# Patient Record
Sex: Female | Born: 1940
Health system: Southern US, Community
[De-identification: ages and names within clinical notes are randomized; demographics above are authoritative.]

## PROBLEM LIST (undated history)

## (undated) DIAGNOSIS — K449 Diaphragmatic hernia without obstruction or gangrene: Secondary | ICD-10-CM

## (undated) DIAGNOSIS — G35 Multiple sclerosis: Secondary | ICD-10-CM

## (undated) HISTORY — DX: Diaphragmatic hernia without obstruction or gangrene: K44.9

## (undated) HISTORY — PX: TONSILLECTOMY: SUR1361

## (undated) HISTORY — PX: TOTAL VAGINAL HYSTERECTOMY: SHX2548

## (undated) HISTORY — PX: WISDOM TOOTH EXTRACTION: SHX21

---

## 1973-01-17 HISTORY — PX: BREAST ENHANCEMENT SURGERY: SHX7

## 1986-01-17 DIAGNOSIS — G35 Multiple sclerosis: Secondary | ICD-10-CM

## 1986-01-17 HISTORY — DX: Multiple sclerosis: G35

## 1999-11-25 ENCOUNTER — Other Ambulatory Visit: Admission: RE | Admit: 1999-11-25 | Discharge: 1999-11-25 | Payer: Self-pay | Admitting: Obstetrics and Gynecology

## 2001-04-19 ENCOUNTER — Other Ambulatory Visit: Admission: RE | Admit: 2001-04-19 | Discharge: 2001-04-19 | Payer: Self-pay | Admitting: Obstetrics and Gynecology

## 2001-05-18 ENCOUNTER — Encounter: Payer: Self-pay | Admitting: Obstetrics and Gynecology

## 2001-05-18 ENCOUNTER — Ambulatory Visit (HOSPITAL_COMMUNITY): Admission: RE | Admit: 2001-05-18 | Discharge: 2001-05-18 | Payer: Self-pay | Admitting: Obstetrics and Gynecology

## 2001-06-22 ENCOUNTER — Encounter (INDEPENDENT_AMBULATORY_CARE_PROVIDER_SITE_OTHER): Payer: Self-pay | Admitting: Specialist

## 2001-06-22 ENCOUNTER — Ambulatory Visit (HOSPITAL_COMMUNITY): Admission: RE | Admit: 2001-06-22 | Discharge: 2001-06-22 | Payer: Self-pay | Admitting: Obstetrics and Gynecology

## 2002-06-04 ENCOUNTER — Other Ambulatory Visit: Admission: RE | Admit: 2002-06-04 | Discharge: 2002-06-04 | Payer: Self-pay | Admitting: Obstetrics and Gynecology

## 2003-09-15 ENCOUNTER — Other Ambulatory Visit: Admission: RE | Admit: 2003-09-15 | Discharge: 2003-09-15 | Payer: Self-pay | Admitting: Obstetrics and Gynecology

## 2004-10-05 ENCOUNTER — Other Ambulatory Visit: Admission: RE | Admit: 2004-10-05 | Discharge: 2004-10-05 | Payer: Self-pay | Admitting: Obstetrics and Gynecology

## 2007-11-29 ENCOUNTER — Encounter: Admission: RE | Admit: 2007-11-29 | Discharge: 2008-01-21 | Payer: Self-pay | Admitting: Internal Medicine

## 2008-01-21 ENCOUNTER — Encounter: Admission: RE | Admit: 2008-01-21 | Discharge: 2008-02-21 | Payer: Self-pay | Admitting: Internal Medicine

## 2010-06-01 ENCOUNTER — Other Ambulatory Visit (HOSPITAL_COMMUNITY): Payer: Self-pay | Admitting: Psychiatry

## 2010-06-01 DIAGNOSIS — G35 Multiple sclerosis: Secondary | ICD-10-CM

## 2010-06-04 NOTE — H&P (Signed)
[facility]  Patient:    Suzanne Kim, Suzanne Kim Visit Number: [id number] MRN:           Service Type: Attending:  Trevor Iha, M.D. Dictated by:   Trevor Iha, M.D. Adm. Date:  06/22/01                           History and Physical  DATE OF BIRTH:  1940/10/22.  HISTORY OF PRESENT ILLNESS:  The patient is a 70 year old ______ gravida white female who has a history of hysterectomy in 1991 for bleeding who had a routine annual examination that had pelvic fullness.  Underwent a ultrasound and CAT scan which showed bilateral ovarian cyst with cystic and solid components.  She had a normal CA-125.  The left adnexa shows a cyst that is 4.3 x 2.5 x 4.3 cm in size with a 2 x 1.3 x 1.4 cm cyst with cystic and solid components.  The right ovary measures 5 cm in size with a 2.8 had a 1.5 cm cyst.  Both have solid components.  No adnexal fluid is noted. the uterus is surgically missing.  The MRI is consistent with the ultrasound.  No pelvic nodes or other masses are noted.   Plan to proceed with laparoscopy for removal of the tubes and ovaries.  The patient has mostly had pressure symptoms from this.  PAST MEDICAL HISTORY: 1. Significant for lupus being treated by a doctor in [place].  She is on    Plaquenil, Buspar, Clonopin, detrol LA. 2. She also has a history of osteopenia currently on Evista.  PAST SURGICAL HISTORY: 1. Hysterectomy in 1991. 2. Breast implants in 1998.  PHYSICAL EXAMINATION:  VITAL SIGNS:  Blood pressure 112/70, weight 137.  HEART:  Regular rate and rhythm.  LUNGS:  Clear to auscultation bilaterally.  ABDOMEN:  Nondistended, nontender.  PELVIC:  Mild bilateral pelvic fullness.  No obvious masses are palpable.  IMPRESSION AND PLAN:  Bilateral ovarian cyst with cystic and solid components.  PLAN:  Laparoscopic bilateral salpingo-oophorectomy with pelvic washings.  We will plan to begin laparoscopically.  If it  does appear to be cancerous we will close and perform a laparotomy for staging with a GYN oncologist.  This has been discussed with Dr. Rande Brunt. Clarke-Pearson, [facility] oncologist, and feels that we can safely proceed laparoscopically at this time due to a normal CA-125 and the MRI and ultrasound.  The risks and benefits were discussED at length with the patient which included but are not limited to risk of infection, bleeding, damage to bowel, bladder, ureters, and also need for further surgery.  Risks associated with blood transfusion and also with anesthesia.  She does give her informed consent. Dictated by:   Trevor Iha, M.D. Attending:  Trevor Iha, M.D. DD:  06/21/01 TD:  06/21/01 Job: [id number]

## 2010-06-04 NOTE — Op Note (Signed)
[facility]  Patient:    Suzanne Kim, Suzanne Kim Visit Number: [id number] MRN: [id number]          Service Type: DSU Location: [facility] Attending Physician:  Trevor Iha Dictated by:   Trevor Iha, M.D. Proc. Date: 06/22/01 Admit Date:  06/22/2001 Discharge Date: 06/22/2001                             Operative Report  PREOPERATIVE DIAGNOSES:       Bilateral ovarian masses.  POSTOPERATIVE DIAGNOSES:      Bilateral ovarian papillary serous cyst adenofibromas.  PROCEDURE:                    Laparoscopic bilateral salpingo-oophorectomy.  SURGEON:                      Trevor Iha, M.D.  ANESTHESIA:                   General endotracheal.  INDICATIONS:                  Suzanne Kim is a 70 year old nulligravida white female history of hysterectomy in 1991 for bleeding who at a routine annual examination had pelvic fullness.  Underwent ultrasound and a CAT scan which showed bilateral ovarian cysts with cystic and solid components.  She had a normal CA-125.  She presents today for bilateral salpingo-oophorectomy.  Risks and benefits were discussed at length.  Informed consent was obtained.  The findings were bilateral ovarian cysts with papillary excrescences.  Frozen pathology shows bilateral papillary serous cyst adenofibromas.  Otherwise, had a normal appearing cul-de-sac, normal appearing appendix and liver.  DESCRIPTION OF PROCEDURE:     After adequate analgesia, the patient placed in the dorsal lithotomy position.  She is sterilely prepped and draped.  The bladder was sterilely drained.  A sponge stitch was placed in the vagina.  A 1 cm infraumbilical skin incision was made.  A Veress needle was inserted.  The abdomen was insufflated to dullness in percussion.  An 11 mm trocar was inserted.  A 5 mm trocar was inserted to the left of midline two fingerbreadths above the pubic symphysis and the above findings were noted. The Gyrus  bipolar scissors were used to endoscopically cauterize and dissect the right and left ovary and tube off the infundibulopelvic ligament, down to the pelvic side wall, and off the vaginal cuff with care taken to avoid the underlying ureter and bladder and achieve good hemostasis along the way.  After the ovaries were removed and the remaining pedicles were hemostatic, each ovary was individually placed in an endoscopic bag.  The 5 mm port was then enlarged to approximately 1.5 cm and the ovaries were morcellated while in the endoscopic bag and delivered through the abdominal incision.  They were sent for frozen pathology.  At this time after reexamination revealed good hemostasis, the trocars were then removed.  The fascia on the extended 5 mm ports were grasped with Allis clamps and closed with 0 Vicryl in the fascia, 3-0 Vicryl Rapide subcuticular stitch.  The infraumbilical skin incision was closed with a 0 Vicryl fascia and 3-0 Vicryl Rapide subcuticular stitch.  The incisions were injected with 0.25% Marcaine. Sponge stick was removed from the vagina.  The patient was stable on transfer to the recovery room.  Sponge and instrument counts were normal x3.  Frozen pathology,  again, was benign.  Pelvic washings had been obtained.  DISPOSITION:                  Patient will be discharged home with routine follow-up in the office in two to three weeks.  She is given a prescription for Motrin 800 mg and Vicodin #30.  CONDITION ON DISCHARGE:       Good. Dictated by:   Trevor Iha, M.D. Attending Physician:  Trevor Iha DD:  06/22/01 TD:  06/25/01 Job: [phone]

## 2010-06-21 ENCOUNTER — Ambulatory Visit (HOSPITAL_COMMUNITY)
Admission: RE | Admit: 2010-06-21 | Discharge: 2010-06-21 | Disposition: A | Discharge status: Home / Self Care | Payer: Medicare Other | Source: Ambulatory Visit | Attending: Psychiatry | Admitting: Psychiatry

## 2010-06-21 DIAGNOSIS — G35 Multiple sclerosis: Secondary | ICD-10-CM | POA: Insufficient documentation

## 2010-06-21 DIAGNOSIS — M519 Unspecified thoracic, thoracolumbar and lumbosacral intervertebral disc disorder: Secondary | ICD-10-CM | POA: Insufficient documentation

## 2010-06-21 DIAGNOSIS — M439 Deforming dorsopathy, unspecified: Secondary | ICD-10-CM | POA: Insufficient documentation

## 2010-06-21 DIAGNOSIS — M545 Low back pain, unspecified: Secondary | ICD-10-CM | POA: Insufficient documentation

## 2010-06-21 DIAGNOSIS — M5126 Other intervertebral disc displacement, lumbar region: Secondary | ICD-10-CM | POA: Insufficient documentation

## 2010-06-21 MED ORDER — GADOBENATE DIMEGLUMINE 529 MG/ML IV SOLN
10.0000 mL | Freq: Once | INTRAVENOUS | Status: AC
  Administered 2010-06-21: 10 mL via INTRAVENOUS

## 2010-06-22 ENCOUNTER — Other Ambulatory Visit (HOSPITAL_COMMUNITY): Payer: Self-pay

## 2011-01-24 DIAGNOSIS — H1044 Vernal conjunctivitis: Secondary | ICD-10-CM | POA: Diagnosis not present

## 2011-01-25 DIAGNOSIS — N3941 Urge incontinence: Secondary | ICD-10-CM | POA: Diagnosis not present

## 2011-01-25 DIAGNOSIS — R350 Frequency of micturition: Secondary | ICD-10-CM | POA: Diagnosis not present

## 2011-02-15 DIAGNOSIS — N3941 Urge incontinence: Secondary | ICD-10-CM | POA: Diagnosis not present

## 2011-02-15 DIAGNOSIS — R350 Frequency of micturition: Secondary | ICD-10-CM | POA: Diagnosis not present

## 2011-03-08 DIAGNOSIS — R350 Frequency of micturition: Secondary | ICD-10-CM | POA: Diagnosis not present

## 2011-03-08 DIAGNOSIS — N3941 Urge incontinence: Secondary | ICD-10-CM | POA: Diagnosis not present

## 2011-03-21 DIAGNOSIS — N8189 Other female genital prolapse: Secondary | ICD-10-CM | POA: Diagnosis not present

## 2011-03-24 DIAGNOSIS — H02829 Cysts of unspecified eye, unspecified eyelid: Secondary | ICD-10-CM | POA: Diagnosis not present

## 2011-03-28 DIAGNOSIS — N3941 Urge incontinence: Secondary | ICD-10-CM | POA: Diagnosis not present

## 2011-03-28 DIAGNOSIS — R350 Frequency of micturition: Secondary | ICD-10-CM | POA: Diagnosis not present

## 2011-03-29 DIAGNOSIS — M329 Systemic lupus erythematosus, unspecified: Secondary | ICD-10-CM | POA: Diagnosis not present

## 2011-03-29 DIAGNOSIS — M545 Low back pain: Secondary | ICD-10-CM | POA: Diagnosis not present

## 2011-03-29 DIAGNOSIS — J309 Allergic rhinitis, unspecified: Secondary | ICD-10-CM | POA: Diagnosis not present

## 2011-03-29 DIAGNOSIS — M1990 Unspecified osteoarthritis, unspecified site: Secondary | ICD-10-CM | POA: Diagnosis not present

## 2011-04-04 DIAGNOSIS — IMO0002 Reserved for concepts with insufficient information to code with codable children: Secondary | ICD-10-CM | POA: Diagnosis not present

## 2011-04-04 DIAGNOSIS — R262 Difficulty in walking, not elsewhere classified: Secondary | ICD-10-CM | POA: Diagnosis not present

## 2011-04-04 DIAGNOSIS — G35 Multiple sclerosis: Secondary | ICD-10-CM | POA: Diagnosis not present

## 2011-04-04 DIAGNOSIS — M545 Low back pain: Secondary | ICD-10-CM | POA: Diagnosis not present

## 2011-04-04 DIAGNOSIS — M439 Deforming dorsopathy, unspecified: Secondary | ICD-10-CM | POA: Diagnosis not present

## 2011-04-05 DIAGNOSIS — M439 Deforming dorsopathy, unspecified: Secondary | ICD-10-CM | POA: Diagnosis not present

## 2011-04-05 DIAGNOSIS — M545 Low back pain: Secondary | ICD-10-CM | POA: Diagnosis not present

## 2011-04-05 DIAGNOSIS — R262 Difficulty in walking, not elsewhere classified: Secondary | ICD-10-CM | POA: Diagnosis not present

## 2011-04-05 DIAGNOSIS — G35 Multiple sclerosis: Secondary | ICD-10-CM | POA: Diagnosis not present

## 2011-04-05 DIAGNOSIS — IMO0002 Reserved for concepts with insufficient information to code with codable children: Secondary | ICD-10-CM | POA: Diagnosis not present

## 2011-04-06 DIAGNOSIS — M439 Deforming dorsopathy, unspecified: Secondary | ICD-10-CM | POA: Diagnosis not present

## 2011-04-06 DIAGNOSIS — M545 Low back pain: Secondary | ICD-10-CM | POA: Diagnosis not present

## 2011-04-06 DIAGNOSIS — IMO0002 Reserved for concepts with insufficient information to code with codable children: Secondary | ICD-10-CM | POA: Diagnosis not present

## 2011-04-06 DIAGNOSIS — G35 Multiple sclerosis: Secondary | ICD-10-CM | POA: Diagnosis not present

## 2011-04-06 DIAGNOSIS — R262 Difficulty in walking, not elsewhere classified: Secondary | ICD-10-CM | POA: Diagnosis not present

## 2011-04-07 DIAGNOSIS — M545 Low back pain: Secondary | ICD-10-CM | POA: Diagnosis not present

## 2011-04-07 DIAGNOSIS — IMO0002 Reserved for concepts with insufficient information to code with codable children: Secondary | ICD-10-CM | POA: Diagnosis not present

## 2011-04-07 DIAGNOSIS — M439 Deforming dorsopathy, unspecified: Secondary | ICD-10-CM | POA: Diagnosis not present

## 2011-04-07 DIAGNOSIS — G35 Multiple sclerosis: Secondary | ICD-10-CM | POA: Diagnosis not present

## 2011-04-07 DIAGNOSIS — R262 Difficulty in walking, not elsewhere classified: Secondary | ICD-10-CM | POA: Diagnosis not present

## 2011-04-08 DIAGNOSIS — M439 Deforming dorsopathy, unspecified: Secondary | ICD-10-CM | POA: Diagnosis not present

## 2011-04-08 DIAGNOSIS — IMO0002 Reserved for concepts with insufficient information to code with codable children: Secondary | ICD-10-CM | POA: Diagnosis not present

## 2011-04-08 DIAGNOSIS — M545 Low back pain: Secondary | ICD-10-CM | POA: Diagnosis not present

## 2011-04-08 DIAGNOSIS — R262 Difficulty in walking, not elsewhere classified: Secondary | ICD-10-CM | POA: Diagnosis not present

## 2011-04-08 DIAGNOSIS — G35 Multiple sclerosis: Secondary | ICD-10-CM | POA: Diagnosis not present

## 2011-04-11 DIAGNOSIS — R262 Difficulty in walking, not elsewhere classified: Secondary | ICD-10-CM | POA: Diagnosis not present

## 2011-04-11 DIAGNOSIS — M545 Low back pain: Secondary | ICD-10-CM | POA: Diagnosis not present

## 2011-04-11 DIAGNOSIS — IMO0002 Reserved for concepts with insufficient information to code with codable children: Secondary | ICD-10-CM | POA: Diagnosis not present

## 2011-04-11 DIAGNOSIS — M439 Deforming dorsopathy, unspecified: Secondary | ICD-10-CM | POA: Diagnosis not present

## 2011-04-11 DIAGNOSIS — G35 Multiple sclerosis: Secondary | ICD-10-CM | POA: Diagnosis not present

## 2011-04-12 DIAGNOSIS — G35 Multiple sclerosis: Secondary | ICD-10-CM | POA: Diagnosis not present

## 2011-04-12 DIAGNOSIS — M545 Low back pain: Secondary | ICD-10-CM | POA: Diagnosis not present

## 2011-04-12 DIAGNOSIS — M439 Deforming dorsopathy, unspecified: Secondary | ICD-10-CM | POA: Diagnosis not present

## 2011-04-12 DIAGNOSIS — IMO0002 Reserved for concepts with insufficient information to code with codable children: Secondary | ICD-10-CM | POA: Diagnosis not present

## 2011-04-12 DIAGNOSIS — R262 Difficulty in walking, not elsewhere classified: Secondary | ICD-10-CM | POA: Diagnosis not present

## 2011-04-13 DIAGNOSIS — R262 Difficulty in walking, not elsewhere classified: Secondary | ICD-10-CM | POA: Diagnosis not present

## 2011-04-13 DIAGNOSIS — M545 Low back pain: Secondary | ICD-10-CM | POA: Diagnosis not present

## 2011-04-13 DIAGNOSIS — G35 Multiple sclerosis: Secondary | ICD-10-CM | POA: Diagnosis not present

## 2011-04-13 DIAGNOSIS — IMO0002 Reserved for concepts with insufficient information to code with codable children: Secondary | ICD-10-CM | POA: Diagnosis not present

## 2011-04-13 DIAGNOSIS — M439 Deforming dorsopathy, unspecified: Secondary | ICD-10-CM | POA: Diagnosis not present

## 2011-04-14 DIAGNOSIS — G35 Multiple sclerosis: Secondary | ICD-10-CM | POA: Diagnosis not present

## 2011-04-14 DIAGNOSIS — M545 Low back pain: Secondary | ICD-10-CM | POA: Diagnosis not present

## 2011-04-14 DIAGNOSIS — M439 Deforming dorsopathy, unspecified: Secondary | ICD-10-CM | POA: Diagnosis not present

## 2011-04-14 DIAGNOSIS — R262 Difficulty in walking, not elsewhere classified: Secondary | ICD-10-CM | POA: Diagnosis not present

## 2011-04-14 DIAGNOSIS — IMO0002 Reserved for concepts with insufficient information to code with codable children: Secondary | ICD-10-CM | POA: Diagnosis not present

## 2011-04-18 DIAGNOSIS — R262 Difficulty in walking, not elsewhere classified: Secondary | ICD-10-CM | POA: Diagnosis not present

## 2011-04-18 DIAGNOSIS — M439 Deforming dorsopathy, unspecified: Secondary | ICD-10-CM | POA: Diagnosis not present

## 2011-04-18 DIAGNOSIS — M545 Low back pain: Secondary | ICD-10-CM | POA: Diagnosis not present

## 2011-04-18 DIAGNOSIS — IMO0002 Reserved for concepts with insufficient information to code with codable children: Secondary | ICD-10-CM | POA: Diagnosis not present

## 2011-04-18 DIAGNOSIS — G35 Multiple sclerosis: Secondary | ICD-10-CM | POA: Diagnosis not present

## 2011-04-19 DIAGNOSIS — R350 Frequency of micturition: Secondary | ICD-10-CM | POA: Diagnosis not present

## 2011-04-19 DIAGNOSIS — N3941 Urge incontinence: Secondary | ICD-10-CM | POA: Diagnosis not present

## 2011-04-20 DIAGNOSIS — G35 Multiple sclerosis: Secondary | ICD-10-CM | POA: Diagnosis not present

## 2011-04-21 DIAGNOSIS — G35 Multiple sclerosis: Secondary | ICD-10-CM | POA: Diagnosis not present

## 2011-04-21 DIAGNOSIS — M545 Low back pain: Secondary | ICD-10-CM | POA: Diagnosis not present

## 2011-04-21 DIAGNOSIS — IMO0002 Reserved for concepts with insufficient information to code with codable children: Secondary | ICD-10-CM | POA: Diagnosis not present

## 2011-04-21 DIAGNOSIS — M439 Deforming dorsopathy, unspecified: Secondary | ICD-10-CM | POA: Diagnosis not present

## 2011-04-21 DIAGNOSIS — R262 Difficulty in walking, not elsewhere classified: Secondary | ICD-10-CM | POA: Diagnosis not present

## 2011-04-22 DIAGNOSIS — IMO0002 Reserved for concepts with insufficient information to code with codable children: Secondary | ICD-10-CM | POA: Diagnosis not present

## 2011-04-22 DIAGNOSIS — G35 Multiple sclerosis: Secondary | ICD-10-CM | POA: Diagnosis not present

## 2011-04-22 DIAGNOSIS — M545 Low back pain: Secondary | ICD-10-CM | POA: Diagnosis not present

## 2011-04-22 DIAGNOSIS — M439 Deforming dorsopathy, unspecified: Secondary | ICD-10-CM | POA: Diagnosis not present

## 2011-04-22 DIAGNOSIS — R262 Difficulty in walking, not elsewhere classified: Secondary | ICD-10-CM | POA: Diagnosis not present

## 2011-04-25 DIAGNOSIS — M545 Low back pain: Secondary | ICD-10-CM | POA: Diagnosis not present

## 2011-04-25 DIAGNOSIS — M439 Deforming dorsopathy, unspecified: Secondary | ICD-10-CM | POA: Diagnosis not present

## 2011-04-25 DIAGNOSIS — G35 Multiple sclerosis: Secondary | ICD-10-CM | POA: Diagnosis not present

## 2011-04-25 DIAGNOSIS — IMO0002 Reserved for concepts with insufficient information to code with codable children: Secondary | ICD-10-CM | POA: Diagnosis not present

## 2011-04-25 DIAGNOSIS — R262 Difficulty in walking, not elsewhere classified: Secondary | ICD-10-CM | POA: Diagnosis not present

## 2011-04-27 DIAGNOSIS — R262 Difficulty in walking, not elsewhere classified: Secondary | ICD-10-CM | POA: Diagnosis not present

## 2011-04-27 DIAGNOSIS — G35 Multiple sclerosis: Secondary | ICD-10-CM | POA: Diagnosis not present

## 2011-04-27 DIAGNOSIS — IMO0002 Reserved for concepts with insufficient information to code with codable children: Secondary | ICD-10-CM | POA: Diagnosis not present

## 2011-04-27 DIAGNOSIS — M439 Deforming dorsopathy, unspecified: Secondary | ICD-10-CM | POA: Diagnosis not present

## 2011-04-27 DIAGNOSIS — M545 Low back pain: Secondary | ICD-10-CM | POA: Diagnosis not present

## 2011-04-28 DIAGNOSIS — IMO0002 Reserved for concepts with insufficient information to code with codable children: Secondary | ICD-10-CM | POA: Diagnosis not present

## 2011-04-28 DIAGNOSIS — M439 Deforming dorsopathy, unspecified: Secondary | ICD-10-CM | POA: Diagnosis not present

## 2011-04-28 DIAGNOSIS — M545 Low back pain: Secondary | ICD-10-CM | POA: Diagnosis not present

## 2011-04-28 DIAGNOSIS — G35 Multiple sclerosis: Secondary | ICD-10-CM | POA: Diagnosis not present

## 2011-04-28 DIAGNOSIS — R262 Difficulty in walking, not elsewhere classified: Secondary | ICD-10-CM | POA: Diagnosis not present

## 2011-05-02 DIAGNOSIS — M439 Deforming dorsopathy, unspecified: Secondary | ICD-10-CM | POA: Diagnosis not present

## 2011-05-02 DIAGNOSIS — IMO0002 Reserved for concepts with insufficient information to code with codable children: Secondary | ICD-10-CM | POA: Diagnosis not present

## 2011-05-02 DIAGNOSIS — G35 Multiple sclerosis: Secondary | ICD-10-CM | POA: Diagnosis not present

## 2011-05-02 DIAGNOSIS — R262 Difficulty in walking, not elsewhere classified: Secondary | ICD-10-CM | POA: Diagnosis not present

## 2011-05-02 DIAGNOSIS — M545 Low back pain: Secondary | ICD-10-CM | POA: Diagnosis not present

## 2011-05-04 DIAGNOSIS — M810 Age-related osteoporosis without current pathological fracture: Secondary | ICD-10-CM | POA: Diagnosis not present

## 2011-05-04 DIAGNOSIS — Z79899 Other long term (current) drug therapy: Secondary | ICD-10-CM | POA: Diagnosis not present

## 2011-05-04 DIAGNOSIS — M1990 Unspecified osteoarthritis, unspecified site: Secondary | ICD-10-CM | POA: Diagnosis not present

## 2011-05-04 DIAGNOSIS — N318 Other neuromuscular dysfunction of bladder: Secondary | ICD-10-CM | POA: Diagnosis not present

## 2011-05-05 DIAGNOSIS — M545 Low back pain: Secondary | ICD-10-CM | POA: Diagnosis not present

## 2011-05-05 DIAGNOSIS — G35 Multiple sclerosis: Secondary | ICD-10-CM | POA: Diagnosis not present

## 2011-05-05 DIAGNOSIS — IMO0002 Reserved for concepts with insufficient information to code with codable children: Secondary | ICD-10-CM | POA: Diagnosis not present

## 2011-05-05 DIAGNOSIS — M439 Deforming dorsopathy, unspecified: Secondary | ICD-10-CM | POA: Diagnosis not present

## 2011-05-05 DIAGNOSIS — R262 Difficulty in walking, not elsewhere classified: Secondary | ICD-10-CM | POA: Diagnosis not present

## 2011-05-10 DIAGNOSIS — N3941 Urge incontinence: Secondary | ICD-10-CM | POA: Diagnosis not present

## 2011-05-11 DIAGNOSIS — M439 Deforming dorsopathy, unspecified: Secondary | ICD-10-CM | POA: Diagnosis not present

## 2011-05-11 DIAGNOSIS — IMO0002 Reserved for concepts with insufficient information to code with codable children: Secondary | ICD-10-CM | POA: Diagnosis not present

## 2011-05-11 DIAGNOSIS — R262 Difficulty in walking, not elsewhere classified: Secondary | ICD-10-CM | POA: Diagnosis not present

## 2011-05-11 DIAGNOSIS — M545 Low back pain: Secondary | ICD-10-CM | POA: Diagnosis not present

## 2011-05-11 DIAGNOSIS — G35 Multiple sclerosis: Secondary | ICD-10-CM | POA: Diagnosis not present

## 2011-05-12 DIAGNOSIS — IMO0002 Reserved for concepts with insufficient information to code with codable children: Secondary | ICD-10-CM | POA: Diagnosis not present

## 2011-05-12 DIAGNOSIS — M439 Deforming dorsopathy, unspecified: Secondary | ICD-10-CM | POA: Diagnosis not present

## 2011-05-12 DIAGNOSIS — R262 Difficulty in walking, not elsewhere classified: Secondary | ICD-10-CM | POA: Diagnosis not present

## 2011-05-12 DIAGNOSIS — G35 Multiple sclerosis: Secondary | ICD-10-CM | POA: Diagnosis not present

## 2011-05-12 DIAGNOSIS — M545 Low back pain: Secondary | ICD-10-CM | POA: Diagnosis not present

## 2011-05-31 DIAGNOSIS — R350 Frequency of micturition: Secondary | ICD-10-CM | POA: Diagnosis not present

## 2011-05-31 DIAGNOSIS — N3941 Urge incontinence: Secondary | ICD-10-CM | POA: Diagnosis not present

## 2011-06-14 DIAGNOSIS — M810 Age-related osteoporosis without current pathological fracture: Secondary | ICD-10-CM | POA: Diagnosis not present

## 2011-06-14 DIAGNOSIS — Z01419 Encounter for gynecological examination (general) (routine) without abnormal findings: Secondary | ICD-10-CM | POA: Diagnosis not present

## 2011-06-14 DIAGNOSIS — N8189 Other female genital prolapse: Secondary | ICD-10-CM | POA: Diagnosis not present

## 2011-06-21 DIAGNOSIS — N3941 Urge incontinence: Secondary | ICD-10-CM | POA: Diagnosis not present

## 2011-06-21 DIAGNOSIS — R350 Frequency of micturition: Secondary | ICD-10-CM | POA: Diagnosis not present

## 2011-08-02 DIAGNOSIS — R350 Frequency of micturition: Secondary | ICD-10-CM | POA: Diagnosis not present

## 2011-08-02 DIAGNOSIS — N3941 Urge incontinence: Secondary | ICD-10-CM | POA: Diagnosis not present

## 2011-08-16 DIAGNOSIS — N8111 Cystocele, midline: Secondary | ICD-10-CM | POA: Diagnosis not present

## 2011-08-23 DIAGNOSIS — N3941 Urge incontinence: Secondary | ICD-10-CM | POA: Diagnosis not present

## 2011-09-13 DIAGNOSIS — R350 Frequency of micturition: Secondary | ICD-10-CM | POA: Diagnosis not present

## 2011-09-13 DIAGNOSIS — N3941 Urge incontinence: Secondary | ICD-10-CM | POA: Diagnosis not present

## 2011-10-06 DIAGNOSIS — N3941 Urge incontinence: Secondary | ICD-10-CM | POA: Diagnosis not present

## 2011-10-06 DIAGNOSIS — R350 Frequency of micturition: Secondary | ICD-10-CM | POA: Diagnosis not present

## 2011-10-26 DIAGNOSIS — R350 Frequency of micturition: Secondary | ICD-10-CM | POA: Diagnosis not present

## 2011-10-26 DIAGNOSIS — N3941 Urge incontinence: Secondary | ICD-10-CM | POA: Diagnosis not present

## 2011-11-01 DIAGNOSIS — N8189 Other female genital prolapse: Secondary | ICD-10-CM | POA: Diagnosis not present

## 2011-11-08 DIAGNOSIS — G35 Multiple sclerosis: Secondary | ICD-10-CM | POA: Diagnosis not present

## 2011-11-09 DIAGNOSIS — Z23 Encounter for immunization: Secondary | ICD-10-CM | POA: Diagnosis not present

## 2011-11-09 DIAGNOSIS — G35 Multiple sclerosis: Secondary | ICD-10-CM | POA: Diagnosis not present

## 2011-11-09 DIAGNOSIS — M329 Systemic lupus erythematosus, unspecified: Secondary | ICD-10-CM | POA: Diagnosis not present

## 2011-11-09 DIAGNOSIS — Z79899 Other long term (current) drug therapy: Secondary | ICD-10-CM | POA: Diagnosis not present

## 2011-11-09 DIAGNOSIS — M545 Low back pain: Secondary | ICD-10-CM | POA: Diagnosis not present

## 2011-11-09 DIAGNOSIS — M810 Age-related osteoporosis without current pathological fracture: Secondary | ICD-10-CM | POA: Diagnosis not present

## 2011-11-09 DIAGNOSIS — J309 Allergic rhinitis, unspecified: Secondary | ICD-10-CM | POA: Diagnosis not present

## 2011-11-09 DIAGNOSIS — M1990 Unspecified osteoarthritis, unspecified site: Secondary | ICD-10-CM | POA: Diagnosis not present

## 2011-11-09 DIAGNOSIS — Z1331 Encounter for screening for depression: Secondary | ICD-10-CM | POA: Diagnosis not present

## 2011-11-16 DIAGNOSIS — N3941 Urge incontinence: Secondary | ICD-10-CM | POA: Diagnosis not present

## 2011-11-16 DIAGNOSIS — R350 Frequency of micturition: Secondary | ICD-10-CM | POA: Diagnosis not present

## 2011-12-07 DIAGNOSIS — N3941 Urge incontinence: Secondary | ICD-10-CM | POA: Diagnosis not present

## 2011-12-07 DIAGNOSIS — R350 Frequency of micturition: Secondary | ICD-10-CM | POA: Diagnosis not present

## 2011-12-28 DIAGNOSIS — N3941 Urge incontinence: Secondary | ICD-10-CM | POA: Diagnosis not present

## 2012-01-31 DIAGNOSIS — R350 Frequency of micturition: Secondary | ICD-10-CM | POA: Diagnosis not present

## 2012-01-31 DIAGNOSIS — N3941 Urge incontinence: Secondary | ICD-10-CM | POA: Diagnosis not present

## 2012-02-01 DIAGNOSIS — N8182 Incompetence or weakening of pubocervical tissue: Secondary | ICD-10-CM | POA: Diagnosis not present

## 2012-02-21 DIAGNOSIS — N3941 Urge incontinence: Secondary | ICD-10-CM | POA: Diagnosis not present

## 2012-03-13 DIAGNOSIS — N3941 Urge incontinence: Secondary | ICD-10-CM | POA: Diagnosis not present

## 2012-04-17 DIAGNOSIS — N3941 Urge incontinence: Secondary | ICD-10-CM | POA: Diagnosis not present

## 2012-04-17 DIAGNOSIS — R350 Frequency of micturition: Secondary | ICD-10-CM | POA: Diagnosis not present

## 2012-04-30 DIAGNOSIS — N8182 Incompetence or weakening of pubocervical tissue: Secondary | ICD-10-CM | POA: Diagnosis not present

## 2012-04-30 DIAGNOSIS — N952 Postmenopausal atrophic vaginitis: Secondary | ICD-10-CM | POA: Diagnosis not present

## 2012-05-09 DIAGNOSIS — IMO0002 Reserved for concepts with insufficient information to code with codable children: Secondary | ICD-10-CM | POA: Diagnosis not present

## 2012-05-09 DIAGNOSIS — M329 Systemic lupus erythematosus, unspecified: Secondary | ICD-10-CM | POA: Diagnosis not present

## 2012-05-09 DIAGNOSIS — G35 Multiple sclerosis: Secondary | ICD-10-CM | POA: Diagnosis not present

## 2012-05-09 DIAGNOSIS — Z23 Encounter for immunization: Secondary | ICD-10-CM | POA: Diagnosis not present

## 2012-05-09 DIAGNOSIS — J309 Allergic rhinitis, unspecified: Secondary | ICD-10-CM | POA: Diagnosis not present

## 2012-05-09 DIAGNOSIS — Z79899 Other long term (current) drug therapy: Secondary | ICD-10-CM | POA: Diagnosis not present

## 2012-05-09 DIAGNOSIS — N318 Other neuromuscular dysfunction of bladder: Secondary | ICD-10-CM | POA: Diagnosis not present

## 2012-05-09 DIAGNOSIS — M1990 Unspecified osteoarthritis, unspecified site: Secondary | ICD-10-CM | POA: Diagnosis not present

## 2012-05-09 DIAGNOSIS — M545 Low back pain: Secondary | ICD-10-CM | POA: Diagnosis not present

## 2012-05-15 DIAGNOSIS — R350 Frequency of micturition: Secondary | ICD-10-CM | POA: Diagnosis not present

## 2012-05-15 DIAGNOSIS — N3941 Urge incontinence: Secondary | ICD-10-CM | POA: Diagnosis not present

## 2012-05-29 DIAGNOSIS — H0019 Chalazion unspecified eye, unspecified eyelid: Secondary | ICD-10-CM | POA: Diagnosis not present

## 2012-06-05 DIAGNOSIS — R350 Frequency of micturition: Secondary | ICD-10-CM | POA: Diagnosis not present

## 2012-06-05 DIAGNOSIS — N3941 Urge incontinence: Secondary | ICD-10-CM | POA: Diagnosis not present

## 2012-06-18 DIAGNOSIS — Z01419 Encounter for gynecological examination (general) (routine) without abnormal findings: Secondary | ICD-10-CM | POA: Diagnosis not present

## 2012-06-27 DIAGNOSIS — N3941 Urge incontinence: Secondary | ICD-10-CM | POA: Diagnosis not present

## 2012-06-27 DIAGNOSIS — R350 Frequency of micturition: Secondary | ICD-10-CM | POA: Diagnosis not present

## 2012-07-10 DIAGNOSIS — M25529 Pain in unspecified elbow: Secondary | ICD-10-CM | POA: Diagnosis not present

## 2012-07-10 DIAGNOSIS — M1990 Unspecified osteoarthritis, unspecified site: Secondary | ICD-10-CM | POA: Diagnosis not present

## 2012-07-10 DIAGNOSIS — IMO0002 Reserved for concepts with insufficient information to code with codable children: Secondary | ICD-10-CM | POA: Diagnosis not present

## 2012-07-10 DIAGNOSIS — G35 Multiple sclerosis: Secondary | ICD-10-CM | POA: Diagnosis not present

## 2012-09-11 DIAGNOSIS — R350 Frequency of micturition: Secondary | ICD-10-CM | POA: Diagnosis not present

## 2012-09-11 DIAGNOSIS — N3941 Urge incontinence: Secondary | ICD-10-CM | POA: Diagnosis not present

## 2012-10-01 DIAGNOSIS — N8182 Incompetence or weakening of pubocervical tissue: Secondary | ICD-10-CM | POA: Diagnosis not present

## 2012-10-02 DIAGNOSIS — R350 Frequency of micturition: Secondary | ICD-10-CM | POA: Diagnosis not present

## 2012-10-02 DIAGNOSIS — N3941 Urge incontinence: Secondary | ICD-10-CM | POA: Diagnosis not present

## 2012-10-23 DIAGNOSIS — N3941 Urge incontinence: Secondary | ICD-10-CM | POA: Diagnosis not present

## 2012-11-07 ENCOUNTER — Encounter (HOSPITAL_COMMUNITY): Payer: Self-pay | Admitting: Emergency Medicine

## 2012-11-07 ENCOUNTER — Emergency Department (INDEPENDENT_AMBULATORY_CARE_PROVIDER_SITE_OTHER)
Admission: EM | Admit: 2012-11-07 | Discharge: 2012-11-07 | Disposition: A | Discharge status: Home / Self Care | Payer: Medicare Other | Source: Home / Self Care | Attending: Family Medicine | Admitting: Family Medicine

## 2012-11-07 DIAGNOSIS — J302 Other seasonal allergic rhinitis: Secondary | ICD-10-CM

## 2012-11-07 DIAGNOSIS — J309 Allergic rhinitis, unspecified: Secondary | ICD-10-CM

## 2012-11-07 HISTORY — DX: Multiple sclerosis: G35

## 2012-11-07 MED ORDER — CETIRIZINE HCL 10 MG PO TABS: 10.0000 mg | ORAL_TABLET | Freq: Every day | ORAL | Status: DC

## 2012-11-07 MED ORDER — FLUTICASONE PROPIONATE 50 MCG/ACT NA SUSP: 2.0000 | Freq: Two times a day (BID) | NASAL | Status: DC

## 2012-11-07 NOTE — ED Provider Notes (Signed)
CSN: [id number]     Arrival date & time 11/07/12  1608 History   First MD Initiated Contact with Patient 11/07/12 1704     Chief Complaint  Patient presents with  . Dizziness   (Consider location/radiation/quality/duration/timing/severity/associated sxs/prior Treatment) Patient is a 72 y.o. female presenting with URI. The history is provided by the patient.  URI Presenting symptoms: congestion, cough and rhinorrhea   Presenting symptoms: no fever and no sore throat   Severity:  Mild Onset quality:  Gradual Duration:  1 week Progression:  Unchanged Chronicity:  New Associated symptoms: sneezing   Associated symptoms: no wheezing     Past Medical History  Diagnosis Date  . Multiple sclerosis    History reviewed. No pertinent past surgical history. History reviewed. No pertinent family history. History  Substance Use Topics  . Smoking status: Never Smoker   . Smokeless tobacco: Not on file  . Alcohol Use: No   OB History   Grav Para Term Preterm Abortions TAB SAB Ect Mult Living                 Review of Systems  Constitutional: Negative.  Negative for fever.  HENT: Positive for congestion, postnasal drip, rhinorrhea and sneezing. Negative for sore throat.   Respiratory: Positive for cough. Negative for wheezing.     Allergies  Review of patient's allergies indicates no known allergies.  Home Medications   Current Outpatient Rx  Name  Route  Sig  Dispense  Refill  . cetirizine (ZYRTEC) 10 MG tablet   Oral   Take 1 tablet (10 mg total) by mouth daily. One tab daily for allergies   30 tablet   1   . fluticasone (FLONASE) 50 MCG/ACT nasal spray   Nasal   Place 2 sprays into the nose 2 (two) times daily.   1 g   2    BP 168/74  Pulse 66  Temp(Src) 98.8 F (37.1 C) (Oral)  Resp 16  SpO2 100% Physical Exam  Nursing note and vitals reviewed. Constitutional: She is oriented to person, place, and time. She appears well-developed and well-nourished.  HENT:   Head: Normocephalic.  Right Ear: External ear normal.  Left Ear: External ear normal.  Nose: Mucosal edema and rhinorrhea present. No epistaxis.  Mouth/Throat: Oropharynx is clear and moist.  Neck: Normal range of motion. Neck supple.  Cardiovascular: Normal rate, regular rhythm, normal heart sounds and intact distal pulses.   Pulmonary/Chest: Effort normal and breath sounds normal.  Lymphadenopathy:    She has no cervical adenopathy.  Neurological: She is alert and oriented to person, place, and time.  Skin: Skin is warm and dry.    ED Course  Procedures (including critical care time) Labs Review Labs Reviewed - No data to display Imaging Review No results found.    MDM      Linna Hoff, MD 11/08/12 [phone]

## 2012-11-07 NOTE — ED Notes (Signed)
History of MS, has a Rx from her MD in [place], who had her take steroids every time her gait was off. States she has had c/o from her family that on the phone, she sounds as if she has a cold or she had been crying; concerned she may have a sinus problem

## 2012-11-15 DIAGNOSIS — Z79899 Other long term (current) drug therapy: Secondary | ICD-10-CM | POA: Diagnosis not present

## 2012-11-15 DIAGNOSIS — M810 Age-related osteoporosis without current pathological fracture: Secondary | ICD-10-CM | POA: Diagnosis not present

## 2012-11-15 DIAGNOSIS — Z23 Encounter for immunization: Secondary | ICD-10-CM | POA: Diagnosis not present

## 2012-11-15 DIAGNOSIS — J309 Allergic rhinitis, unspecified: Secondary | ICD-10-CM | POA: Diagnosis not present

## 2012-11-15 DIAGNOSIS — M1990 Unspecified osteoarthritis, unspecified site: Secondary | ICD-10-CM | POA: Diagnosis not present

## 2012-11-15 DIAGNOSIS — G35 Multiple sclerosis: Secondary | ICD-10-CM | POA: Diagnosis not present

## 2012-11-15 DIAGNOSIS — M545 Low back pain: Secondary | ICD-10-CM | POA: Diagnosis not present

## 2012-11-15 DIAGNOSIS — M329 Systemic lupus erythematosus, unspecified: Secondary | ICD-10-CM | POA: Diagnosis not present

## 2012-11-15 DIAGNOSIS — N318 Other neuromuscular dysfunction of bladder: Secondary | ICD-10-CM | POA: Diagnosis not present

## 2012-12-04 DIAGNOSIS — N3941 Urge incontinence: Secondary | ICD-10-CM | POA: Diagnosis not present

## 2012-12-04 DIAGNOSIS — R350 Frequency of micturition: Secondary | ICD-10-CM | POA: Diagnosis not present

## 2012-12-19 ENCOUNTER — Encounter: Payer: Self-pay | Admitting: Neurology

## 2012-12-19 ENCOUNTER — Ambulatory Visit (INDEPENDENT_AMBULATORY_CARE_PROVIDER_SITE_OTHER): Payer: Medicare Other | Admitting: Neurology

## 2012-12-19 VITALS — BP 140/80 | HR 70 | Temp 98.1°F | Wt 116.0 lb

## 2012-12-19 DIAGNOSIS — G35 Multiple sclerosis: Secondary | ICD-10-CM | POA: Diagnosis not present

## 2012-12-19 NOTE — Progress Notes (Signed)
NEUROLOGY CONSULTATION NOTE  Suzanne Kim MRN: [id number] DOB: [birth year]  Referring provider: Dr. Wylene Simmer Primary care provider: Dr. Wylene Simmer  Reason for consult:  Multiple sclerosis.  HISTORY OF PRESENT ILLNESS: Suzanne Kim is a 72 year old right-handed woman with history of osteoporosis and multiple sclerosis, who presents to establish care for multiple sclerosis.  Records and images were personally reviewed where available.    Onset occurred at age 45.  She describes her initial symptoms as a "gray cloud slowly cover(ing) my vision horizontally".  She would experience these episodes occasionally over the next several years.  In 1986, she had an attack described as numbness from her chest down, as well as weakness in the lower extremities.  At this time, she was evaluated by two neurologists, Dr. Ninetta Lights and Dr. Sandria Manly.  She was told that she did not have MS based on findings on MRI.  The following year, she began stubbing her right toe often.  This progressed to increased difficulty walking, mostly in her right leg, as well as right foot drop.  In 1988, she then saw another neurologist, Dr. Dorina Hoyer, who told her he thought she did have MS, but did not recommend invasive testing, such as an LP.  She was never started on an immunomodulatory agent.  She was prescribed Symatril for fatigue and followed up with Dr. Freida Busman until 1991, when her care was transferred to Dr. Anne Hahn.  Various blood work was ordered, which revealed an elevated ANA of 1280.  [facility]  She underwent multiple tests, including MRI, CT and blood work, and was formally diagnosed with lupus.  She was treated for lupus with Plaquenil from 1991 to 1999.  As her walking continued to progressively worsen, she was Dr. Elwyn Lade at the [facility].  He suspected primary progressive MS. She did finally undergo an LP, as well as MRI.  Results were "inconclusive" but  still diagnosed her with primary progressive MS.  He offered to try Copaxone, but since it is not FDA-approved for PPMS, she would have to pay out of pocket, so she declined.  Regarding the diagnosis of lupus, she was evaluated by two other rheumatologists in [place], who doubted the diagnosis and workup reportedly did not suggest a connective tissue disease.  She eventually had been under the care of Dr. Leotis Shames, an MS specialist at [facility], who has since moved his practice.  Unfortunately, I do not have the results of these tests, such as the CSF or MRI.  Since that time, she has had baseline weakness involving the lower extremities.  Her most recent baseline from one year ago (11/08/11) notes she is ambulatory with a walker, however records note she had been wheelchair-bound prior.  She has assisted devices at home, including a stair lift.  She wears a brace on the right ankle due to a foot drop.  Her graded strength of her iliopsoas muscles at that time were 2/5.  She is not currently on immunomodulatory therapy.  She does take Baclofen 20mg  qhs for leg cramps.  She was taking calcium and vitamin D supplements, but not compliant.  She has history of back pain and degenerative disc disease, as well as osteoarthritis of the hips.  MRI of lumbar spine with and without contrast was performed on 06/21/10, which revealed mild degenerative changes of the discs and facet joints, but no central or neural foraminal stenosis.  She has been treated for  pain in the past with lumbar epidural steroid injections, which were ineffective.  Celebrex and hydrocodone have been ineffective.   She has done quite well with PT in the past.  However, due to her back pain, she has not been performing her home leg exercises.  PAST MEDICAL HISTORY: Past Medical History  Diagnosis Date  . Multiple sclerosis 1988    PAST SURGICAL HISTORY: Past Surgical History  Procedure Laterality Date  . Total vaginal hysterectomy    .  Breast enhancement surgery  1975  . Tonsillectomy      MEDICATIONS: Current Outpatient Prescriptions on File Prior to Visit  Medication Sig Dispense Refill  . cetirizine (ZYRTEC) 10 MG tablet Take 1 tablet (10 mg total) by mouth daily. One tab daily for allergies  30 tablet  1  . fluticasone (FLONASE) 50 MCG/ACT nasal spray Place 2 sprays into the nose 2 (two) times daily.  1 g  2   No current facility-administered medications on file prior to visit.    ALLERGIES: No Known Allergies  FAMILY HISTORY: No family history on file.  SOCIAL HISTORY: History   Social History  . Marital Status: Married    Spouse Name: N/A    Number of Children: N/A  . Years of Education: N/A   Occupational History  . Not on file.   Social History Main Topics  . Smoking status: Never Smoker   . Smokeless tobacco: Never Used  . Alcohol Use: No  . Drug Use: No  . Sexual Activity: Not on file   Other Topics Concern  . Not on file   Social History Narrative  . No narrative on file    REVIEW OF SYSTEMS: Constitutional: No fevers, chills, or sweats, no generalized fatigue, change in appetite Eyes: No visual changes, double vision, eye pain Ear, nose and throat: No hearing loss, ear pain, nasal congestion, sore throat Cardiovascular: No chest pain, palpitations Respiratory:  No shortness of breath at rest or with exertion, wheezes GastrointestinaI: No nausea, vomiting, diarrhea, abdominal pain, fecal incontinence Genitourinary:  No dysuria, urinary retention or frequency Musculoskeletal:  No neck pain, back pain Integumentary: No rash, pruritus, skin lesions Neurological: as above Psychiatric: No depression, insomnia, anxiety Endocrine: No palpitations, fatigue, diaphoresis, mood swings, change in appetite, change in weight, increased thirst Hematologic/Lymphatic:  No anemia, purpura, petechiae. Allergic/Immunologic: no itchy/runny eyes, nasal congestion, recent allergic reactions,  rashes  PHYSICAL EXAM: Filed Vitals:   12/19/12 1438  BP: 140/80  Pulse: 70  Temp: 98.1 F (36.7 C)   General: No acute distress Head:  Normocephalic/atraumatic Neck: supple, no paraspinal tenderness, full range of motion Back: No paraspinal tenderness Heart: regular rate and rhythm Lungs: Clear to auscultation bilaterally. Vascular: No carotid bruits. Neurological Exam: Mental status: alert and oriented to person, place, and time, speech fluent and not dysarthric, language intact. Cranial nerves: CN I: not tested CN II: pupils equal, round and reactive to light, visual fields intact, fundi unremarkable. CN III, IV, VI:  full range of motion, no nystagmus, no ptosis CN V: facial sensation intact CN VII: upper and lower face symmetric CN VIII: hearing intact CN IX, X: gag intact, uvula midline CN XI: sternocleidomastoid and trapezius muscles intact CN XII: tongue midline Bulk & Tone: normal, no fasciculations. Motor: 2/5 right hip flexion, 3/5 right knee extension and flexion, right foot drop, 3/5 left hip flexion, 5/5 left knee flexion/extension and ankle dorsiflexion.  5/5 in upper extremities. Sensation: pinprick intact.  Reduced vibration in fingers. Deep Tendon  Reflexes: 2+ throughout except 3+ left patellar, toes down Finger to nose testing: no dysmetria Gait: In wheelchair and cannot ambulate without walker which is not available.  IMPRESSION: Based on history, probably secondary progressive multiple sclerosis.  PLAN: 1.  Advised to restart home exercises of her legs. 2.  Will try and have prior MRI images sent over for review. 3.  Check vitamin D level 4.  Follow up in 3 months.  45 minutes spent with patient, over 50% spent counseling and coordinating care.  Thank you for allowing me to take part in the care of this patient.  Shon Millet, DO  CC:   Guerry Bruin, MD

## 2012-12-19 NOTE — Patient Instructions (Addendum)
1.  Start doing the leg exercises again. 2.  Bring in the films from home 3.  We will try and get the latest MRIs performed. 4.  We will check vitamin D level. 5.  Follow up in 3 months.

## 2012-12-20 ENCOUNTER — Telehealth: Payer: Self-pay | Admitting: Neurology

## 2012-12-20 NOTE — Telephone Encounter (Signed)
Spoke with the patient. Information given as per Dr. Everlena Cooper below. Will start the OTC Vit D3 supplements as recommended. No additional questions at this time.

## 2012-12-20 NOTE — Telephone Encounter (Signed)
Message copied by Benay Spice on Thu Dec 20, 2012  1:08 PM ------      Message from: JAFFE, ADAM R      Created: Thu Dec 20, 2012  5:56 AM       Ms. Skidgel Vit D level is low.  I recommend starting D3 4000 IU daily.      ----- Message -----         From: [facility] Interface         Sent: 12/20/2012   1:56 AM           To: Cira Servant, DO                   ------

## 2012-12-20 NOTE — Telephone Encounter (Signed)
Left the patient a vm message to return my call.

## 2013-01-01 DIAGNOSIS — N8110 Cystocele, unspecified: Secondary | ICD-10-CM | POA: Diagnosis not present

## 2013-03-19 ENCOUNTER — Ambulatory Visit: Payer: PRIVATE HEALTH INSURANCE | Admitting: Neurology

## 2013-04-01 DIAGNOSIS — N8110 Cystocele, unspecified: Secondary | ICD-10-CM | POA: Diagnosis not present

## 2013-04-09 ENCOUNTER — Ambulatory Visit (INDEPENDENT_AMBULATORY_CARE_PROVIDER_SITE_OTHER): Payer: Medicare Other | Admitting: Neurology

## 2013-04-09 ENCOUNTER — Encounter: Payer: Self-pay | Admitting: Neurology

## 2013-04-09 VITALS — BP 104/66 | HR 68 | Resp 18 | Ht 63.0 in | Wt 113.5 lb

## 2013-04-09 DIAGNOSIS — G35 Multiple sclerosis: Secondary | ICD-10-CM | POA: Diagnosis not present

## 2013-04-09 MED ORDER — CYCLOBENZAPRINE HCL 5 MG PO TABS: 5.0000 mg | ORAL_TABLET | Freq: Every evening | ORAL | Status: DC | PRN

## 2013-04-09 NOTE — Patient Instructions (Signed)
1.  For muscle spasms, try cyclobenzaprine 5mg , 1 tablet at bedtime as needed.  Stop Baclofen. 2.  Follow up in 3 months.

## 2013-04-09 NOTE — Progress Notes (Signed)
NEUROLOGY FOLLOW UP OFFICE NOTE  Suzanne Kim [id number]  HISTORY OF PRESENT ILLNESS: Suzanne LandauDianna Kim is a 73 year old right-handed woman with history of osteoporosis and multiple sclerosis, who follows up for secondary progressive multiple sclerosis.  Records and images were personally reviewed where available.    UPDATE: Advised to restart home exercises of her legs.  Vit D, 25-hydroxy level from 12/19/12 was 21.  She started D3 4000 IU daily.  Since last visit, I reviewed the MRI scans of the brain and cervical spine from 08/31/92.  They appear unremarkable.  There are no areas of signal abnormality to suggest demyelinating disease.  She had a fall in October 2014.  She says she has felt a little weaker overall since then.  No other falls.  It is very difficult for her to sleep at night due to back and hip pain.  She has significant scoliosis.  She takes baclofen for muscle spasms, which only mildly helps. She uses a walker in the house, wheelchair outside.  She also reports reduced appetite.  She says she lost 13 lbs since September.  HISTORY: Onset occurred at age 73.  She describes her initial symptoms as a "gray cloud slowly cover(ing) my vision horizontally".  She would experience these episodes occasionally over the next several years.  In 1986, she had an attack described as numbness from her chest down, as well as weakness in the lower extremities.  At this time, she was evaluated by two neurologists, Dr. [name] and Dr. [name].  She was told that she did not have MS based on findings on MRI.  The following year, she began stubbing her right toe often.  This progressed to increased difficulty walking, mostly in her right leg, as well as right foot drop.  In 1988, she then saw another neurologist, Dr. [name], who told her he thought she did have MS, but did not recommend invasive testing, such as an LP. She was never started on an immunomodulatory agent.  She was prescribed Symatril  for fatigue and followed up with Dr. Freida BusmanStiffel until 1991, when her care was transferred to Dr. [name].  Various blood work was ordered, which revealed an elevated ANA of 1280.  She [name]  She underwent multiple tests, including MRI, CT and blood work, and was formally diagnosed with lupus.  She was treated for lupus with Plaquenil from 1991 to 1999.  As her walking continued to progressively worsen, she was [facility] Elwyn [facility].  He suspected primary progressive MS. She did finally undergo an LP, as well as MRI.  Results were "inconclusive" but still diagnosed her with primary progressive MS.  He offered to try Copaxone, but since it is not FDA-approved for PPMS, she would have to pay out of pocket, so she declined.  Regarding the diagnosis of lupus, she was evaluated by two other rheumatologists in [place], who doubted the diagnosis and workup reportedly did not suggest a connective tissue disease.  She eventually had been under the care of Dr. [name], an MS specialist at [facility], who has since moved his practice.  Unfortunately, I do not have the results of these tests, such as the CSF.  She reportedly had VEPs which were positive for optic neuropathy.  Since that time, she has had baseline weakness involving the lower extremities.  Her most recent baseline from one year ago (11/08/11) notes she is ambulatory with a walker, however  records note she had been wheelchair-bound prior.  She has assisted devices at home, including a stair lift.  She wears a brace on the right ankle due to a foot drop.  Her graded strength of her iliopsoas muscles at that time were 2/5.  She is not currently on immunomodulatory therapy.  She does take Baclofen 20mg  qhs for leg cramps.  She was taking calcium and vitamin D supplements, but not compliant.  She has history of back pain and degenerative disc disease, as well as osteoarthritis of the hips.  MRI of  lumbar spine with and without contrast was performed on 06/21/10, which revealed mild degenerative changes of the discs and facet joints, but no central or neural foraminal stenosis.  She has been treated for pain in the past with lumbar epidural steroid injections, which were ineffective.  Celebrex and hydrocodone have been ineffective.   She has done quite well with PT in the past.  However, due to her back pain, she has not been performing her home leg exercises.  PAST MEDICAL HISTORY: Past Medical History  Diagnosis Date  . Multiple sclerosis 1988    MEDICATIONS: Current Outpatient Prescriptions on File Prior to Visit  Medication Sig Dispense Refill  . raloxifene (EVISTA) 60 MG tablet Take 60 mg by mouth daily.      . cetirizine (ZYRTEC) 10 MG tablet Take 1 tablet (10 mg total) by mouth daily. One tab daily for allergies  30 tablet  1  . fluticasone (FLONASE) 50 MCG/ACT nasal spray Place 2 sprays into the nose 2 (two) times daily.  1 g  2   No current facility-administered medications on file prior to visit.    ALLERGIES: No Known Allergies  FAMILY HISTORY: Family History  Problem Relation Age of Onset  . Ataxia Neg Hx   . Chorea Neg Hx   . Dementia Neg Hx   . Mental retardation Neg Hx   . Migraines Neg Hx   . Multiple sclerosis Neg Hx   . Neurofibromatosis Neg Hx   . Neuropathy Neg Hx   . Parkinsonism Neg Hx   . Seizures Neg Hx   . Stroke Neg Hx     SOCIAL HISTORY: History   Social History  . Marital Status: Married    Spouse Name: N/A    Number of Children: N/A  . Years of Education: N/A   Occupational History  . Not on file.   Social History Main Topics  . Smoking status: Never Smoker   . Smokeless tobacco: Never Used  . Alcohol Use: No  . Drug Use: No  . Sexual Activity: Not on file   Other Topics Concern  . Not on file   Social History Narrative  . No narrative on file    REVIEW OF SYSTEMS: Constitutional: No fevers, chills, or sweats, no  generalized fatigue, change in appetite Eyes: No visual changes, double vision, eye pain Ear, nose and throat: No hearing loss, ear pain, nasal congestion, sore throat Cardiovascular: No chest pain, palpitations Respiratory:  No shortness of breath at rest or with exertion, wheezes GastrointestinaI: No nausea, vomiting, diarrhea, abdominal pain, fecal incontinence Genitourinary:  No dysuria, urinary retention or frequency Musculoskeletal:  No neck pain, back pain Integumentary: No rash, pruritus, skin lesions Neurological: as above Psychiatric: No depression, insomnia, anxiety Endocrine: No palpitations, fatigue, diaphoresis, mood swings, change in appetite, change in weight, increased thirst Hematologic/Lymphatic:  No anemia, purpura, petechiae. Allergic/Immunologic: no itchy/runny eyes, nasal congestion, recent allergic reactions, rashes  PHYSICAL EXAM: Filed Vitals:   04/09/13 1515  BP: 104/66  Pulse: 68  Resp: 18   General: No acute distress Head:  Normocephalic/atraumatic Neck: supple, no paraspinal tenderness, full range of motion Heart:  Regular rate and rhythm Lungs:  Clear to auscultation bilaterally Back: No paraspinal tenderness Neurological Exam: alert and oriented to person, place, and time. Attention span and concentration intact, recent and remote memory intact, fund of knowledge intact.  Speech fluent and not dysarthric, language intact.  CN II-XII intact. Fundoscopic exam unremarkable without vessel changes, exudates, hemorrhages or papilledema.  Bulk and tone normal, muscle strength 2/5 right hip flexion, 3/5 right knee extension and flexion, right foot drop, 3/5 left hip flexion, 5-/5 left knee flexion/extension and ankle dorsiflexion. 5/5 in upper extremities.  Sensation to light touch, temperature and vibration intact.  Deep tendon reflexes 2+ throughout, toes downgoing.  Finger to nose and heel to shin testing intact.  Difficult to assess gait, cannot stand for too  long without walker, Romberg positive.  IMPRESSION: Clinically sounds like secondary progressive MS, stable  PLAN: 1.  Stable so would not initiate any immunomodulating agent. 2.  Continue D3 4000 IU daily.  Recheck level next visit. 3.  For muscle spasms, stop baclofen and try cyclobenzaprine 5mg  at bedtime as needed. 4.  Discuss with PCP about decreased appetite.  Possibly Remeron? 5.  Follow up in 3 months.  30 minutes spent with patient, over 50% spent counseling, coordinating care and reviewing old MRI scans.  Shon Millet, DO  CC:  Guerry Bruin, MD

## 2013-06-25 DIAGNOSIS — N8110 Cystocele, unspecified: Secondary | ICD-10-CM | POA: Diagnosis not present

## 2013-07-02 DIAGNOSIS — N318 Other neuromuscular dysfunction of bladder: Secondary | ICD-10-CM | POA: Diagnosis not present

## 2013-07-02 DIAGNOSIS — E559 Vitamin D deficiency, unspecified: Secondary | ICD-10-CM | POA: Diagnosis not present

## 2013-07-02 DIAGNOSIS — M545 Low back pain, unspecified: Secondary | ICD-10-CM | POA: Diagnosis not present

## 2013-07-02 DIAGNOSIS — Z79899 Other long term (current) drug therapy: Secondary | ICD-10-CM | POA: Diagnosis not present

## 2013-07-02 DIAGNOSIS — G35 Multiple sclerosis: Secondary | ICD-10-CM | POA: Diagnosis not present

## 2013-07-02 DIAGNOSIS — M810 Age-related osteoporosis without current pathological fracture: Secondary | ICD-10-CM | POA: Diagnosis not present

## 2013-07-02 DIAGNOSIS — M329 Systemic lupus erythematosus, unspecified: Secondary | ICD-10-CM | POA: Diagnosis not present

## 2013-07-02 DIAGNOSIS — Z23 Encounter for immunization: Secondary | ICD-10-CM | POA: Diagnosis not present

## 2013-07-10 ENCOUNTER — Ambulatory Visit: Payer: Medicare Other | Admitting: Neurology

## 2013-07-15 ENCOUNTER — Ambulatory Visit (INDEPENDENT_AMBULATORY_CARE_PROVIDER_SITE_OTHER): Payer: Medicare Other | Admitting: Neurology

## 2013-07-15 ENCOUNTER — Telehealth: Payer: Self-pay | Admitting: *Deleted

## 2013-07-15 ENCOUNTER — Encounter: Payer: Self-pay | Admitting: Neurology

## 2013-07-15 VITALS — BP 136/80 | HR 74 | Ht 63.0 in

## 2013-07-15 DIAGNOSIS — N319 Neuromuscular dysfunction of bladder, unspecified: Secondary | ICD-10-CM

## 2013-07-15 DIAGNOSIS — G35 Multiple sclerosis: Secondary | ICD-10-CM

## 2013-07-15 DIAGNOSIS — M62838 Other muscle spasm: Secondary | ICD-10-CM | POA: Diagnosis not present

## 2013-07-15 MED ORDER — CYCLOBENZAPRINE HCL 10 MG PO TABS: ORAL_TABLET | ORAL | Status: DC

## 2013-07-15 NOTE — Progress Notes (Signed)
NEUROLOGY FOLLOW UP OFFICE NOTE  Fanny SkatesDianna C Metzer [id number]  HISTORY OF PRESENT ILLNESS: Suzanne LandauDianna Kim is a 73 year old right-handed woman with history of osteoporosis and multiple sclerosis, who follows up for secondary progressive multiple sclerosis.    UPDATE: She reports muscle spasms in the legs, from arch of foot up back of leg to the hip on the right, and from the ankle up the back of leg to the hip on the left.  Cyclobenzaprine 5mg  at bedtime not effective, but possibly better than Baclofen.  She continues to have overactive bladder.    HISTORY: Onset occurred at age 73.  She describes her initial symptoms as a "gray cloud slowly cover(ing) my vision horizontally".  She would experience these episodes occasionally over the next several years.  In 1986, she had an attack described as numbness from her chest down, as well as weakness in the lower extremities.  At this time, she was evaluated by two neurologists, Dr. [name] and Dr. [name].  She was told that she did not have MS based on the MRI alone.  The following year, she began stubbing her right toe often.  This progressed to increased difficulty walking, mostly in her right leg, as well as right foot drop.  In 1988, she then saw another neurologist, Dr. [name], who told her he thought she did have MS, but did not recommend invasive testing, such as an LP. She was never started on an immunomodulatory agent.  She was prescribed Symatril for fatigue and followed up with Dr. Freida BusmanStiffel until 1991, when her care was transferred to Dr. [name].  Various blood work was ordered, which revealed an elevated ANA of 1280.  She was [name] [facility]  She underwent multiple tests, including MRI, CT and blood work, and was formally diagnosed with lupus.    She was treated for lupus with Plaquenil from 1991 to 1999.  As her walking continued to progressively get worse, she was [facility].   He suspected primary progressive MS. She did finally undergo an LP, as well as MRI.  MRI of the brain and cervical spine from 08/31/92 was unremarkable and did not show areas of signal abnormality to suggest demyelinating disease.  Results were "inconclusive" but still diagnosed her with primary progressive MS.  He offered to try Copaxone, but since it is not FDA-approved for PPMS, she would have to pay out of pocket, so she declined.  Regarding the diagnosis of lupus, she was evaluated by two other rheumatologists in [place], who doubted the diagnosis and workup reportedly did not suggest a connective tissue disease.  She eventually had been under the care of Dr. [name], an MS specialist at [facility], who has since moved his practice.  She reportedly had VEPs which were positive for optic neuropathy.  Since that time, she has had baseline weakness involving the lower extremities.  Her most recent baseline from one year ago (11/08/11) notes she is ambulatory with a walker, however records note she had been wheelchair-bound prior.  She has assisted devices at home, including a stair lift.  She wears a brace on the right ankle due to a foot drop.   For muscle spasms in the legs, she was previously on Baclofen 20mg  at bedtime, which was ineffective.  She is currently taking cyclobenzaprine 5mg  at bedtime.  She has an overactive bladder.  She used to take [name], which seemed to work but she  stopped a while ago.  She is fearful about taking a shower, particularly due to the right leg weakness.  She has been washing herself with a washcloth at the sink.  She ambulates with a walker in the house.  She denies fatigue and actually has trouble falling asleep at night, which contributes to tiredness during the day.  12/19/12 Vit D 25-hydroxy 21.  She is not currently on immunomodulatory therapy.  She is taking D3 4000 IU daily.  For muscle spasms, she takes cyclobenzaprine 5mg  at bedtime.   She has history of back  pain and degenerative disc disease, as well as osteoarthritis of the hips.  MRI of lumbar spine with and without contrast was performed on 06/21/10, which revealed mild degenerative changes of the discs and facet joints, but no central or neural foraminal stenosis.  She has been treated for pain in the past with lumbar epidural steroid injections, which were ineffective.  Celebrex and hydrocodone have been ineffective.   She has done quite well with PT in the past.  However, due to her back pain, she has not been performing her home leg exercises.  PAST MEDICAL HISTORY: Past Medical History  Diagnosis Date  . Multiple sclerosis 1988    MEDICATIONS: Current Outpatient Prescriptions on File Prior to Visit  Medication Sig Dispense Refill  . cetirizine (ZYRTEC) 10 MG tablet Take 1 tablet (10 mg total) by mouth daily. One tab daily for allergies  30 tablet  1  . fluticasone (FLONASE) 50 MCG/ACT nasal spray Place 2 sprays into the nose 2 (two) times daily.  1 g  2  . raloxifene (EVISTA) 60 MG tablet Take 60 mg by mouth daily.       No current facility-administered medications on file prior to visit.    ALLERGIES: No Known Allergies  FAMILY HISTORY: Family History  Problem Relation Age of Onset  . Ataxia Neg Hx   . Chorea Neg Hx   . Dementia Neg Hx   . Mental retardation Neg Hx   . Migraines Neg Hx   . Multiple sclerosis Neg Hx   . Neurofibromatosis Neg Hx   . Neuropathy Neg Hx   . Parkinsonism Neg Hx   . Seizures Neg Hx   . Stroke Neg Hx     SOCIAL HISTORY: History   Social History  . Marital Status: Married    Spouse Name: N/A    Number of Children: N/A  . Years of Education: N/A   Occupational History  . Not on file.   Social History Main Topics  . Smoking status: Never Smoker   . Smokeless tobacco: Never Used  . Alcohol Use: No  . Drug Use: No  . Sexual Activity: Not on file   Other Topics Concern  . Not on file   Social History Narrative  . No narrative on  file    REVIEW OF SYSTEMS: Constitutional: No fevers, chills, or sweats, no generalized fatigue, change in appetite Eyes: No visual changes, double vision, eye pain Ear, nose and throat: No hearing loss, ear pain, nasal congestion, sore throat Cardiovascular: No chest pain, palpitations Respiratory:  No shortness of breath at rest or with exertion, wheezes GastrointestinaI: No nausea, vomiting, diarrhea, abdominal pain, fecal incontinence Genitourinary:  No dysuria, urinary retention or frequency Musculoskeletal:  No neck pain, back pain Integumentary: No rash, pruritus, skin lesions Neurological: as above Psychiatric: No depression, insomnia, anxiety Endocrine: No palpitations, fatigue, diaphoresis, mood swings, change in appetite, change in weight, increased thirst  Hematologic/Lymphatic:  No anemia, purpura, petechiae. Allergic/Immunologic: no itchy/runny eyes, nasal congestion, recent allergic reactions, rashes  PHYSICAL EXAM: Filed Vitals:   07/15/13 1412  BP: 136/80  Pulse: 74   General: No acute distress Head:  Normocephalic/atraumatic Neck: supple, no paraspinal tenderness, full range of motion Heart:  Regular rate and rhythm Lungs:  Clear to auscultation bilaterally Back: No paraspinal tenderness Neurological Exam: alert and oriented to person, place, and time. Attention span and concentration intact, recent and remote memory intact, fund of knowledge intact.  Speech fluent and not dysarthric, language intact.  CN II-XII intact. Fundi not visualized.  Bulk and tone normal, muscle strength 2/5 right hip flexion, 3/5 right knee extension and flexion, right foot drop, 3/5 left hip flexion, 5-/5 left knee flexion/extension and ankle dorsiflexion. 5/5 in upper extremities.  Sensation to light touch, temperature and vibration intact.  Deep tendon reflexes 2+ throughout, bilateral Babinski.  Finger to nose intact.  In wheelchair.  Cannot stand without walker.  IMPRESSION: Secondary  progressive multiple sclerosis Overactive bladder Muscle spasms  PLAN: 1.  Increase the cyclobenzaprine to 5-10mg  up to 3 times daily. 2.  For overactive bladder, check the bottles of Toviaz.  She is to call us to let us know the dose and whether it is expired.  If it is expired, we can refill it. 3.  Does not want to pursue home health to help with bathing at this time. 4.  Continue D3 4000 IU daily.  Check to see if Vitamin D level was checked at PCP's office. 5.  Follow up in 3 months.  Shon Millet, DO  CC:  Guerry Bruin, MD

## 2013-07-15 NOTE — Telephone Encounter (Signed)
Patient called stating she took Toviaz 4 mg  The exp date is 05/02/12 if you are wanting her to continue this she would like it sent to her mail order pharmacy [facility] added to her chart

## 2013-07-15 NOTE — Patient Instructions (Addendum)
1.  Increase the cyclobenzaprine to 10mg  tablets.  Take 1/2 to 1 full tablet up to 3 times daily as needed for muscle spasms. 2.  Check the bottles of Toviaz.  Call us to let us know the dose and whether it is expired.  If it is expired, we can refill it for you. 3.  If you feel you need assistance with bathing, let us know. 4.  Follow up in 3 months.

## 2013-07-16 ENCOUNTER — Telehealth: Payer: Self-pay | Admitting: *Deleted

## 2013-07-16 ENCOUNTER — Other Ambulatory Visit: Payer: Self-pay | Admitting: *Deleted

## 2013-07-16 MED ORDER — FESOTERODINE FUMARATE ER 4 MG PO TB24: 4.0000 mg | ORAL_TABLET | Freq: Every day | ORAL | Status: DC

## 2013-07-16 NOTE — Telephone Encounter (Signed)
Patient is aware of medication [name] called to mail order pharmacy

## 2013-07-16 NOTE — Telephone Encounter (Signed)
Let's put in a prescription for Toviaz 4mg  daily, #30, R5

## 2013-07-17 ENCOUNTER — Other Ambulatory Visit: Payer: Self-pay | Admitting: *Deleted

## 2013-07-17 ENCOUNTER — Telehealth: Payer: Self-pay | Admitting: Neurology

## 2013-07-17 DIAGNOSIS — M62838 Other muscle spasm: Secondary | ICD-10-CM

## 2013-07-17 MED ORDER — TIZANIDINE HCL 2 MG PO CAPS: 2.0000 mg | ORAL_CAPSULE | Freq: Three times a day (TID) | ORAL | Status: DC

## 2013-07-17 NOTE — Telephone Encounter (Signed)
Patient called stating cyclobenzaprine to 5-10mg  up to 3 times daily.  was not working for her. She ask that I call the Toviaz 4 mg into her local pharmacy as well it is cheaper than the mail order . Please advise on the cyclobenzaprine

## 2013-07-17 NOTE — Telephone Encounter (Signed)
She can try tizanidine 2mg  three times daily as needed

## 2013-07-17 NOTE — Telephone Encounter (Signed)
Pt called requesting to speak to a nurse regarding her meds. Please call pt [phone]

## 2013-09-12 ENCOUNTER — Telehealth: Payer: Self-pay | Admitting: Neurology

## 2013-09-12 NOTE — Telephone Encounter (Signed)
Pt called requesting to speak to a nurse regarding her meds.  Please call pt c/b [phone]

## 2013-09-25 DIAGNOSIS — N8110 Cystocele, unspecified: Secondary | ICD-10-CM | POA: Diagnosis not present

## 2013-10-02 DIAGNOSIS — L600 Ingrowing nail: Secondary | ICD-10-CM | POA: Diagnosis not present

## 2013-10-14 ENCOUNTER — Ambulatory Visit (INDEPENDENT_AMBULATORY_CARE_PROVIDER_SITE_OTHER): Payer: Medicare Other | Admitting: Neurology

## 2013-10-14 ENCOUNTER — Encounter: Payer: Self-pay | Admitting: Neurology

## 2013-10-14 VITALS — BP 108/58 | HR 70 | Resp 16 | Wt 107.2 lb

## 2013-10-14 DIAGNOSIS — M62838 Other muscle spasm: Secondary | ICD-10-CM | POA: Insufficient documentation

## 2013-10-14 DIAGNOSIS — M25559 Pain in unspecified hip: Secondary | ICD-10-CM

## 2013-10-14 DIAGNOSIS — N3281 Overactive bladder: Secondary | ICD-10-CM

## 2013-10-14 DIAGNOSIS — M25552 Pain in left hip: Secondary | ICD-10-CM

## 2013-10-14 DIAGNOSIS — M545 Low back pain, unspecified: Secondary | ICD-10-CM | POA: Diagnosis not present

## 2013-10-14 DIAGNOSIS — M25551 Pain in right hip: Secondary | ICD-10-CM

## 2013-10-14 DIAGNOSIS — G35 Multiple sclerosis: Secondary | ICD-10-CM | POA: Diagnosis not present

## 2013-10-14 DIAGNOSIS — N318 Other neuromuscular dysfunction of bladder: Secondary | ICD-10-CM

## 2013-10-14 DIAGNOSIS — M1610 Unilateral primary osteoarthritis, unspecified hip: Secondary | ICD-10-CM

## 2013-10-14 DIAGNOSIS — M1611 Unilateral primary osteoarthritis, right hip: Secondary | ICD-10-CM

## 2013-10-14 DIAGNOSIS — M1612 Unilateral primary osteoarthritis, left hip: Secondary | ICD-10-CM

## 2013-10-14 MED ORDER — GABAPENTIN 300 MG PO CAPS: 300.0000 mg | ORAL_CAPSULE | Freq: Every day | ORAL | Status: DC

## 2013-10-14 MED ORDER — OMEPRAZOLE 20 MG PO CPDR: 20.0000 mg | DELAYED_RELEASE_CAPSULE | Freq: Every day | ORAL | Status: DC

## 2013-10-14 NOTE — Progress Notes (Signed)
NEUROLOGY FOLLOW UP OFFICE NOTE  Suzanne Kim [id number]  HISTORY OF PRESENT ILLNESS: Suzanne Kim is a 73 year old right-handed woman with history of osteoporosis and multiple sclerosis, who follows up for secondary progressive multiple sclerosis.    UPDATE: She has been dealing with significant pain.  First, she has cramping in the right foot that extends up the leg.  She also has intense low back pain, which is worse after period of inactivity, such as when she awakens from sleep, and improves when she has been moving around.  She takes tizanidine at bedtime which does not help.  She can't take it during the day because it makes her too groggy, which can affect her balance and make her more prone to falls.  She no longer   HISTORY: Onset occurred at age 49.  She describes her initial symptoms as a "gray cloud slowly cover(ing) my vision horizontally".  She would experience these episodes occasionally over the next several years.  In 1986, she had an attack described as numbness from her chest down, as well as weakness in the lower extremities.  At this time, she was evaluated by two neurologists, Dr. Ninetta Lights and Dr. Sandria Manly.  She was told that she did not have MS based on the MRI alone.  The following year, she began stubbing her right toe often.  This progressed to increased difficulty walking, mostly in her right leg, as well as right foot drop.  In 1988, she then saw another neurologist, Dr. Dorina Hoyer, who told her he thought she did have MS, but did not recommend invasive testing, such as an LP. She was never started on an immunomodulatory agent.  She was prescribed Symatril for fatigue and followed up with Dr. Freida Busman until 1991, when her care was transferred to Dr. Anne Hahn.  Various blood work was ordered, which revealed an elevated ANA of 1280.  [facility]  She underwent multiple tests, including MRI, CT and blood work, and was  formally diagnosed with lupus.    She was treated for lupus with Plaquenil from 1991 to 1999.  As her walking continued to progressively get worse, she was Dr. Elwyn Lade at the [facility].  He suspected primary progressive MS. She did finally undergo an LP, as well as MRI.  MRI of the brain and cervical spine from 08/31/92 was unremarkable and did not show areas of signal abnormality to suggest demyelinating disease.  Results were "inconclusive" but still diagnosed her with primary progressive MS.  He offered to try Copaxone, but since it is not FDA-approved for PPMS, she would have to pay out of pocket, so she declined.  Regarding the diagnosis of lupus, she was evaluated by two other rheumatologists in [place], who doubted the diagnosis and workup reportedly did not suggest a connective tissue disease.  She eventually had been under the care of Dr. Leotis Shames.  She reportedly had VEPs which were positive for optic neuropathy.  She has had baseline weakness involving the lower extremities.  She has assisted devices at home, including a stair lift.  She wears a brace on the right ankle due to a foot drop.   She is fearful about taking a shower, particularly due to the right leg weakness.  She has been washing herself with a washcloth at the sink.  She ambulates with a walker in the house.  She denies fatigue and actually has trouble falling asleep at night, which  contributes to tiredness during the day.  She has overactive bladder.  She has history of back pain and degenerative disc disease, as well as osteoarthritis of the hips.  MRI of lumbar spine with and without contrast was performed on 06/21/10, which revealed mild degenerative changes of the discs and facet joints, but no central or neural foraminal stenosis.  She has been treated for pain in the past with lumbar epidural steroid injections, which were ineffective.  Celebrex and hydrocodone have been ineffective.  12/19/12 Vit D 25-hydroxy 21.  She  is not currently on immunomodulatory therapy.  She is taking D3 4000 IU daily.  For muscle spasms, she takes tizanidine , and Toviaz for overactive bladder.  Prior medications include:  Baclofen (for muscle spasms, ineffective), cyclobenzaprine , Symatril (for fatigue),   PAST MEDICAL HISTORY: Past Medical History  Diagnosis Date  . Multiple sclerosis 1988    MEDICATIONS: Current Outpatient Prescriptions on File Prior to Visit  Medication Sig Dispense Refill  . cetirizine (ZYRTEC) 10 MG tablet Take 1 tablet (10 mg total) by mouth daily. One tab daily for allergies  30 tablet  1  . raloxifene (EVISTA) 60 MG tablet Take 60 mg by mouth daily.      . tizanidine (ZANAFLEX) 2 MG capsule Take 1 capsule (2 mg total) by mouth 3 (three) times daily.  90 capsule  1  . cyclobenzaprine (FLEXERIL) 10 MG tablet Take 0.5-1tab TID prn for muscle spasms.  90 tablet  0  . fesoterodine (TOVIAZ) 4 MG TB24 tablet Take 1 tablet (4 mg total) by mouth daily.  90 tablet  3  . fluticasone (FLONASE) 50 MCG/ACT nasal spray Place 2 sprays into the nose 2 (two) times daily.  1 g  2   No current facility-administered medications on file prior to visit.    ALLERGIES: No Known Allergies  FAMILY HISTORY: Family History  Problem Relation Age of Onset  . Ataxia Neg Hx   . Chorea Neg Hx   . Dementia Neg Hx   . Mental retardation Neg Hx   . Migraines Neg Hx   . Multiple sclerosis Neg Hx   . Neurofibromatosis Neg Hx   . Neuropathy Neg Hx   . Parkinsonism Neg Hx   . Seizures Neg Hx   . Stroke Neg Hx     SOCIAL HISTORY: History   Social History  . Marital Status: Married    Spouse Name: N/A    Number of Children: N/A  . Years of Education: N/A   Occupational History  . Not on file.   Social History Main Topics  . Smoking status: Never Smoker   . Smokeless tobacco: Never Used  . Alcohol Use: No  . Drug Use: No  . Sexual Activity: Not on file   Other Topics Concern  . Not on file   Social  History Narrative  . No narrative on file    REVIEW OF SYSTEMS: Constitutional: No fevers, chills, or sweats, no generalized fatigue, change in appetite Eyes: No visual changes, double vision, eye pain Ear, nose and throat: No hearing loss, ear pain, nasal congestion, sore throat Cardiovascular: No chest pain, palpitations Respiratory:  No shortness of breath at rest or with exertion, wheezes GastrointestinaI: No nausea, vomiting, diarrhea, abdominal pain, fecal incontinence Genitourinary:  No dysuria, urinary retention or frequency Musculoskeletal:  Back pain Integumentary: No rash, pruritus, skin lesions Neurological: as above Psychiatric: Insomnia Endocrine: No palpitations, fatigue, diaphoresis, mood swings, change in appetite, change in weight, increased  thirst Hematologic/Lymphatic:  No anemia, purpura, petechiae. Allergic/Immunologic: no itchy/runny eyes, nasal congestion, recent allergic reactions, rashes  PHYSICAL EXAM: Filed Vitals:   10/14/13 1416  BP: 108/58  Pulse: 70  Resp: 16   General: No acute distress Head:  Normocephalic/atraumatic Neck: supple, no paraspinal tenderness, full range of motion Heart:  Regular rate and rhythm Lungs:  Clear to auscultation bilaterally Back: No paraspinal tenderness Neurological Exam: alert and oriented to person, place, and time. Attention span and concentration intact, recent and remote memory intact, fund of knowledge intact.  Speech fluent and not dysarthric, language intact.  CN II-XII intact. Fundi not visualized.  Bulk and tone normal, muscle strength 2/5 right hip flexion, 3/5 right knee extension and flexion, right foot drop, 3/5 left hip flexion, 5-/5 left knee flexion/extension and ankle dorsiflexion. 5/5 in upper extremities.  Sensation to light touch, temperature and vibration intact.  Deep tendon reflexes 2+ throughout, bilateral Babinski.  Finger to nose intact.  In wheelchair.    IMPRESSION: Secondary progressive  multiple sclerosis Overactive bladder Muscle spasms Osteoarthritis Low back bain due to OA Bilateral hip pain due to OA  PLAN: 1.  Discontinue tizanidine.  Will start gabapentin  at bedtime to help with leg cramps and sleep. 2.  Would likely use a NSAID for back pain, since it sounds arthritic.  Will prescribe omeprazole for stomach protection.  Advised to ask PCP about appropriate NSAID to use.  She has used Celebrex in the past. 3.  Toviaz for overactive bladder 4.  D3 4000 IU daily 5.  Follow up in 3 months.  Shon Millet, DO  CC: Guerry Bruin, MD

## 2013-10-14 NOTE — Patient Instructions (Signed)
1.  Will start gabapentin  at bedtime to address the leg cramp. It may help with sleep too. 2.  Start omeprazole  daily (to protect the stomach since you take aspirin for the back and hip pain 3.  Discuss with Dr. Wylene Simmer regarding an appropriate nonsteroidal antiinflammatory medication for arthritis. 4.  Follow up in 3 months.

## 2013-10-30 ENCOUNTER — Telehealth: Payer: Self-pay | Admitting: Neurology

## 2013-10-30 NOTE — Telephone Encounter (Signed)
Pt wants to talk to someone about medication please call [phone]

## 2013-10-30 NOTE — Telephone Encounter (Signed)
Patient is asking if she can take Gabapentin 150mg  or 100mg  ? She feels the 300mg  is to strong and makes her off balance . Please advise

## 2013-10-31 NOTE — Telephone Encounter (Signed)
Left message for patient to return call to office. x2

## 2013-10-31 NOTE — Telephone Encounter (Signed)
She can go down to 100mg .  We can slowly go up on the dose from there, if needed.

## 2013-11-01 ENCOUNTER — Other Ambulatory Visit: Payer: Self-pay | Admitting: *Deleted

## 2013-11-01 ENCOUNTER — Telehealth: Payer: Self-pay | Admitting: *Deleted

## 2013-11-01 NOTE — Telephone Encounter (Signed)
Pt returned you call please call her back at [phone]

## 2013-11-01 NOTE — Telephone Encounter (Signed)
Neurontin 100mg  [facility]  #90 with 3 refills  called to [facility]  Patient is aware

## 2013-11-08 ENCOUNTER — Telehealth: Payer: Self-pay | Admitting: Neurology

## 2013-11-08 NOTE — Telephone Encounter (Signed)
Pt called requesting to speak to a nurse regarding her Gabapentin. Pt's muscle spasms are getting worse.  Please call pt # [phone]

## 2013-11-08 NOTE — Telephone Encounter (Signed)
Patient called stating that the 100 gabapentin in not really working  She is taking 100mg   And has been for only 48 hours she ask if she could go up and was advise to increase to 200 mg starting on Sunday and to give it a wwek if not working advised to call office

## 2013-11-13 NOTE — Telephone Encounter (Signed)
Pt called again today, she says she has taken the 200mg  dose x 4 days (see previous message). She reports still having muscle spasms. Please call back at confirmed number [phone] / Sherri S.

## 2013-11-15 ENCOUNTER — Telehealth: Payer: Self-pay | Admitting: Neurology

## 2013-11-15 NOTE — Telephone Encounter (Signed)
Pt called/ returning your call regarding her Gabapentin. Please return her call #  [phone]

## 2013-11-18 ENCOUNTER — Telehealth: Payer: Self-pay | Admitting: Neurology

## 2013-11-18 NOTE — Telephone Encounter (Signed)
Spoke with patient she will increase the Neurontin to 300 mg and call in 7 days

## 2013-11-18 NOTE — Telephone Encounter (Signed)
Pt needs to talk to someone about medication [phone]

## 2013-11-18 NOTE — Telephone Encounter (Signed)
Yes, I would go up to 300mg  and see how she does over the next week.

## 2013-11-18 NOTE — Telephone Encounter (Signed)
Patient has tried 100 mg of neurontin for 2 weeks it did not work  She wanted to try the 200 mg  1 x a day it was working until Friday and she has been having pain since  Late Friday evening she is wondering if she should go up to the 300mg  ? This is what you prescribed for her to begin with. Please  Advise

## 2013-11-25 ENCOUNTER — Telehealth: Payer: Self-pay | Admitting: *Deleted

## 2013-11-25 MED ORDER — CLONAZEPAM 0.5 MG PO TABS
0.5000 mg | ORAL_TABLET | Freq: Two times a day (BID) | ORAL | Status: DC | PRN
Start: 2013-11-25 — End: 2015-04-13

## 2013-11-25 NOTE — Telephone Encounter (Signed)
Clonazepam 0.5mg  at bedtime as needed would be okay (30 tablets)

## 2013-11-25 NOTE — Telephone Encounter (Signed)
Patient called stating her neurontin 300 mg is just to strong she has cut it back to 200 mg  and is asking is there anything eles she can take . She ask about adding klonopin if this might help ?

## 2013-11-26 NOTE — Telephone Encounter (Signed)
Error

## 2013-12-03 ENCOUNTER — Other Ambulatory Visit: Payer: Self-pay | Admitting: *Deleted

## 2013-12-03 ENCOUNTER — Telehealth: Payer: Self-pay | Admitting: Neurology

## 2013-12-03 DIAGNOSIS — G35 Multiple sclerosis: Secondary | ICD-10-CM

## 2013-12-03 NOTE — Telephone Encounter (Signed)
Patient states she is still having cramps in feet and  the Klonopin is not helping please advise

## 2013-12-03 NOTE — Telephone Encounter (Signed)
She has had side effects to multiple medications for muscle cramps.  At this point, I recommend PT.  Increasing movement in her legs would help relieve the cramping.

## 2013-12-03 NOTE — Telephone Encounter (Signed)
Pt states that she is still having cramps in her feet and she has taken the klonopin and it has not help

## 2013-12-07 DIAGNOSIS — Z7982 Long term (current) use of aspirin: Secondary | ICD-10-CM | POA: Diagnosis not present

## 2013-12-07 DIAGNOSIS — M1611 Unilateral primary osteoarthritis, right hip: Secondary | ICD-10-CM | POA: Diagnosis not present

## 2013-12-07 DIAGNOSIS — M25552 Pain in left hip: Secondary | ICD-10-CM | POA: Diagnosis not present

## 2013-12-07 DIAGNOSIS — M62838 Other muscle spasm: Secondary | ICD-10-CM | POA: Diagnosis not present

## 2013-12-07 DIAGNOSIS — M1612 Unilateral primary osteoarthritis, left hip: Secondary | ICD-10-CM | POA: Diagnosis not present

## 2013-12-07 DIAGNOSIS — G35 Multiple sclerosis: Secondary | ICD-10-CM | POA: Diagnosis not present

## 2013-12-07 DIAGNOSIS — M25551 Pain in right hip: Secondary | ICD-10-CM | POA: Diagnosis not present

## 2013-12-07 DIAGNOSIS — Z9181 History of falling: Secondary | ICD-10-CM | POA: Diagnosis not present

## 2013-12-07 DIAGNOSIS — M545 Low back pain: Secondary | ICD-10-CM | POA: Diagnosis not present

## 2013-12-09 DIAGNOSIS — M25551 Pain in right hip: Secondary | ICD-10-CM | POA: Diagnosis not present

## 2013-12-09 DIAGNOSIS — G35 Multiple sclerosis: Secondary | ICD-10-CM | POA: Diagnosis not present

## 2013-12-09 DIAGNOSIS — M62838 Other muscle spasm: Secondary | ICD-10-CM | POA: Diagnosis not present

## 2013-12-09 DIAGNOSIS — M25552 Pain in left hip: Secondary | ICD-10-CM | POA: Diagnosis not present

## 2013-12-09 DIAGNOSIS — M1612 Unilateral primary osteoarthritis, left hip: Secondary | ICD-10-CM | POA: Diagnosis not present

## 2013-12-09 DIAGNOSIS — M545 Low back pain: Secondary | ICD-10-CM | POA: Diagnosis not present

## 2013-12-11 DIAGNOSIS — M25551 Pain in right hip: Secondary | ICD-10-CM | POA: Diagnosis not present

## 2013-12-11 DIAGNOSIS — M1612 Unilateral primary osteoarthritis, left hip: Secondary | ICD-10-CM | POA: Diagnosis not present

## 2013-12-11 DIAGNOSIS — M25552 Pain in left hip: Secondary | ICD-10-CM | POA: Diagnosis not present

## 2013-12-11 DIAGNOSIS — M545 Low back pain: Secondary | ICD-10-CM | POA: Diagnosis not present

## 2013-12-11 DIAGNOSIS — M62838 Other muscle spasm: Secondary | ICD-10-CM | POA: Diagnosis not present

## 2013-12-11 DIAGNOSIS — G35 Multiple sclerosis: Secondary | ICD-10-CM | POA: Diagnosis not present

## 2013-12-16 DIAGNOSIS — M545 Low back pain: Secondary | ICD-10-CM | POA: Diagnosis not present

## 2013-12-16 DIAGNOSIS — M1612 Unilateral primary osteoarthritis, left hip: Secondary | ICD-10-CM | POA: Diagnosis not present

## 2013-12-16 DIAGNOSIS — G35 Multiple sclerosis: Secondary | ICD-10-CM | POA: Diagnosis not present

## 2013-12-16 DIAGNOSIS — M25551 Pain in right hip: Secondary | ICD-10-CM | POA: Diagnosis not present

## 2013-12-16 DIAGNOSIS — M62838 Other muscle spasm: Secondary | ICD-10-CM | POA: Diagnosis not present

## 2013-12-16 DIAGNOSIS — M25552 Pain in left hip: Secondary | ICD-10-CM | POA: Diagnosis not present

## 2013-12-17 ENCOUNTER — Telehealth: Payer: Self-pay | Admitting: Neurology

## 2013-12-17 NOTE — Telephone Encounter (Signed)
Pt called requesting a refill for Gabapentin 200mg   Ph[facility]: [facility] [phone]

## 2013-12-18 ENCOUNTER — Other Ambulatory Visit: Payer: Self-pay | Admitting: *Deleted

## 2013-12-18 DIAGNOSIS — G35 Multiple sclerosis: Secondary | ICD-10-CM | POA: Diagnosis not present

## 2013-12-18 DIAGNOSIS — M1612 Unilateral primary osteoarthritis, left hip: Secondary | ICD-10-CM | POA: Diagnosis not present

## 2013-12-18 DIAGNOSIS — M62838 Other muscle spasm: Secondary | ICD-10-CM | POA: Diagnosis not present

## 2013-12-18 DIAGNOSIS — M25551 Pain in right hip: Secondary | ICD-10-CM | POA: Diagnosis not present

## 2013-12-18 DIAGNOSIS — M25552 Pain in left hip: Secondary | ICD-10-CM | POA: Diagnosis not present

## 2013-12-18 DIAGNOSIS — M545 Low back pain: Secondary | ICD-10-CM | POA: Diagnosis not present

## 2013-12-18 MED ORDER — GABAPENTIN 100 MG PO CAPS: 100.0000 mg | ORAL_CAPSULE | Freq: Two times a day (BID) | ORAL | Status: DC

## 2013-12-23 DIAGNOSIS — G35 Multiple sclerosis: Secondary | ICD-10-CM | POA: Diagnosis not present

## 2013-12-23 DIAGNOSIS — M62838 Other muscle spasm: Secondary | ICD-10-CM | POA: Diagnosis not present

## 2013-12-23 DIAGNOSIS — M1612 Unilateral primary osteoarthritis, left hip: Secondary | ICD-10-CM | POA: Diagnosis not present

## 2013-12-23 DIAGNOSIS — M25552 Pain in left hip: Secondary | ICD-10-CM | POA: Diagnosis not present

## 2013-12-23 DIAGNOSIS — M25551 Pain in right hip: Secondary | ICD-10-CM | POA: Diagnosis not present

## 2013-12-23 DIAGNOSIS — M545 Low back pain: Secondary | ICD-10-CM | POA: Diagnosis not present

## 2013-12-25 DIAGNOSIS — N812 Incomplete uterovaginal prolapse: Secondary | ICD-10-CM | POA: Diagnosis not present

## 2013-12-26 DIAGNOSIS — M545 Low back pain: Secondary | ICD-10-CM | POA: Diagnosis not present

## 2013-12-26 DIAGNOSIS — M25551 Pain in right hip: Secondary | ICD-10-CM | POA: Diagnosis not present

## 2013-12-26 DIAGNOSIS — M62838 Other muscle spasm: Secondary | ICD-10-CM | POA: Diagnosis not present

## 2013-12-26 DIAGNOSIS — M1612 Unilateral primary osteoarthritis, left hip: Secondary | ICD-10-CM | POA: Diagnosis not present

## 2013-12-26 DIAGNOSIS — M25552 Pain in left hip: Secondary | ICD-10-CM | POA: Diagnosis not present

## 2013-12-26 DIAGNOSIS — G35 Multiple sclerosis: Secondary | ICD-10-CM | POA: Diagnosis not present

## 2013-12-30 DIAGNOSIS — M25551 Pain in right hip: Secondary | ICD-10-CM | POA: Diagnosis not present

## 2013-12-30 DIAGNOSIS — M62838 Other muscle spasm: Secondary | ICD-10-CM | POA: Diagnosis not present

## 2013-12-30 DIAGNOSIS — M545 Low back pain: Secondary | ICD-10-CM | POA: Diagnosis not present

## 2013-12-30 DIAGNOSIS — G35 Multiple sclerosis: Secondary | ICD-10-CM | POA: Diagnosis not present

## 2013-12-30 DIAGNOSIS — M25552 Pain in left hip: Secondary | ICD-10-CM | POA: Diagnosis not present

## 2013-12-30 DIAGNOSIS — M1612 Unilateral primary osteoarthritis, left hip: Secondary | ICD-10-CM | POA: Diagnosis not present

## 2014-01-02 DIAGNOSIS — M25552 Pain in left hip: Secondary | ICD-10-CM | POA: Diagnosis not present

## 2014-01-02 DIAGNOSIS — G35 Multiple sclerosis: Secondary | ICD-10-CM | POA: Diagnosis not present

## 2014-01-02 DIAGNOSIS — M1612 Unilateral primary osteoarthritis, left hip: Secondary | ICD-10-CM | POA: Diagnosis not present

## 2014-01-02 DIAGNOSIS — M62838 Other muscle spasm: Secondary | ICD-10-CM | POA: Diagnosis not present

## 2014-01-02 DIAGNOSIS — M25551 Pain in right hip: Secondary | ICD-10-CM | POA: Diagnosis not present

## 2014-01-02 DIAGNOSIS — M545 Low back pain: Secondary | ICD-10-CM | POA: Diagnosis not present

## 2014-01-06 DIAGNOSIS — M1612 Unilateral primary osteoarthritis, left hip: Secondary | ICD-10-CM | POA: Diagnosis not present

## 2014-01-06 DIAGNOSIS — M62838 Other muscle spasm: Secondary | ICD-10-CM | POA: Diagnosis not present

## 2014-01-06 DIAGNOSIS — M545 Low back pain: Secondary | ICD-10-CM | POA: Diagnosis not present

## 2014-01-06 DIAGNOSIS — M25552 Pain in left hip: Secondary | ICD-10-CM | POA: Diagnosis not present

## 2014-01-06 DIAGNOSIS — G35 Multiple sclerosis: Secondary | ICD-10-CM | POA: Diagnosis not present

## 2014-01-06 DIAGNOSIS — M25551 Pain in right hip: Secondary | ICD-10-CM | POA: Diagnosis not present

## 2014-01-08 DIAGNOSIS — M1612 Unilateral primary osteoarthritis, left hip: Secondary | ICD-10-CM | POA: Diagnosis not present

## 2014-01-08 DIAGNOSIS — M545 Low back pain: Secondary | ICD-10-CM | POA: Diagnosis not present

## 2014-01-08 DIAGNOSIS — M25551 Pain in right hip: Secondary | ICD-10-CM | POA: Diagnosis not present

## 2014-01-08 DIAGNOSIS — M62838 Other muscle spasm: Secondary | ICD-10-CM | POA: Diagnosis not present

## 2014-01-08 DIAGNOSIS — M25552 Pain in left hip: Secondary | ICD-10-CM | POA: Diagnosis not present

## 2014-01-08 DIAGNOSIS — G35 Multiple sclerosis: Secondary | ICD-10-CM | POA: Diagnosis not present

## 2014-01-13 ENCOUNTER — Other Ambulatory Visit: Payer: Self-pay | Admitting: *Deleted

## 2014-01-13 ENCOUNTER — Telehealth: Payer: Self-pay | Admitting: *Deleted

## 2014-01-13 MED ORDER — GABAPENTIN 100 MG PO CAPS: 100.0000 mg | ORAL_CAPSULE | Freq: Two times a day (BID) | ORAL | Status: DC

## 2014-01-13 NOTE — Telephone Encounter (Signed)
Refill for Gabapentin faxed to pharmacy with 1 refill

## 2014-01-13 NOTE — Telephone Encounter (Signed)
Patient requesting a refill on her gabapentin.

## 2014-01-14 ENCOUNTER — Ambulatory Visit: Payer: Medicare Other | Admitting: Neurology

## 2014-01-15 ENCOUNTER — Ambulatory Visit: Payer: Medicare Other | Admitting: Neurology

## 2014-01-15 DIAGNOSIS — M25551 Pain in right hip: Secondary | ICD-10-CM | POA: Diagnosis not present

## 2014-01-15 DIAGNOSIS — M545 Low back pain: Secondary | ICD-10-CM | POA: Diagnosis not present

## 2014-01-15 DIAGNOSIS — M25552 Pain in left hip: Secondary | ICD-10-CM | POA: Diagnosis not present

## 2014-01-15 DIAGNOSIS — M1612 Unilateral primary osteoarthritis, left hip: Secondary | ICD-10-CM | POA: Diagnosis not present

## 2014-01-15 DIAGNOSIS — G35 Multiple sclerosis: Secondary | ICD-10-CM | POA: Diagnosis not present

## 2014-01-15 DIAGNOSIS — M62838 Other muscle spasm: Secondary | ICD-10-CM | POA: Diagnosis not present

## 2014-01-17 DIAGNOSIS — M25552 Pain in left hip: Secondary | ICD-10-CM | POA: Diagnosis not present

## 2014-01-17 DIAGNOSIS — M1612 Unilateral primary osteoarthritis, left hip: Secondary | ICD-10-CM | POA: Diagnosis not present

## 2014-01-17 DIAGNOSIS — M25551 Pain in right hip: Secondary | ICD-10-CM | POA: Diagnosis not present

## 2014-01-17 DIAGNOSIS — M62838 Other muscle spasm: Secondary | ICD-10-CM | POA: Diagnosis not present

## 2014-01-17 DIAGNOSIS — M545 Low back pain: Secondary | ICD-10-CM | POA: Diagnosis not present

## 2014-01-17 DIAGNOSIS — G35 Multiple sclerosis: Secondary | ICD-10-CM | POA: Diagnosis not present

## 2014-01-20 DIAGNOSIS — M1612 Unilateral primary osteoarthritis, left hip: Secondary | ICD-10-CM | POA: Diagnosis not present

## 2014-01-20 DIAGNOSIS — M25551 Pain in right hip: Secondary | ICD-10-CM | POA: Diagnosis not present

## 2014-01-20 DIAGNOSIS — M62838 Other muscle spasm: Secondary | ICD-10-CM | POA: Diagnosis not present

## 2014-01-20 DIAGNOSIS — G35 Multiple sclerosis: Secondary | ICD-10-CM | POA: Diagnosis not present

## 2014-01-20 DIAGNOSIS — M25552 Pain in left hip: Secondary | ICD-10-CM | POA: Diagnosis not present

## 2014-01-20 DIAGNOSIS — M545 Low back pain: Secondary | ICD-10-CM | POA: Diagnosis not present

## 2014-01-23 DIAGNOSIS — M1612 Unilateral primary osteoarthritis, left hip: Secondary | ICD-10-CM | POA: Diagnosis not present

## 2014-01-23 DIAGNOSIS — G35 Multiple sclerosis: Secondary | ICD-10-CM | POA: Diagnosis not present

## 2014-01-23 DIAGNOSIS — M25551 Pain in right hip: Secondary | ICD-10-CM | POA: Diagnosis not present

## 2014-01-23 DIAGNOSIS — M545 Low back pain: Secondary | ICD-10-CM | POA: Diagnosis not present

## 2014-01-23 DIAGNOSIS — M62838 Other muscle spasm: Secondary | ICD-10-CM | POA: Diagnosis not present

## 2014-01-23 DIAGNOSIS — M25552 Pain in left hip: Secondary | ICD-10-CM | POA: Diagnosis not present

## 2014-01-27 DIAGNOSIS — M1612 Unilateral primary osteoarthritis, left hip: Secondary | ICD-10-CM | POA: Diagnosis not present

## 2014-01-27 DIAGNOSIS — M62838 Other muscle spasm: Secondary | ICD-10-CM | POA: Diagnosis not present

## 2014-01-27 DIAGNOSIS — M25552 Pain in left hip: Secondary | ICD-10-CM | POA: Diagnosis not present

## 2014-01-27 DIAGNOSIS — G35 Multiple sclerosis: Secondary | ICD-10-CM | POA: Diagnosis not present

## 2014-01-27 DIAGNOSIS — M25551 Pain in right hip: Secondary | ICD-10-CM | POA: Diagnosis not present

## 2014-01-27 DIAGNOSIS — M545 Low back pain: Secondary | ICD-10-CM | POA: Diagnosis not present

## 2014-01-30 DIAGNOSIS — M1612 Unilateral primary osteoarthritis, left hip: Secondary | ICD-10-CM | POA: Diagnosis not present

## 2014-01-30 DIAGNOSIS — G35 Multiple sclerosis: Secondary | ICD-10-CM | POA: Diagnosis not present

## 2014-01-30 DIAGNOSIS — M25552 Pain in left hip: Secondary | ICD-10-CM | POA: Diagnosis not present

## 2014-01-30 DIAGNOSIS — M545 Low back pain: Secondary | ICD-10-CM | POA: Diagnosis not present

## 2014-01-30 DIAGNOSIS — M25551 Pain in right hip: Secondary | ICD-10-CM | POA: Diagnosis not present

## 2014-01-30 DIAGNOSIS — M62838 Other muscle spasm: Secondary | ICD-10-CM | POA: Diagnosis not present

## 2014-02-11 ENCOUNTER — Other Ambulatory Visit: Payer: Self-pay | Admitting: *Deleted

## 2014-02-11 ENCOUNTER — Telehealth: Payer: Self-pay | Admitting: *Deleted

## 2014-02-11 ENCOUNTER — Telehealth: Payer: Self-pay | Admitting: Neurology

## 2014-02-11 DIAGNOSIS — G35 Multiple sclerosis: Secondary | ICD-10-CM

## 2014-02-11 NOTE — Telephone Encounter (Signed)
Patient is aware that home health PT has been ordered again from [facility]

## 2014-02-11 NOTE — Telephone Encounter (Signed)
Pt called wanting to speak to a nurse regarding her physical therapy. Please call pt# [phone]

## 2014-02-11 NOTE — Telephone Encounter (Signed)
Ok to order 

## 2014-02-11 NOTE — Telephone Encounter (Signed)
Patient is asking if you can order PT for her for another 4 weeks or so? She says it really helped . It has been 2 weeks since her last treatment and she is already having the leg cramping again . Please advise

## 2014-02-12 ENCOUNTER — Telehealth: Payer: Self-pay | Admitting: *Deleted

## 2014-02-12 NOTE — Telephone Encounter (Signed)
Left message for patient to return call to office. 

## 2014-02-13 ENCOUNTER — Encounter: Payer: Self-pay | Admitting: Neurology

## 2014-02-13 ENCOUNTER — Ambulatory Visit (INDEPENDENT_AMBULATORY_CARE_PROVIDER_SITE_OTHER): Payer: Medicare Other | Admitting: Neurology

## 2014-02-13 VITALS — BP 138/62 | HR 68 | Resp 16 | Ht 63.0 in | Wt 116.1 lb

## 2014-02-13 DIAGNOSIS — N3281 Overactive bladder: Secondary | ICD-10-CM | POA: Diagnosis not present

## 2014-02-13 DIAGNOSIS — M545 Low back pain, unspecified: Secondary | ICD-10-CM

## 2014-02-13 DIAGNOSIS — G35 Multiple sclerosis: Secondary | ICD-10-CM | POA: Diagnosis not present

## 2014-02-13 DIAGNOSIS — M62838 Other muscle spasm: Secondary | ICD-10-CM | POA: Diagnosis not present

## 2014-02-13 MED ORDER — GABAPENTIN 100 MG PO CAPS: ORAL_CAPSULE | ORAL | Status: DC

## 2014-02-13 NOTE — Progress Notes (Signed)
NEUROLOGY FOLLOW UP OFFICE NOTE  Suzanne Kim [id number]  HISTORY OF PRESENT ILLNESS: Suzanne Kim is a 74 year old right-handed woman with history of osteoporosis and multiple sclerosis, who follows up for secondary progressive multiple sclerosis.    UPDATE: She was prescribed physical therapy for the right leg to help with stretching and treat the muscle spasms.  She has found it to be very beneficial.  She is also taking gabapentin  at bedtime, which helps.   was too much for her.  However, she also gets muscle spasms of the right foot during the day, in which she needs to walk in off.   HISTORY: Onset occurred at age 25.  She describes her initial symptoms as a "gray cloud slowly cover(ing) my vision horizontally".  She would experience these episodes occasionally over the next several years.  In 1986, she had an attack described as numbness from her chest down, as well as weakness in the lower extremities.  At this time, she was evaluated by two neurologists, Dr. Ninetta Lights and Dr. Sandria Manly.  She was told that she did not have MS based on the MRI alone.  The following year, she began stubbing her right toe often.  This progressed to increased difficulty walking, mostly in her right leg, as well as right foot drop.  In 1988, she then saw another neurologist, Dr. Dorina Hoyer, who told her he thought she did have MS, but did not recommend invasive testing, such as an LP. She was never started on an immunomodulatory agent.  She was prescribed Symatril for fatigue and followed up with Dr. Freida Busman until 1991, when her care was transferred to Dr. Anne Hahn.  Various blood work was ordered, which revealed an elevated ANA of 1280.  She was [facility]  She underwent multiple tests, including MRI, CT and blood work, and was formally diagnosed with lupus.    She was treated for lupus with Plaquenil from 1991 to 1999.  As her walking continued to  progressively get worse, she was Dr. Elwyn Lade at the [facility].  He suspected primary progressive MS. She did finally undergo an LP, as well as MRI.  MRI of the brain and cervical spine from 08/31/92 was unremarkable and did not show areas of signal abnormality to suggest demyelinating disease.  Results were "inconclusive" but still diagnosed her with primary progressive MS.  He offered to try Copaxone, but since it is not FDA-approved for PPMS, she would have to pay out of pocket, so she declined.  Regarding the diagnosis of lupus, she was evaluated by two other rheumatologists in [place], who doubted the diagnosis and workup reportedly did not suggest a connective tissue disease.  She eventually had been under the care of Dr. Leotis Shames.  She reportedly had VEPs which were positive for optic neuropathy.  She has had baseline weakness involving the lower extremities.  She has assisted devices at home, including a stair lift.  She wears a brace on the right ankle due to a foot drop.   She is fearful about taking a shower, particularly due to the right leg weakness.  She has been washing herself with a washcloth at the sink.  She ambulates with a walker in the house.  She denies fatigue and actually has trouble falling asleep at night, which contributes to tiredness during the day.  She has overactive bladder.  She has history of back pain and degenerative disc disease, as well  as osteoarthritis of the hips.  MRI of lumbar spine with and without contrast was performed on 06/21/10, which revealed mild degenerative changes of the discs and facet joints, but no central or neural foraminal stenosis.  She has been treated for pain in the past with lumbar epidural steroid injections, which were ineffective.  Celebrex and hydrocodone have been ineffective.  She is not currently on immunomodulatory therapy.  She is taking D3 4000 IU daily, but not daily.  For muscle spasms, she takes gabapentin  at bedtime, and  Toviaz for overactive bladder.  Prior medications include:  Baclofen (for muscle spasms, ineffective), cyclobenzaprine , tizanidine, Symatril (for fatigue)  PAST MEDICAL HISTORY: Past Medical History  Diagnosis Date  . Multiple sclerosis 1988    MEDICATIONS: Current Outpatient Prescriptions on File Prior to Visit  Medication Sig Dispense Refill  . fesoterodine (TOVIAZ) 4 MG TB24 tablet Take 1 tablet (4 mg total) by mouth daily. 90 tablet 3  . omeprazole (PRILOSEC) 20 MG capsule Take 1 capsule (20 mg total) by mouth daily. 30 capsule 3  . raloxifene (EVISTA) 60 MG tablet Take 60 mg by mouth daily.    . cetirizine (ZYRTEC) 10 MG tablet Take 1 tablet (10 mg total) by mouth daily. One tab daily for allergies (Patient not taking: Reported on 02/13/2014) 30 tablet 1  . clonazePAM (KLONOPIN) 0.5 MG tablet Take 1 tablet (0.5 mg total) by mouth 2 (two) times daily as needed for anxiety. (Patient not taking: Reported on 02/13/2014) 30 tablet 2  . cyclobenzaprine (FLEXERIL) 10 MG tablet Take 0.5-1tab TID prn for muscle spasms. (Patient not taking: Reported on 02/13/2014) 90 tablet 0  . fluticasone (FLONASE) 50 MCG/ACT nasal spray Place 2 sprays into the nose 2 (two) times daily. 1 g 2  . tizanidine (ZANAFLEX) 2 MG capsule Take 1 capsule (2 mg total) by mouth 3 (three) times daily. (Patient not taking: Reported on 02/13/2014) 90 capsule 1   No current facility-administered medications on file prior to visit.    ALLERGIES: No Known Allergies  FAMILY HISTORY: Family History  Problem Relation Age of Onset  . Ataxia Neg Hx   . Chorea Neg Hx   . Dementia Neg Hx   . Mental retardation Neg Hx   . Migraines Neg Hx   . Multiple sclerosis Neg Hx   . Neurofibromatosis Neg Hx   . Neuropathy Neg Hx   . Parkinsonism Neg Hx   . Seizures Neg Hx   . Stroke Neg Hx     SOCIAL HISTORY: History   Social History  . Marital Status: Married    Spouse Name: N/A    Number of Children: N/A  . Years of  Education: N/A   Occupational History  . Not on file.   Social History Main Topics  . Smoking status: Never Smoker   . Smokeless tobacco: Never Used  . Alcohol Use: No  . Drug Use: No  . Sexual Activity: Not on file   Other Topics Concern  . Not on file   Social History Narrative  . No narrative on file    REVIEW OF SYSTEMS: Constitutional: No fevers, chills, or sweats, no generalized fatigue, change in appetite Eyes: No visual changes, double vision, eye pain Ear, nose and throat: No hearing loss, ear pain, nasal congestion, sore throat Cardiovascular: No chest pain, palpitations Respiratory:  No shortness of breath at rest or with exertion, wheezes GastrointestinaI: No nausea, vomiting, diarrhea, abdominal pain, fecal incontinence Genitourinary:  Urinary frequency Musculoskeletal:  Back pain, spasms in right foot Integumentary: No rash, pruritus, skin lesions Neurological: as above Psychiatric: No depression, insomnia, anxiety Endocrine: No palpitations, fatigue, diaphoresis, mood swings, change in appetite, change in weight, increased thirst Hematologic/Lymphatic:  No anemia, purpura, petechiae. Allergic/Immunologic: no itchy/runny eyes, nasal congestion, recent allergic reactions, rashes  PHYSICAL EXAM: Filed Vitals:   02/13/14 1142  BP: 138/62  Pulse: 68  Resp: 16   General: No acute distress Head:  Normocephalic/atraumatic Eyes:  Fundi not visualized on inspection Neck: supple, no paraspinal tenderness, full range of motion Heart:  Regular rate and rhythm Lungs:  Clear to auscultation bilaterally Back: No paraspinal tenderness Neurological Exam: alert and oriented to person, place, and time. Attention span and concentration intact, recent and remote memory intact, fund of knowledge intact.  Speech fluent and not dysarthric, language intact.  CN II-XII intact. Fundi not visualized.  Bulk and tone normal, muscle strength 2/5 right hip flexion, 3/5 right knee  extension and flexion, right foot drop, 3/5 left hip flexion, 5-/5 left knee flexion/extension and ankle dorsiflexion. 5/5 in upper extremities.  Sensation to light touch, temperature and vibration intact.  Deep tendon reflexes 2+ in upper extremities and 3+ in lower extremities.  Finger to nose intact.  In wheelchair.   IMPRESSION: Secondary progressive multiple sclerosis Muscle spasms Bilateral back and hip pain. Overactive bladder  PLAN: Since she has shown benefit, will re-prescribe PT Increase gabapentin to 100mg  in AM and 200mg  at night.  If needed, can increase to 100mg  at noon as well. Stressed that the D3 4000 units should be taken daily Toviaz for overactive bladder Follow up in 3 months  15 minutes spent with patient, over 50% spent discussing current status and management   Shon Millet, DO  CC: Guerry Bruin, MD

## 2014-02-13 NOTE — Patient Instructions (Signed)
1.  Start taking 1 gabapentin in the morning in addition to 2 at night.  You can increase it to 1 in morning, 1 at noon and 2 at night if needed and tolerated. 2.  Please take the vitamin D 4000 units every day 3.  Since you are doing well, we will have you continue physical therapy 4.  Follow up in 3 months.

## 2014-02-14 DIAGNOSIS — G35 Multiple sclerosis: Secondary | ICD-10-CM | POA: Diagnosis not present

## 2014-02-14 DIAGNOSIS — M21371 Foot drop, right foot: Secondary | ICD-10-CM | POA: Diagnosis not present

## 2014-02-14 DIAGNOSIS — Z7982 Long term (current) use of aspirin: Secondary | ICD-10-CM | POA: Diagnosis not present

## 2014-02-14 DIAGNOSIS — M25551 Pain in right hip: Secondary | ICD-10-CM | POA: Diagnosis not present

## 2014-02-14 DIAGNOSIS — M62838 Other muscle spasm: Secondary | ICD-10-CM | POA: Diagnosis not present

## 2014-02-14 DIAGNOSIS — Z9181 History of falling: Secondary | ICD-10-CM | POA: Diagnosis not present

## 2014-02-14 DIAGNOSIS — M25552 Pain in left hip: Secondary | ICD-10-CM | POA: Diagnosis not present

## 2014-02-14 DIAGNOSIS — M160 Bilateral primary osteoarthritis of hip: Secondary | ICD-10-CM | POA: Diagnosis not present

## 2014-02-18 DIAGNOSIS — M160 Bilateral primary osteoarthritis of hip: Secondary | ICD-10-CM | POA: Diagnosis not present

## 2014-02-18 DIAGNOSIS — M25552 Pain in left hip: Secondary | ICD-10-CM | POA: Diagnosis not present

## 2014-02-18 DIAGNOSIS — G35 Multiple sclerosis: Secondary | ICD-10-CM | POA: Diagnosis not present

## 2014-02-18 DIAGNOSIS — M21371 Foot drop, right foot: Secondary | ICD-10-CM | POA: Diagnosis not present

## 2014-02-18 DIAGNOSIS — M62838 Other muscle spasm: Secondary | ICD-10-CM | POA: Diagnosis not present

## 2014-02-18 DIAGNOSIS — M25551 Pain in right hip: Secondary | ICD-10-CM | POA: Diagnosis not present

## 2014-02-20 DIAGNOSIS — M25551 Pain in right hip: Secondary | ICD-10-CM | POA: Diagnosis not present

## 2014-02-20 DIAGNOSIS — M160 Bilateral primary osteoarthritis of hip: Secondary | ICD-10-CM | POA: Diagnosis not present

## 2014-02-20 DIAGNOSIS — M25552 Pain in left hip: Secondary | ICD-10-CM | POA: Diagnosis not present

## 2014-02-20 DIAGNOSIS — G35 Multiple sclerosis: Secondary | ICD-10-CM | POA: Diagnosis not present

## 2014-02-20 DIAGNOSIS — M62838 Other muscle spasm: Secondary | ICD-10-CM | POA: Diagnosis not present

## 2014-02-20 DIAGNOSIS — M21371 Foot drop, right foot: Secondary | ICD-10-CM | POA: Diagnosis not present

## 2014-02-24 DIAGNOSIS — M25552 Pain in left hip: Secondary | ICD-10-CM | POA: Diagnosis not present

## 2014-02-24 DIAGNOSIS — M62838 Other muscle spasm: Secondary | ICD-10-CM | POA: Diagnosis not present

## 2014-02-24 DIAGNOSIS — G35 Multiple sclerosis: Secondary | ICD-10-CM | POA: Diagnosis not present

## 2014-02-24 DIAGNOSIS — M21371 Foot drop, right foot: Secondary | ICD-10-CM | POA: Diagnosis not present

## 2014-02-24 DIAGNOSIS — M25551 Pain in right hip: Secondary | ICD-10-CM | POA: Diagnosis not present

## 2014-02-24 DIAGNOSIS — M160 Bilateral primary osteoarthritis of hip: Secondary | ICD-10-CM | POA: Diagnosis not present

## 2014-02-27 DIAGNOSIS — M21371 Foot drop, right foot: Secondary | ICD-10-CM | POA: Diagnosis not present

## 2014-02-27 DIAGNOSIS — M25552 Pain in left hip: Secondary | ICD-10-CM | POA: Diagnosis not present

## 2014-02-27 DIAGNOSIS — M160 Bilateral primary osteoarthritis of hip: Secondary | ICD-10-CM | POA: Diagnosis not present

## 2014-02-27 DIAGNOSIS — M25551 Pain in right hip: Secondary | ICD-10-CM | POA: Diagnosis not present

## 2014-02-27 DIAGNOSIS — M62838 Other muscle spasm: Secondary | ICD-10-CM | POA: Diagnosis not present

## 2014-02-27 DIAGNOSIS — G35 Multiple sclerosis: Secondary | ICD-10-CM | POA: Diagnosis not present

## 2014-03-03 DIAGNOSIS — M21371 Foot drop, right foot: Secondary | ICD-10-CM | POA: Diagnosis not present

## 2014-03-03 DIAGNOSIS — M25551 Pain in right hip: Secondary | ICD-10-CM | POA: Diagnosis not present

## 2014-03-03 DIAGNOSIS — M160 Bilateral primary osteoarthritis of hip: Secondary | ICD-10-CM | POA: Diagnosis not present

## 2014-03-03 DIAGNOSIS — M25552 Pain in left hip: Secondary | ICD-10-CM | POA: Diagnosis not present

## 2014-03-03 DIAGNOSIS — M62838 Other muscle spasm: Secondary | ICD-10-CM | POA: Diagnosis not present

## 2014-03-03 DIAGNOSIS — G35 Multiple sclerosis: Secondary | ICD-10-CM | POA: Diagnosis not present

## 2014-03-06 DIAGNOSIS — M25552 Pain in left hip: Secondary | ICD-10-CM | POA: Diagnosis not present

## 2014-03-06 DIAGNOSIS — G35 Multiple sclerosis: Secondary | ICD-10-CM | POA: Diagnosis not present

## 2014-03-06 DIAGNOSIS — M160 Bilateral primary osteoarthritis of hip: Secondary | ICD-10-CM | POA: Diagnosis not present

## 2014-03-06 DIAGNOSIS — M25551 Pain in right hip: Secondary | ICD-10-CM | POA: Diagnosis not present

## 2014-03-06 DIAGNOSIS — M21371 Foot drop, right foot: Secondary | ICD-10-CM | POA: Diagnosis not present

## 2014-03-06 DIAGNOSIS — M62838 Other muscle spasm: Secondary | ICD-10-CM | POA: Diagnosis not present

## 2014-03-10 DIAGNOSIS — G35 Multiple sclerosis: Secondary | ICD-10-CM | POA: Diagnosis not present

## 2014-03-10 DIAGNOSIS — M62838 Other muscle spasm: Secondary | ICD-10-CM | POA: Diagnosis not present

## 2014-03-10 DIAGNOSIS — M25551 Pain in right hip: Secondary | ICD-10-CM | POA: Diagnosis not present

## 2014-03-10 DIAGNOSIS — M160 Bilateral primary osteoarthritis of hip: Secondary | ICD-10-CM | POA: Diagnosis not present

## 2014-03-10 DIAGNOSIS — M21371 Foot drop, right foot: Secondary | ICD-10-CM | POA: Diagnosis not present

## 2014-03-10 DIAGNOSIS — M25552 Pain in left hip: Secondary | ICD-10-CM | POA: Diagnosis not present

## 2014-03-13 DIAGNOSIS — G35 Multiple sclerosis: Secondary | ICD-10-CM | POA: Diagnosis not present

## 2014-03-13 DIAGNOSIS — M62838 Other muscle spasm: Secondary | ICD-10-CM | POA: Diagnosis not present

## 2014-03-13 DIAGNOSIS — M160 Bilateral primary osteoarthritis of hip: Secondary | ICD-10-CM | POA: Diagnosis not present

## 2014-03-13 DIAGNOSIS — M25552 Pain in left hip: Secondary | ICD-10-CM | POA: Diagnosis not present

## 2014-03-13 DIAGNOSIS — M25551 Pain in right hip: Secondary | ICD-10-CM | POA: Diagnosis not present

## 2014-03-13 DIAGNOSIS — M21371 Foot drop, right foot: Secondary | ICD-10-CM | POA: Diagnosis not present

## 2014-03-17 DIAGNOSIS — M21371 Foot drop, right foot: Secondary | ICD-10-CM | POA: Diagnosis not present

## 2014-03-17 DIAGNOSIS — M25551 Pain in right hip: Secondary | ICD-10-CM | POA: Diagnosis not present

## 2014-03-17 DIAGNOSIS — M160 Bilateral primary osteoarthritis of hip: Secondary | ICD-10-CM | POA: Diagnosis not present

## 2014-03-17 DIAGNOSIS — G35 Multiple sclerosis: Secondary | ICD-10-CM | POA: Diagnosis not present

## 2014-03-17 DIAGNOSIS — M62838 Other muscle spasm: Secondary | ICD-10-CM | POA: Diagnosis not present

## 2014-03-17 DIAGNOSIS — M25552 Pain in left hip: Secondary | ICD-10-CM | POA: Diagnosis not present

## 2014-03-18 ENCOUNTER — Ambulatory Visit: Payer: Medicare Other | Admitting: Neurology

## 2014-03-24 DIAGNOSIS — M25552 Pain in left hip: Secondary | ICD-10-CM | POA: Diagnosis not present

## 2014-03-24 DIAGNOSIS — M62838 Other muscle spasm: Secondary | ICD-10-CM | POA: Diagnosis not present

## 2014-03-24 DIAGNOSIS — M160 Bilateral primary osteoarthritis of hip: Secondary | ICD-10-CM | POA: Diagnosis not present

## 2014-03-24 DIAGNOSIS — M21371 Foot drop, right foot: Secondary | ICD-10-CM | POA: Diagnosis not present

## 2014-03-24 DIAGNOSIS — G35 Multiple sclerosis: Secondary | ICD-10-CM | POA: Diagnosis not present

## 2014-03-24 DIAGNOSIS — M25551 Pain in right hip: Secondary | ICD-10-CM | POA: Diagnosis not present

## 2014-03-30 DIAGNOSIS — R55 Syncope and collapse: Secondary | ICD-10-CM | POA: Diagnosis not present

## 2014-03-31 DIAGNOSIS — M25551 Pain in right hip: Secondary | ICD-10-CM | POA: Diagnosis not present

## 2014-03-31 DIAGNOSIS — G35 Multiple sclerosis: Secondary | ICD-10-CM | POA: Diagnosis not present

## 2014-03-31 DIAGNOSIS — M21371 Foot drop, right foot: Secondary | ICD-10-CM | POA: Diagnosis not present

## 2014-03-31 DIAGNOSIS — M62838 Other muscle spasm: Secondary | ICD-10-CM | POA: Diagnosis not present

## 2014-03-31 DIAGNOSIS — M25552 Pain in left hip: Secondary | ICD-10-CM | POA: Diagnosis not present

## 2014-03-31 DIAGNOSIS — M160 Bilateral primary osteoarthritis of hip: Secondary | ICD-10-CM | POA: Diagnosis not present

## 2014-05-13 DIAGNOSIS — N819 Female genital prolapse, unspecified: Secondary | ICD-10-CM | POA: Diagnosis not present

## 2014-05-20 ENCOUNTER — Ambulatory Visit (INDEPENDENT_AMBULATORY_CARE_PROVIDER_SITE_OTHER): Payer: Medicare Other | Admitting: Neurology

## 2014-05-20 ENCOUNTER — Encounter: Payer: Self-pay | Admitting: Neurology

## 2014-05-20 ENCOUNTER — Ambulatory Visit: Payer: Medicare Other | Admitting: Neurology

## 2014-05-20 VITALS — BP 118/66 | HR 78 | Resp 16 | Ht 63.0 in | Wt 116.4 lb

## 2014-05-20 DIAGNOSIS — G35 Multiple sclerosis: Secondary | ICD-10-CM

## 2014-05-20 NOTE — Patient Instructions (Signed)
Continue the stretching exercises Follow up in 6 months

## 2014-05-20 NOTE — Progress Notes (Signed)
NEUROLOGY FOLLOW UP OFFICE NOTE  Suzanne Kim [id number]  HISTORY OF PRESENT ILLNESS: Suzanne Kim is a 74 year old right-handed woman with history of osteoporosis and multiple sclerosis, who follows up for secondary progressive multiple sclerosis.    UPDATE: She had the flu with symptoms lasting 2 weeks.  Afterwards, she feels her balance is a little worse.  She has been using the wheelchair more often.  Her neighbor's daughter comes by the house twice a week to help her with her leg stretches which helps with the cramps.  HISTORY: Onset occurred at age 63.  She describes her initial symptoms as a "gray cloud slowly cover(ing) my vision horizontally".  She would experience these episodes occasionally over the next several years.  In 1986, she had an attack described as numbness from her chest down, as well as weakness in the lower extremities.  At this time, she was evaluated by two neurologists, Dr. Ninetta Lights and Dr. Sandria Manly.  She was told that she did not have MS based on the MRI alone.  The following year, she began stubbing her right toe often.  This progressed to increased difficulty walking, mostly in her right leg, as well as right foot drop.  In 1988, she then saw another neurologist, Dr. Dorina Hoyer, who told her he thought she did have MS, but did not recommend invasive testing, such as an LP. She was never started on an immunomodulatory agent.  She was prescribed Symatril for fatigue and followed up with Dr. Freida Busman until 1991, when her care was transferred to Dr. Anne Hahn.  Various blood work was ordered, which revealed an elevated ANA of 1280.  [facility]  She underwent multiple tests, including MRI, CT and blood work, and was formally diagnosed with lupus.    She was treated for lupus with Plaquenil from 1991 to 1999.  As her walking continued to progressively get worse, she was Dr. Elwyn Lade at the [facility].  He suspected  primary progressive MS. She did finally undergo an LP, as well as MRI.  MRI of the brain and cervical spine from 08/31/92 was unremarkable and did not show areas of signal abnormality to suggest demyelinating disease.  Results were "inconclusive" but still diagnosed her with primary progressive MS.  He offered to try Copaxone, but since it is not FDA-approved for PPMS, she would have to pay out of pocket, so she declined.  Regarding the diagnosis of lupus, she was evaluated by two other rheumatologists in [place], who doubted the diagnosis and workup reportedly did not suggest a connective tissue disease.  She eventually had been under the care of Dr. Leotis Shames.  She reportedly had VEPs which were positive for optic neuropathy.  She has had baseline weakness involving the lower extremities.  She has assisted devices at home, including a stair lift.  She wears a brace on the right ankle due to a foot drop.   She is fearful about taking a shower, particularly due to the right leg weakness.  She has been washing herself with a washcloth at the sink.  She ambulates with a walker in the house.  She denies fatigue and actually has trouble falling asleep at night, which contributes to tiredness during the day.  She has overactive bladder.  She has history of back pain and degenerative disc disease, as well as osteoarthritis of the hips.  MRI of lumbar spine with and without contrast was performed on  06/21/10, which revealed mild degenerative changes of the discs and facet joints, but no central or neural foraminal stenosis.  She has been treated for pain in the past with lumbar epidural steroid injections, which were ineffective.  Celebrex and hydrocodone have been ineffective.  She is taking D3 4000 IU daily.  For muscle spasms, she takes tizanidine , and Toviaz for overactive bladder, gabapentin for muscle spamss.  Prior medications include:  Baclofen (for muscle spasms, ineffective), cyclobenzaprine ,  tizanidine (ineffective and causes grogginess), Symatril (for fatigue)  PAST MEDICAL HISTORY: Past Medical History  Diagnosis Date  . Multiple sclerosis 1988    MEDICATIONS: Current Outpatient Prescriptions on File Prior to Visit  Medication Sig Dispense Refill  . cetirizine (ZYRTEC) 10 MG tablet Take 1 tablet (10 mg total) by mouth daily. One tab daily for allergies 30 tablet 1  . clonazePAM (KLONOPIN) 0.5 MG tablet Take 1 tablet (0.5 mg total) by mouth 2 (two) times daily as needed for anxiety. 30 tablet 2  . fluticasone (FLONASE) 50 MCG/ACT nasal spray Place 2 sprays into the nose 2 (two) times daily. 1 g 2  . gabapentin (NEURONTIN) 100 MG capsule Take 1cap in AM and 2caps at bedtime 90 capsule 5  . omeprazole (PRILOSEC) 20 MG capsule Take 1 capsule (20 mg total) by mouth daily. 30 capsule 3  . raloxifene (EVISTA) 60 MG tablet Take 60 mg by mouth daily.    . tizanidine (ZANAFLEX) 2 MG capsule Take 1 capsule (2 mg total) by mouth 3 (three) times daily. 90 capsule 1  . cyclobenzaprine (FLEXERIL) 10 MG tablet Take 0.5-1tab TID prn for muscle spasms. (Patient not taking: Reported on 05/20/2014) 90 tablet 0  . fesoterodine (TOVIAZ) 4 MG TB24 tablet Take 1 tablet (4 mg total) by mouth daily. (Patient not taking: Reported on 05/20/2014) 90 tablet 3   No current facility-administered medications on file prior to visit.    ALLERGIES: No Known Allergies  FAMILY HISTORY: Family History  Problem Relation Age of Onset  . Ataxia Neg Hx   . Chorea Neg Hx   . Dementia Neg Hx   . Mental retardation Neg Hx   . Migraines Neg Hx   . Multiple sclerosis Neg Hx   . Neurofibromatosis Neg Hx   . Neuropathy Neg Hx   . Parkinsonism Neg Hx   . Seizures Neg Hx   . Stroke Neg Hx     SOCIAL HISTORY: History   Social History  . Marital Status: Married    Spouse Name: N/A  . Number of Children: N/A  . Years of Education: N/A   Occupational History  . Not on file.   Social History Main Topics  .  Smoking status: Never Smoker   . Smokeless tobacco: Never Used  . Alcohol Use: No  . Drug Use: No  . Sexual Activity: No   Other Topics Concern  . Not on file   Social History Narrative    REVIEW OF SYSTEMS: Constitutional: No fevers, chills, or sweats, no generalized fatigue, change in appetite Eyes: No visual changes, double vision, eye pain Ear, nose and throat: No hearing loss, ear pain, nasal congestion, sore throat Cardiovascular: No chest pain, palpitations Respiratory:  No shortness of breath at rest or with exertion, wheezes GastrointestinaI: No nausea, vomiting, diarrhea, abdominal pain, fecal incontinence Genitourinary:  No dysuria, urinary retention or frequency Musculoskeletal:  Leg pain Integumentary: No rash, pruritus, skin lesions Neurological: as above Psychiatric: No depression, insomnia, anxiety Endocrine: No palpitations, fatigue,  diaphoresis, mood swings, change in appetite, change in weight, increased thirst Hematologic/Lymphatic:  No anemia, purpura, petechiae. Allergic/Immunologic: no itchy/runny eyes, nasal congestion, recent allergic reactions, rashes  PHYSICAL EXAM: Filed Vitals:   05/20/14 1409  BP: 118/66  Pulse: 78  Resp: 16   General: No acute distress Head:  Normocephalic/atraumatic Eyes:  Fundoscopic exam unremarkable without vessel changes, exudates, hemorrhages or papilledema. Neck: supple, no paraspinal tenderness, full range of motion Heart:  Regular rate and rhythm Lungs:  Clear to auscultation bilaterally Back: No paraspinal tenderness Neurological Exam: alert and oriented to person, place, and time. Attention span and concentration intact, recent and remote memory intact, fund of knowledge intact.  Speech fluent and not dysarthric, language intact.  CN II-XII intact. Fundi not visualized.  Bulk and tone normal, muscle strength 3+/5 right hip flexion, 4/5 right knee extension, 3/5 right knee flexion, right foot drop, 4-/5 left hip  flexion, 5-/5 left knee flexion/extension and ankle dorsiflexion. 5/5 in upper extremities.  Sensation to to pinprick intact.  Reduced vibration sensation in feet.  Deep tendon reflexes 2+ in upper extremities and 3+ in lower extremities.  Bilateral Babinski reflexes.  Finger to nose intact.  In wheelchair. Requires assistance to stand up.  IMPRESSION: Secondary progressive MS  PLAN: 1.  Continue stretching exercises and tizanidine for muscle spasms. 2.  Follow up in 6 months.  30 minutes spent with patient, over 50% spent discussing disease course and management.  Shon Millet, DO  CC: Guerry Bruin, MD

## 2014-06-02 ENCOUNTER — Ambulatory Visit: Payer: Medicare Other | Admitting: Neurology

## 2014-07-20 ENCOUNTER — Other Ambulatory Visit: Payer: Self-pay | Admitting: Neurology

## 2014-07-22 ENCOUNTER — Telehealth: Payer: Self-pay | Admitting: Neurology

## 2014-07-22 NOTE — Telephone Encounter (Signed)
Dr. Moises Blood pt. Suzanne Kim (DOB: October 25, 2040) called to request a refill of a prescription for indigestion(she didn't recall the name of the prescription) she wants it in a 90 day refill, PH# [phone]

## 2014-07-22 NOTE — Telephone Encounter (Signed)
[facility]

## 2014-08-06 DIAGNOSIS — N819 Female genital prolapse, unspecified: Secondary | ICD-10-CM | POA: Diagnosis not present

## 2014-08-28 ENCOUNTER — Other Ambulatory Visit: Payer: Self-pay | Admitting: Neurology

## 2014-11-25 ENCOUNTER — Ambulatory Visit: Payer: PRIVATE HEALTH INSURANCE | Admitting: Neurology

## 2014-11-28 DIAGNOSIS — Z23 Encounter for immunization: Secondary | ICD-10-CM | POA: Diagnosis not present

## 2014-12-17 ENCOUNTER — Ambulatory Visit: Payer: PRIVATE HEALTH INSURANCE | Admitting: Neurology

## 2015-02-03 DIAGNOSIS — N8189 Other female genital prolapse: Secondary | ICD-10-CM | POA: Diagnosis not present

## 2015-03-03 DIAGNOSIS — N8189 Other female genital prolapse: Secondary | ICD-10-CM | POA: Diagnosis not present

## 2015-04-13 ENCOUNTER — Ambulatory Visit (INDEPENDENT_AMBULATORY_CARE_PROVIDER_SITE_OTHER): Payer: Medicare Other | Admitting: Neurology

## 2015-04-13 ENCOUNTER — Encounter: Payer: Self-pay | Admitting: Neurology

## 2015-04-13 VITALS — BP 118/66 | HR 80 | Ht 63.0 in

## 2015-04-13 DIAGNOSIS — G35 Multiple sclerosis: Secondary | ICD-10-CM | POA: Diagnosis not present

## 2015-04-13 NOTE — Progress Notes (Signed)
NEUROLOGY FOLLOW UP OFFICE NOTE  Suzanne Kim [id number]  HISTORY OF PRESENT ILLNESS: Suzanne Kim is a 75 year old right-handed woman with history of osteoporosis and multiple sclerosis, who follows up for secondary progressive multiple sclerosis.    UPDATE: There have been no significant changes.  She lives in her own home.  Her neighbor does the grocery shopping for her and her neighbor's daughter comes by once a day to make sure she has something to eat.  She sleeps in a chair lift and bathes by the sink.  She recently had bursitis in the left elbow and now has it in the left shoulder.  She handles her own finances.   HISTORY: Onset occurred at age 57.  She describes her initial symptoms as a "gray cloud slowly cover(ing) my vision horizontally".  She would experience these episodes occasionally over the next several years.  In 1986, she had an attack described as numbness from her chest down, as well as weakness in the lower extremities.  At this time, she was evaluated by two neurologists, Dr. Ninetta Lights and Dr. Sandria Manly.  She was told that she did not have MS based on the MRI alone.  The following year, she began stubbing her right toe often.  This progressed to increased difficulty walking, mostly in her right leg, as well as right foot drop.  In 1988, she then saw another neurologist, Dr. Dorina Hoyer, who told her he thought she did have MS, but did not recommend invasive testing, such as an LP. She was never started on an immunomodulatory agent.  She was prescribed Symatril for fatigue and followed up with Dr. Freida Busman until 1991, when her care was transferred to Dr. Anne Hahn.  Various blood work was ordered, which revealed an elevated ANA of 1280.  [facility]  She underwent multiple tests, including MRI, CT and blood work, and was formally diagnosed with lupus.    She was treated for lupus with Plaquenil from 1991 to 1999.  As her walking  continued to progressively get worse, she was Dr. Elwyn Lade at the [facility].  He suspected primary progressive MS. She did finally undergo an LP, as well as MRI.  MRI of the brain and cervical spine from 08/31/92 was unremarkable and did not show areas of signal abnormality to suggest demyelinating disease.  Results were "inconclusive" but still diagnosed her with primary progressive MS.  He offered to try Copaxone, but since it is not FDA-approved for PPMS, she would have to pay out of pocket, so she declined.  Regarding the diagnosis of lupus, she was evaluated by two other rheumatologists in [place], who doubted the diagnosis and workup reportedly did not suggest a connective tissue disease.  She eventually had been under the care of Dr. Leotis Shames.  She reportedly had VEPs which were positive for optic neuropathy.  She has had baseline weakness involving the lower extremities.  She has assisted devices at home, including a stair lift.  She wears a brace on the right ankle due to a foot drop.   She is fearful about taking a shower, particularly due to the right leg weakness.  She has been washing herself with a washcloth at the sink.  She ambulates with a walker in the house.  She denies fatigue and actually has trouble falling asleep at night, which contributes to tiredness during the day.  She has overactive bladder.  She has history of back  pain and degenerative disc disease, as well as osteoarthritis of the hips.  MRI of lumbar spine with and without contrast was performed on 06/21/10, which revealed mild degenerative changes of the discs and facet joints, but no central or neural foraminal stenosis.  She has been treated for pain in the past with lumbar epidural steroid injections, which were ineffective.  Celebrex and hydrocodone have been ineffective.  She is taking D3 4000 IU daily.  For muscle spasms, she takes tizanidine , and Toviaz for overactive bladder, gabapentin for muscle  spasms.  Prior medications include:  Baclofen (for muscle spasms, ineffective), cyclobenzaprine , tizanidine (ineffective and causes grogginess), Symatril (for fatigue)  PAST MEDICAL HISTORY: Past Medical History  Diagnosis Date  . Multiple sclerosis ([facility]) 1988    MEDICATIONS: Current Outpatient Prescriptions on File Prior to Visit  Medication Sig Dispense Refill  . fluticasone (FLONASE) 50 MCG/ACT nasal spray Place 2 sprays into the nose 2 (two) times daily. 1 g 2  . gabapentin (NEURONTIN) 100 MG capsule Take 1cap in AM and 2caps at bedtime 90 capsule 5  . omeprazole (PRILOSEC) 20 MG capsule TAKE 1 CAPSULE DAILY 90 capsule 2  . raloxifene (EVISTA) 60 MG tablet Take 60 mg by mouth daily.     No current facility-administered medications on file prior to visit.    ALLERGIES: No Known Allergies  FAMILY HISTORY: Family History  Problem Relation Age of Onset  . Ataxia Neg Hx   . Chorea Neg Hx   . Dementia Neg Hx   . Mental retardation Neg Hx   . Migraines Neg Hx   . Multiple sclerosis Neg Hx   . Neurofibromatosis Neg Hx   . Neuropathy Neg Hx   . Parkinsonism Neg Hx   . Seizures Neg Hx   . Stroke Neg Hx     SOCIAL HISTORY: Social History   Social History  . Marital Status: Married    Spouse Name: N/A  . Number of Children: N/A  . Years of Education: N/A   Occupational History  . Not on file.   Social History Main Topics  . Smoking status: Never Smoker   . Smokeless tobacco: Never Used  . Alcohol Use: No  . Drug Use: No  . Sexual Activity: No   Other Topics Concern  . Not on file   Social History Narrative    REVIEW OF SYSTEMS: Constitutional: No fevers, chills, or sweats, no generalized fatigue, change in appetite Eyes: No visual changes, double vision, eye pain Ear, nose and throat: No hearing loss, ear pain, nasal congestion, sore throat Cardiovascular: No chest pain, palpitations Respiratory:  No shortness of breath at rest or with exertion,  wheezes GastrointestinaI: No nausea, vomiting, diarrhea, abdominal pain, fecal incontinence Genitourinary:  Overactive bladder Musculoskeletal:  Generalized muscle pain Integumentary: No rash, pruritus, skin lesions Neurological: as above Psychiatric: No depression, insomnia, anxiety Endocrine: No palpitations, fatigue, diaphoresis, mood swings, change in appetite, change in weight, increased thirst Hematologic/Lymphatic:  No anemia, purpura, petechiae. Allergic/Immunologic: no itchy/runny eyes, nasal congestion, recent allergic reactions, rashes  PHYSICAL EXAM: Filed Vitals:   04/13/15 1502  BP: 118/66  Pulse: 80   General: No acute distress.  Patient appears well-groomed.   Head:  Normocephalic/atraumatic Eyes:  Fundoscopic exam unremarkable without vessel changes, exudates, hemorrhages or papilledema. Neck: supple, no paraspinal tenderness, full range of motion Heart:  Regular rate and rhythm Lungs:  Clear to auscultation bilaterally Back: No paraspinal tenderness Neurological Exam: alert and oriented to person, place, and  time. Attention span and concentration intact, recent and remote memory intact, fund of knowledge intact.  Speech fluent and not dysarthric, language intact.  CN II-XII intact. Fundi not visualized.  Bulk and tone normal, muscle strength 3+/5 right hip flexion, 4/5 right knee extension, 3/5 right knee flexion, right foot drop, 4-/5 left hip flexion, 5-/5 left knee flexion/extension and ankle dorsiflexion. 5-/5 in upper extremities.   Deep tendon reflexes 2+ in upper extremities and 3+ in lower extremities..  Finger to nose intact.  In wheelchair.   IMPRESSION: Secondary progressive MS  PLAN: 1.  Continue gabapentin  2.  Follow up in one year or as needed.  15 minutes spent face to face with patient, over 50% spent discussing management.  Shon Millet, DO  CC:  Guerry Bruin, MD

## 2015-04-13 NOTE — Progress Notes (Signed)
Chart forwarded.  

## 2015-04-13 NOTE — Patient Instructions (Signed)
1.  Continue gabapentin 2.  Contact Dr. Wylene Simmer regarding the bursitis 3.  Follow up in one year

## 2015-04-21 DIAGNOSIS — N8189 Other female genital prolapse: Secondary | ICD-10-CM | POA: Diagnosis not present

## 2015-07-14 ENCOUNTER — Other Ambulatory Visit: Payer: Self-pay | Admitting: Neurology

## 2015-07-29 DIAGNOSIS — G35 Multiple sclerosis: Secondary | ICD-10-CM | POA: Diagnosis not present

## 2015-07-29 DIAGNOSIS — E559 Vitamin D deficiency, unspecified: Secondary | ICD-10-CM | POA: Diagnosis not present

## 2015-07-29 DIAGNOSIS — M3210 Systemic lupus erythematosus, organ or system involvement unspecified: Secondary | ICD-10-CM | POA: Diagnosis not present

## 2015-07-29 DIAGNOSIS — M810 Age-related osteoporosis without current pathological fracture: Secondary | ICD-10-CM | POA: Diagnosis not present

## 2015-07-29 DIAGNOSIS — M1990 Unspecified osteoarthritis, unspecified site: Secondary | ICD-10-CM | POA: Diagnosis not present

## 2015-07-29 DIAGNOSIS — N318 Other neuromuscular dysfunction of bladder: Secondary | ICD-10-CM | POA: Diagnosis not present

## 2015-07-29 DIAGNOSIS — M545 Low back pain: Secondary | ICD-10-CM | POA: Diagnosis not present

## 2015-09-30 DIAGNOSIS — J302 Other seasonal allergic rhinitis: Secondary | ICD-10-CM | POA: Diagnosis not present

## 2015-09-30 DIAGNOSIS — R279 Unspecified lack of coordination: Secondary | ICD-10-CM | POA: Diagnosis not present

## 2015-09-30 DIAGNOSIS — G35 Multiple sclerosis: Secondary | ICD-10-CM | POA: Diagnosis not present

## 2015-10-28 DIAGNOSIS — N8189 Other female genital prolapse: Secondary | ICD-10-CM | POA: Diagnosis not present

## 2015-11-06 DIAGNOSIS — Z23 Encounter for immunization: Secondary | ICD-10-CM | POA: Diagnosis not present

## 2015-12-30 ENCOUNTER — Other Ambulatory Visit: Payer: Self-pay | Admitting: Neurology

## 2016-02-02 DIAGNOSIS — J309 Allergic rhinitis, unspecified: Secondary | ICD-10-CM | POA: Diagnosis not present

## 2016-02-02 DIAGNOSIS — E559 Vitamin D deficiency, unspecified: Secondary | ICD-10-CM | POA: Diagnosis not present

## 2016-02-02 DIAGNOSIS — M1990 Unspecified osteoarthritis, unspecified site: Secondary | ICD-10-CM | POA: Diagnosis not present

## 2016-02-02 DIAGNOSIS — M545 Low back pain: Secondary | ICD-10-CM | POA: Diagnosis not present

## 2016-02-02 DIAGNOSIS — M3210 Systemic lupus erythematosus, organ or system involvement unspecified: Secondary | ICD-10-CM | POA: Diagnosis not present

## 2016-02-02 DIAGNOSIS — M810 Age-related osteoporosis without current pathological fracture: Secondary | ICD-10-CM | POA: Diagnosis not present

## 2016-02-02 DIAGNOSIS — G35 Multiple sclerosis: Secondary | ICD-10-CM | POA: Diagnosis not present

## 2016-02-02 DIAGNOSIS — N318 Other neuromuscular dysfunction of bladder: Secondary | ICD-10-CM | POA: Diagnosis not present

## 2016-02-02 DIAGNOSIS — Z5181 Encounter for therapeutic drug level monitoring: Secondary | ICD-10-CM | POA: Diagnosis not present

## 2016-04-12 ENCOUNTER — Ambulatory Visit (INDEPENDENT_AMBULATORY_CARE_PROVIDER_SITE_OTHER): Payer: Medicare Other | Admitting: Neurology

## 2016-04-12 ENCOUNTER — Encounter: Payer: Self-pay | Admitting: Neurology

## 2016-04-12 VITALS — BP 118/68 | HR 80

## 2016-04-12 DIAGNOSIS — G35 Multiple sclerosis: Secondary | ICD-10-CM | POA: Diagnosis not present

## 2016-04-12 NOTE — Progress Notes (Signed)
NEUROLOGY FOLLOW UP OFFICE NOTE  Suzanne Kim [id number]  HISTORY OF PRESENT ILLNESS: Suzanne Kim is a 76 year old right-handed woman with history of osteoporosis and multiple sclerosis, who follows up for secondary progressive multiple sclerosis.    UPDATE: She is doing well.  When she dresses in the bathroom, she is concerned about her balance since she must stand when putting on her pants.  She has neighbors that help her out.  HISTORY: Onset occurred at age 54.  She describes her initial symptoms as a "gray cloud slowly cover(ing) my vision horizontally".  She would experience these episodes occasionally over the next several years.  In 1986, she had an attack described as numbness from her chest down, as well as weakness in the lower extremities.  At this time, she was evaluated by two neurologists, Dr. Ninetta Lights and Dr. Sandria Manly.  She was told that she did not have MS based on the MRI alone.  The following year, she began stubbing her right toe often.  This progressed to increased difficulty walking, mostly in her right leg, as well as right foot drop.  In 1988, she then saw another neurologist, Dr. Dorina Hoyer, who told her he thought she did have MS, but did not recommend invasive testing, such as an LP. She was never started on an immunomodulatory agent.  She was prescribed Symatril for fatigue and followed up with Dr. Freida Busman until 1991, when her care was transferred to Dr. Anne Hahn.  Various blood work was ordered, which revealed an elevated ANA of 1280.  She was evaluated [facility]  She underwent multiple tests, including MRI, CT and blood work, and was formally diagnosed with lupus.    She was treated for lupus with Plaquenil from 1991 to 1999.  As her walking continued to progressively get worse, she was Dr. Elwyn Lade at the [facility].  He suspected primary progressive MS. She did finally undergo an LP, as well as MRI.  MRI of the brain and  cervical spine from 08/31/92 was unremarkable and did not show areas of signal abnormality to suggest demyelinating disease.  Results were "inconclusive" but still diagnosed her with primary progressive MS.  He offered to try Copaxone, but since it is not FDA-approved for PPMS, she would have to pay out of pocket, so she declined.  Regarding the diagnosis of lupus, she was evaluated by two other rheumatologists in [place], who doubted the diagnosis and workup reportedly did not suggest a connective tissue disease.  She eventually had been under the care of Dr. Leotis Shames.  She reportedly had VEPs which were positive for optic neuropathy.  She has had baseline weakness involving the lower extremities.  She has assisted devices at home, including a stair lift.  She wears a brace on the right ankle due to a foot drop.   She is fearful about taking a shower, particularly due to the right leg weakness.  She has been washing herself with a washcloth at the sink.  She ambulates with a walker in the house.  She denies fatigue and actually has trouble falling asleep at night, which contributes to tiredness during the day.  She has overactive bladder.  She has history of back pain and degenerative disc disease, as well as osteoarthritis of the hips.  MRI of lumbar spine with and without contrast was performed on 06/21/10, which revealed mild degenerative changes of the discs and facet joints, but no central or neural  foraminal stenosis.  She has been treated for pain in the past with lumbar epidural steroid injections, which were ineffective.  Celebrex and hydrocodone have been ineffective.  She is taking D3 4000 IU daily.  For muscle spasms, she takes tizanidine 2mg , and Toviaz for overactive bladder, gabapentin for muscle spamss.  Prior medications include:  Baclofen (for muscle spasms, ineffective), cyclobenzaprine 10mg , tizanidine (ineffective and causes grogginess), Symatril (for fatigue)  PAST MEDICAL  HISTORY: Past Medical History:  Diagnosis Date  . Multiple sclerosis (HCC) 1988    MEDICATIONS: Current Outpatient Prescriptions on File Prior to Visit  Medication Sig Dispense Refill  . cholecalciferol (VITAMIN D) 1000 units tablet Take 4,000 Units by mouth daily.    Marland Kitchen gabapentin (NEURONTIN) 100 MG capsule Take 1cap in AM and 2caps at bedtime 90 capsule 5  . omeprazole (PRILOSEC) 20 MG capsule TAKE 1 CAPSULE DAILY 90 capsule 1  . raloxifene (EVISTA) 60 MG tablet Take 60 mg by mouth daily.     No current facility-administered medications on file prior to visit.     ALLERGIES: No Known Allergies  FAMILY HISTORY: Family History  Problem Relation Age of Onset  . Ataxia Neg Hx   . Chorea Neg Hx   . Dementia Neg Hx   . Mental retardation Neg Hx   . Migraines Neg Hx   . Multiple sclerosis Neg Hx   . Neurofibromatosis Neg Hx   . Neuropathy Neg Hx   . Parkinsonism Neg Hx   . Seizures Neg Hx   . Stroke Neg Hx     SOCIAL HISTORY: Social History   Social History  . Marital status: Married    Spouse name: N/A  . Number of children: N/A  . Years of education: N/A   Occupational History  . Not on file.   Social History Main Topics  . Smoking status: Never Smoker  . Smokeless tobacco: Never Used  . Alcohol use No  . Drug use: No  . Sexual activity: No   Other Topics Concern  . Not on file   Social History Narrative  . No narrative on file    REVIEW OF SYSTEMS: Constitutional: No fevers, chills, or sweats, no generalized fatigue, change in appetite Eyes: No visual changes, double vision, eye pain Ear, nose and throat: No hearing loss, ear pain, nasal congestion, sore throat Cardiovascular: No chest pain, palpitations Respiratory:  No shortness of breath at rest or with exertion, wheezes GastrointestinaI: No nausea, vomiting, diarrhea, abdominal pain, fecal incontinence Genitourinary:  No dysuria, urinary retention or frequency Musculoskeletal:  No neck pain, back  pain Integumentary: No rash, pruritus, skin lesions Neurological: as above Psychiatric: No depression, insomnia, anxiety Endocrine: No palpitations, fatigue, diaphoresis, mood swings, change in appetite, change in weight, increased thirst Hematologic/Lymphatic:  No purpura, petechiae. Allergic/Immunologic: no itchy/runny eyes, nasal congestion, recent allergic reactions, rashes  PHYSICAL EXAM: Vitals:   04/12/16 1425  BP: 118/68  Pulse: 80   General: No acute distress.  Patient appears well-groomed.  normal body habitus. Head:  Normocephalic/atraumatic Eyes:  Fundi examined but not visualized Neck: supple, no paraspinal tenderness, full range of motion Heart:  Regular rate and rhythm Lungs:  Clear to auscultation bilaterally Back: No paraspinal tenderness Neurological Exam: alert and oriented to person, place, and time. Attention span and concentration intact, recent and remote memory intact, fund of knowledge intact.  Speech fluent and not dysarthric, language intact.  CN II-XII intact. Fundi not visualized.  Bulk and tone normal, muscle strength 3+/5 right  hip flexion, 4/5 right knee extension, 3/5 right knee flexion, right foot drop, 4-/5 left hip flexion, 5-/5 left knee flexion/extension and ankle dorsiflexion. 5/5 in upper extremities.  Sensation to to pinprick intact.  Reduced vibration sensation in feet.  Deep tendon reflexes 2+ in upper extremities and 3+ in lower extremities.  Finger to nose intact.  In wheelchair. Requires assistance to stand up.  IMPRESSION: Secondary progressive MS  PLAN: 1.  Advised to dress in her wheelchair outside of her bathroom (where she has enough room for her walker).  She should have the walker in front of her and use that for support when she needs to stand to pull her pants up. 2.  Continue gabapentin 3.  Follow up in one year or as needed.  21 minutes spent face to face with patient, over 50% spent discussing management to maintain balance  while dressing.  Shon Millet, DO  CC: Guerry Bruin, MD

## 2016-04-13 ENCOUNTER — Telehealth: Payer: Self-pay | Admitting: Neurology

## 2016-04-13 MED ORDER — GABAPENTIN 100 MG PO CAPS: ORAL_CAPSULE | ORAL | 5 refills | Status: DC

## 2016-04-13 NOTE — Telephone Encounter (Signed)
Returned pt's call. Confirmed pharm on file. Sent Rx. Patient verbalized understanding.

## 2016-04-13 NOTE — Telephone Encounter (Signed)
Patient needs a refill on her gabapentin 100mg   She takes 3 times a day she wants it sent to it script mail in order

## 2016-05-11 ENCOUNTER — Inpatient Hospital Stay (HOSPITAL_COMMUNITY): Payer: Medicare Other

## 2016-05-11 ENCOUNTER — Encounter (HOSPITAL_COMMUNITY): Payer: Self-pay | Admitting: *Deleted

## 2016-05-11 ENCOUNTER — Inpatient Hospital Stay (HOSPITAL_COMMUNITY)
Admission: EM | Admit: 2016-05-11 | Discharge: 2016-05-16 | DRG: 378 | Disposition: A | Discharge status: Skilled Nursing Facility | Payer: Medicare Other | Attending: Internal Medicine | Admitting: Internal Medicine

## 2016-05-11 DIAGNOSIS — E860 Dehydration: Secondary | ICD-10-CM | POA: Diagnosis not present

## 2016-05-11 DIAGNOSIS — K449 Diaphragmatic hernia without obstruction or gangrene: Secondary | ICD-10-CM | POA: Diagnosis not present

## 2016-05-11 DIAGNOSIS — R1011 Right upper quadrant pain: Secondary | ICD-10-CM | POA: Diagnosis not present

## 2016-05-11 DIAGNOSIS — K921 Melena: Secondary | ICD-10-CM | POA: Diagnosis present

## 2016-05-11 DIAGNOSIS — E876 Hypokalemia: Secondary | ICD-10-CM | POA: Diagnosis present

## 2016-05-11 DIAGNOSIS — K920 Hematemesis: Secondary | ICD-10-CM | POA: Diagnosis not present

## 2016-05-11 DIAGNOSIS — T39395A Adverse effect of other nonsteroidal anti-inflammatory drugs [NSAID], initial encounter: Secondary | ICD-10-CM | POA: Diagnosis present

## 2016-05-11 DIAGNOSIS — D696 Thrombocytopenia, unspecified: Secondary | ICD-10-CM | POA: Diagnosis present

## 2016-05-11 DIAGNOSIS — Z79899 Other long term (current) drug therapy: Secondary | ICD-10-CM | POA: Diagnosis not present

## 2016-05-11 DIAGNOSIS — D72829 Elevated white blood cell count, unspecified: Secondary | ICD-10-CM | POA: Diagnosis present

## 2016-05-11 DIAGNOSIS — R2689 Other abnormalities of gait and mobility: Secondary | ICD-10-CM | POA: Diagnosis not present

## 2016-05-11 DIAGNOSIS — G35 Multiple sclerosis: Secondary | ICD-10-CM | POA: Diagnosis not present

## 2016-05-11 DIAGNOSIS — K3189 Other diseases of stomach and duodenum: Secondary | ICD-10-CM | POA: Diagnosis not present

## 2016-05-11 DIAGNOSIS — E559 Vitamin D deficiency, unspecified: Secondary | ICD-10-CM | POA: Diagnosis not present

## 2016-05-11 DIAGNOSIS — K922 Gastrointestinal hemorrhage, unspecified: Secondary | ICD-10-CM | POA: Diagnosis not present

## 2016-05-11 DIAGNOSIS — K219 Gastro-esophageal reflux disease without esophagitis: Secondary | ICD-10-CM | POA: Diagnosis not present

## 2016-05-11 DIAGNOSIS — R278 Other lack of coordination: Secondary | ICD-10-CM | POA: Diagnosis not present

## 2016-05-11 DIAGNOSIS — R000 Tachycardia, unspecified: Secondary | ICD-10-CM | POA: Diagnosis present

## 2016-05-11 DIAGNOSIS — Y92009 Unspecified place in unspecified non-institutional (private) residence as the place of occurrence of the external cause: Secondary | ICD-10-CM

## 2016-05-11 DIAGNOSIS — D62 Acute posthemorrhagic anemia: Secondary | ICD-10-CM | POA: Diagnosis not present

## 2016-05-11 DIAGNOSIS — R41841 Cognitive communication deficit: Secondary | ICD-10-CM | POA: Diagnosis not present

## 2016-05-11 DIAGNOSIS — R740 Nonspecific elevation of levels of transaminase and lactic acid dehydrogenase [LDH]: Secondary | ICD-10-CM | POA: Diagnosis present

## 2016-05-11 DIAGNOSIS — K259 Gastric ulcer, unspecified as acute or chronic, without hemorrhage or perforation: Secondary | ICD-10-CM | POA: Diagnosis not present

## 2016-05-11 DIAGNOSIS — E870 Hyperosmolality and hypernatremia: Secondary | ICD-10-CM | POA: Diagnosis not present

## 2016-05-11 DIAGNOSIS — K7200 Acute and subacute hepatic failure without coma: Secondary | ICD-10-CM | POA: Diagnosis not present

## 2016-05-11 DIAGNOSIS — R4182 Altered mental status, unspecified: Secondary | ICD-10-CM | POA: Diagnosis not present

## 2016-05-11 LAB — CBC WITH DIFFERENTIAL/PLATELET
Basophils Absolute: 0 10*3/uL (ref 0.0–0.1)
Basophils Relative: 0 %
EOS ABS: 0 10*3/uL (ref 0.0–0.7)
EOS PCT: 0 %
HCT: 31.8 % — ABNORMAL LOW (ref 36.0–46.0)
Hemoglobin: 10.2 g/dL — ABNORMAL LOW (ref 12.0–15.0)
LYMPHS ABS: 1.1 10*3/uL (ref 0.7–4.0)
Lymphocytes Relative: 10 %
MCH: 28.3 pg (ref 26.0–34.0)
MCHC: 32.1 g/dL (ref 30.0–36.0)
MCV: 88.3 fL (ref 78.0–100.0)
MONO ABS: 0.7 10*3/uL (ref 0.1–1.0)
Monocytes Relative: 6 %
Neutro Abs: 9.8 10*3/uL — ABNORMAL HIGH (ref 1.7–7.7)
Neutrophils Relative %: 84 %
PLATELETS: 149 10*3/uL — AB (ref 150–400)
RBC: 3.6 MIL/uL — AB (ref 3.87–5.11)
RDW: 13.7 % (ref 11.5–15.5)
WBC: 11.6 10*3/uL — AB (ref 4.0–10.5)

## 2016-05-11 LAB — COMPREHENSIVE METABOLIC PANEL
ALBUMIN: 3.4 g/dL — AB (ref 3.5–5.0)
ALT: 465 U/L — AB (ref 14–54)
AST: 327 U/L — AB (ref 15–41)
Alkaline Phosphatase: 116 U/L (ref 38–126)
Anion gap: 5 (ref 5–15)
BILIRUBIN TOTAL: 3.4 mg/dL — AB (ref 0.3–1.2)
BUN: 55 mg/dL — AB (ref 6–20)
CHLORIDE: 114 mmol/L — AB (ref 101–111)
CO2: 23 mmol/L (ref 22–32)
CREATININE: 0.7 mg/dL (ref 0.44–1.00)
Calcium: 8.3 mg/dL — ABNORMAL LOW (ref 8.9–10.3)
GFR calc Af Amer: >60 mL/min (ref >60)
GLUCOSE: 107 mg/dL — AB (ref 65–99)
Potassium: 4.6 mmol/L (ref 3.5–5.1)
Sodium: 142 mmol/L (ref 135–145)
TOTAL PROTEIN: 6.4 g/dL — AB (ref 6.5–8.1)

## 2016-05-11 LAB — CBC
HEMATOCRIT: 28.7 % — AB (ref 36.0–46.0)
HEMOGLOBIN: 9.6 g/dL — AB (ref 12.0–15.0)
MCH: 29.8 pg (ref 26.0–34.0)
MCHC: 33.4 g/dL (ref 30.0–36.0)
MCV: 89.1 fL (ref 78.0–100.0)
Platelets: 130 10*3/uL — ABNORMAL LOW (ref 150–400)
RBC: 3.22 MIL/uL — ABNORMAL LOW (ref 3.87–5.11)
RDW: 13.8 % (ref 11.5–15.5)
WBC: 11.7 10*3/uL — ABNORMAL HIGH (ref 4.0–10.5)

## 2016-05-11 LAB — PROTIME-INR
INR: 1.13
PROTHROMBIN TIME: 14.6 s (ref 11.4–15.2)

## 2016-05-11 LAB — TSH: TSH: 1.664 u[IU]/mL (ref 0.350–4.500)

## 2016-05-11 LAB — POC OCCULT BLOOD, ED: Fecal Occult Bld: POSITIVE — AB

## 2016-05-11 MED ORDER — TRAMADOL HCL 50 MG PO TABS
50.0000 mg | ORAL_TABLET | Freq: Four times a day (QID) | ORAL | Status: DC | PRN
Start: 2016-05-11 — End: 2016-05-16
  Administered 2016-05-12 – 2016-05-14 (×5): 50 mg via ORAL
  Filled 2016-05-11 (×5): qty 1

## 2016-05-11 MED ORDER — ONDANSETRON HCL 4 MG/2ML IJ SOLN: 4.0000 mg | Freq: Four times a day (QID) | INTRAMUSCULAR | Status: DC | PRN

## 2016-05-11 MED ORDER — SUCRALFATE 1 GM/10ML PO SUSP
1.0000 g | Freq: Once | ORAL | Status: AC
  Administered 2016-05-11: 1 g via ORAL
  Filled 2016-05-11: qty 10

## 2016-05-11 MED ORDER — PANTOPRAZOLE SODIUM 40 MG IV SOLR
40.0000 mg | Freq: Two times a day (BID) | INTRAVENOUS | Status: DC
  Administered 2016-05-11: 40 mg via INTRAVENOUS
  Filled 2016-05-11: qty 40

## 2016-05-11 MED ORDER — SODIUM CHLORIDE 0.9 % IV SOLN
INTRAVENOUS | Status: DC
  Administered 2016-05-12: 05:00:00 via INTRAVENOUS

## 2016-05-11 MED ORDER — GABAPENTIN 100 MG PO CAPS
200.0000 mg | ORAL_CAPSULE | Freq: Every day | ORAL | Status: DC
  Administered 2016-05-12 – 2016-05-15 (×5): 200 mg via ORAL
  Filled 2016-05-11 (×5): qty 2

## 2016-05-11 MED ORDER — MECLIZINE HCL 25 MG PO TABS
25.0000 mg | ORAL_TABLET | Freq: Every day | ORAL | Status: DC
  Administered 2016-05-12 – 2016-05-16 (×5): 25 mg via ORAL
  Filled 2016-05-11 (×5): qty 1

## 2016-05-11 MED ORDER — PANTOPRAZOLE SODIUM 40 MG IV SOLR
40.0000 mg | Freq: Two times a day (BID) | INTRAVENOUS | Status: DC
  Administered 2016-05-12 – 2016-05-13 (×3): 40 mg via INTRAVENOUS
  Filled 2016-05-11 (×3): qty 40

## 2016-05-11 MED ORDER — ONDANSETRON HCL 4 MG PO TABS: 4.0000 mg | ORAL_TABLET | Freq: Four times a day (QID) | ORAL | Status: DC | PRN

## 2016-05-11 MED ORDER — PANTOPRAZOLE SODIUM 40 MG IV SOLR
80.0000 mg | Freq: Once | INTRAVENOUS | Status: AC
  Administered 2016-05-11: 80 mg via INTRAVENOUS
  Filled 2016-05-11: qty 80

## 2016-05-11 MED ORDER — SODIUM CHLORIDE 0.9 % IV SOLN: INTRAVENOUS | Status: DC

## 2016-05-11 MED ORDER — VITAMIN D3 25 MCG (1000 UNIT) PO TABS
4000.0000 [IU] | ORAL_TABLET | Freq: Every day | ORAL | Status: DC
  Administered 2016-05-12 – 2016-05-15 (×5): 4000 [IU] via ORAL
  Filled 2016-05-11 (×5): qty 4

## 2016-05-11 MED ORDER — SODIUM CHLORIDE 0.9% FLUSH
3.0000 mL | Freq: Two times a day (BID) | INTRAVENOUS | Status: DC
  Administered 2016-05-11 – 2016-05-16 (×6): 3 mL via INTRAVENOUS

## 2016-05-11 MED ORDER — RALOXIFENE HCL 60 MG PO TABS
60.0000 mg | ORAL_TABLET | Freq: Every day | ORAL | Status: DC
  Administered 2016-05-12 – 2016-05-15 (×5): 60 mg via ORAL
  Filled 2016-05-11 (×5): qty 1

## 2016-05-11 NOTE — ED Provider Notes (Signed)
[facility]-EMERGENCY DEPT Provider Note   CSN: [id number] Arrival date & time: 05/11/16  1524     History   Chief Complaint Chief Complaint  Patient presents with  . Hematemesis    HPI Suzanne Kim is a 76 y.o. female.  HPI Pt comes in with cc of coffee ground emesis. Pt reports that she woke up feeling sick to her stomach, and then had emesis x 3 times. At least 2 of her emesis were coffee ground in color. Pt then started feeling weak - and so she called EMS. Pt also had 1 episode of BM, and the stools were dark. PT takes aleve everyday. She has no hx of GI bleed and denies taking blood thinners or aspirin/plavix. Currently, pt has no pain and denies dizziness.  Past Medical History:  Diagnosis Date  . Multiple sclerosis (HCC) 1988    Patient Active Problem List   Diagnosis Date Noted  . GI bleed 05/11/2016  . Muscle spasm of right lower extremity 10/14/2013  . Overactive bladder 10/14/2013  . Hip pain, bilateral 10/14/2013  . Bilateral low back pain without sciatica 10/14/2013  . Secondary progressive multiple sclerosis (HCC) 04/09/2013    Past Surgical History:  Procedure Laterality Date  . BREAST ENHANCEMENT SURGERY  1975  . TONSILLECTOMY    . TOTAL VAGINAL HYSTERECTOMY      OB History    No data available       Home Medications    Prior to Admission medications   Medication Sig Start Date End Date Taking? Authorizing Provider  cholecalciferol (VITAMIN D) 1000 units tablet Take 4,000 Units by mouth at bedtime.    Yes Historical Provider, MD  gabapentin (NEURONTIN) 100 MG capsule Take 200 mg by mouth at bedtime.   Yes Historical Provider, MD  meclizine (ANTIVERT) 25 MG tablet Take 25 mg by mouth daily.    Yes Historical Provider, MD  omeprazole (PRILOSEC) 20 MG capsule TAKE 1 CAPSULE DAILY Patient taking differently: Take 1 capsule by mouth at bedtime 12/30/15  Yes Drema Dallas, DO  raloxifene (EVISTA) 60 MG tablet Take 60 mg by mouth at bedtime.    Yes  Historical Provider, MD    Family History Family History  Problem Relation Age of Onset  . Ataxia Neg Hx   . Chorea Neg Hx   . Dementia Neg Hx   . Mental retardation Neg Hx   . Migraines Neg Hx   . Multiple sclerosis Neg Hx   . Neurofibromatosis Neg Hx   . Neuropathy Neg Hx   . Parkinsonism Neg Hx   . Seizures Neg Hx   . Stroke Neg Hx     Social History Social History  Substance Use Topics  . Smoking status: Never Smoker  . Smokeless tobacco: Never Used  . Alcohol use No     Allergies   Patient has no known allergies.   Review of Systems Review of Systems  Constitutional: Positive for activity change.  Gastrointestinal: Positive for vomiting. Negative for abdominal pain.  All other systems reviewed and are negative.    Physical Exam Updated Vital Signs BP (!) 148/66 (BP Location: Right Arm)   Pulse 90   Temp 99 F (37.2 C) (Oral)   Resp 18   SpO2 98%   Physical Exam  Constitutional: She is oriented to person, place, and time. She appears well-developed and well-nourished.  HENT:  Head: Normocephalic and atraumatic.  Eyes: EOM are normal. Pupils are equal, round, and reactive to light.  Neck: Neck supple.  Cardiovascular: Normal rate, regular rhythm and normal heart sounds.   No murmur heard. Pulmonary/Chest: Effort normal. No respiratory distress.  Abdominal: Soft. She exhibits no distension. There is no tenderness. There is no rebound and no guarding.  Neurological: She is alert and oriented to person, place, and time.  Skin: Skin is warm and dry.  Nursing note and vitals reviewed.    ED Treatments / Results  Labs (all labs ordered are listed, but only abnormal results are displayed) Labs Reviewed  COMPREHENSIVE METABOLIC PANEL - Abnormal; Notable for the following:       Result Value   Chloride 114 (*)    Glucose, Bld 107 (*)    BUN 55 (*)    Calcium 8.3 (*)    Total Protein 6.4 (*)    Albumin 3.4 (*)    AST 327 (*)    ALT 465 (*)     Total Bilirubin 3.4 (*)    All other components within normal limits  CBC WITH DIFFERENTIAL/PLATELET - Abnormal; Notable for the following:    WBC 11.6 (*)    RBC 3.60 (*)    Hemoglobin 10.2 (*)    HCT 31.8 (*)    Platelets 149 (*)    Neutro Abs 9.8 (*)    All other components within normal limits  POC OCCULT BLOOD, ED - Abnormal; Notable for the following:    Fecal Occult Bld POSITIVE (*)    All other components within normal limits  PROTIME-INR  TYPE AND SCREEN  ABO/RH    EKG  EKG Interpretation None       Radiology No results found.  Procedures Procedures (including critical care time)  Medications Ordered in ED Medications  pantoprazole (PROTONIX) 80 mg in sodium chloride 0.9 % 100 mL IVPB (not administered)     Initial Impression / Assessment and Plan / ED Course  I have reviewed the triage vital signs and the nursing notes.  Pertinent labs & imaging results that were available during my care of the patient were reviewed by me and considered in my medical decision making (see chart for details).    Pt comes in with cc of coffee ground emesis. She has uremia, and her LFTs are elevated. We will give protonix x 1 dose. GI has been consulted. Medicine will admit.   Final Clinical Impressions(s) / ED Diagnoses   Final diagnoses:  Acute upper GI bleed  Acute liver failure without hepatic coma    New Prescriptions New Prescriptions   No medications on file     Derwood Kaplan, MD 05/11/16 1746

## 2016-05-11 NOTE — H&P (Addendum)
History and Physical    Suzanne Kim ZOX:[id number] DOB: 1940-04-17 DOA: 05/11/2016  Referring MD/NP/PA: Dr. Rhunette Croft   PCP: Gaspar Garbe, MD   Patient coming from: home  Chief Complaint: emesis,coffee ground   HPI: Suzanne Kim is a 76 y.o. female with known multiple sclerosis, vitamin D deficiency, presented to [facility] emergency department with acute onset of coffee-ground emesis that started earlier in the day prior to this admission. Patient reports she woke up feeling sick to her stomach and had 3 episodes of bloody vomiting, at least 2 of the episodes for coffee ground. Patient reports associated abdominal discomfort in the epigastric area, dizziness and weakness. She reports feeling tired and had to lay down. In addition patient reports one episode of bloody bowel movement, stools very dark. Patient reports she takes Advil daily once to twice per day. Patient denies similar events in the past. Patient also denies chest pain or shortness of breath, no specific urinary concerns.  In emergency department, patient found to be hemodynamically stable, vital signs stable, blood work notable for WBC 11.6, hemoglobin 10.2 (unknown baseline), platelets 149, chloride 114 creatinine 0.7, BUN 55. FOBT positive for stool. LFT panel pending, GI team was consulted by emergency room doctor. Plan to admit to inpatient unit for further evaluation.  Review of Systems:  Constitutional: Negative for fever, chills, diaphoresis, activity change, appetite change and fatigue.  HENT: Negative for ear pain, nosebleeds, congestion, facial swelling, rhinorrhea, neck pain, neck stiffness and ear discharge.   Eyes: Negative for pain, discharge, redness, itching and visual disturbance.  Respiratory: Negative for cough, choking, chest tightness, shortness of breath, wheezing and stridor.   Cardiovascular: Negative for chest pain, palpitations and leg swelling.  Gastrointestinal: Negative for abdominal  distention.  Genitourinary: Negative for dysuria, urgency, frequency, hematuria, flank pain, decreased urine volume, difficulty urinating and dyspareunia.  Musculoskeletal: Negative for back pain, joint swelling, arthralgias and gait problem.  Neurological: Negative for facial asymmetry, speech difficulty Hematological: Negative for adenopathy. Does not bruise/bleed easily.  Psychiatric/Behavioral: Negative for hallucinations, behavioral problems, confusion, dysphoric mood, decreased concentration and agitation.   Past Medical History:  Diagnosis Date  . Multiple sclerosis (HCC) 1988    Past Surgical History:  Procedure Laterality Date  . BREAST ENHANCEMENT SURGERY  1975  . TONSILLECTOMY    . TOTAL VAGINAL HYSTERECTOMY     Social Hx;   reports that she has never smoked. She has never used smokeless tobacco. She reports that she does not drink alcohol or use drugs.  No Known Allergies  Family History  Problem Relation Age of Onset  . Ataxia Neg Hx   . Chorea Neg Hx   . Dementia Neg Hx   . Mental retardation Neg Hx   . Migraines Neg Hx   . Multiple sclerosis Neg Hx   . Neurofibromatosis Neg Hx   . Neuropathy Neg Hx   . Parkinsonism Neg Hx   . Seizures Neg Hx   . Stroke Neg Hx    Medication Sig  cholecalciferol (VITAMIN D) 1000 units tablet Take 4,000 Units by mouth at bedtime.   gabapentin (NEURONTIN) 100 MG capsule Take 200 mg by mouth at bedtime.  meclizine (ANTIVERT) 25 MG tablet Take 25 mg by mouth daily.   omeprazole (PRILOSEC) 20 MG capsule TAKE 1 CAPSULE DAILY Patient taking differently: Take 1 capsule by mouth at bedtime  raloxifene (EVISTA) 60 MG tablet Take 60 mg by mouth at bedtime.    Physical Exam: Vitals:  05/11/16 1541 05/11/16 1543  BP:  (!) 148/66  Pulse:  90  Resp:  18  Temp:  99 F (37.2 C)  TempSrc:  Oral  SpO2: 95% 98%   Constitutional: NAD, calm, comfortable Vitals:   05/11/16 1541 05/11/16 1543  BP:  (!) 148/66  Pulse:  90  Resp:  18    Temp:  99 F (37.2 C)  TempSrc:  Oral  SpO2: 95% 98%   Eyes: PERRL, lids and conjunctivae normal ENMT: Mucous membranes are moist. Posterior pharynx clear of any exudate or lesions.Normal dentition.  Neck: normal, supple, no masses, no thyromegaly Respiratory: clear to auscultation bilaterally, no wheezing, no crackles. Normal respiratory effort. No accessory muscle use.  Cardiovascular: Regular rhythm, tachycardic Abdomen: no tenderness, no masses palpated. No hepatosplenomegaly. Bowel sounds positive.  Musculoskeletal: no clubbing / cyanosis. No joint deformity upper and lower extremities. Good ROM, no contractures. Normal muscle tone.  Skin: no rashes, lesions, ulcers. No induration Neurologic: CN 2-12 grossly intact. Sensation intact, DTR normal. Strength 5/5 in all 4.  Psychiatric: Normal judgment and insight. Alert and oriented x 3. Normal mood.   Labs on Admission: I have personally reviewed following labs and imaging studies  CBC:  Recent Labs Lab 05/11/16 1627  WBC 11.6*  NEUTROABS 9.8*  HGB 10.2*  HCT 31.8*  MCV 88.3  PLT 149*   Basic Metabolic Panel:  Recent Labs Lab 05/11/16 1627  NA 142  K 4.6  CL 114*  CO2 23  GLUCOSE 107*  BUN 55*  CREATININE 0.70  CALCIUM 8.3*   Coagulation Profile:  Recent Labs Lab 05/11/16 1627  INR 1.13   EKG: Pending Assessment/Plan Active Problems:   GI bleed - Unknown etiology but symptomatic with dizziness and weakness  - Possible NSAID use contriobuting but patient is not taking too much so unclear if this is true etiology - Patient appears slightly dehydrated on exam, plan for repeat CBC to follow-up on hemoglobin - Transfuse for hemoglobin less than 8 - Due to tachycardia, plan to admit to inpatient telemetry unit - GI team consulted, will follow-up on recommendations   Tachycardia - Appears to be reactive secondary to acute GI bleed - Supportive care for now with IV fluids and transfusion if needed - Keep  on telemetry unit    Dehydration - From GI bleed, place on IV fluids for now - Allow full liquid diet - Monitor volume status    Multiple sclerosis - At baseline using Neurontin which we'll continue here - No NSAID use for now  DVT prophylaxis: SCDs Code Status: Full Family Communication: Pt and family updated at bedside Disposition Plan: will be going home once medically stable Consults called: Gastroenterology Admission status: Inpatient status  Debbora Presto MD [facility] Pager [phone]  If 7PM-7AM, please contact night-coverage [email or web address] Password [facility]  05/11/2016, 5:48 PM

## 2016-05-11 NOTE — ED Notes (Signed)
Report given to unit. Pt can go upstairs in about 15 minutes

## 2016-05-11 NOTE — ED Notes (Signed)
Family at bedside. Pt transported to floor bed

## 2016-05-11 NOTE — Progress Notes (Signed)
Page: Suzanne Kim [address] is having a lot of back pain. She is barely able to move without screaming out.

## 2016-05-11 NOTE — Progress Notes (Signed)
Preliminary note--patient to be seen tomorrow  We have been asked to see this patient for GI bleeding in the setting of nonsteroidal anti-inflammatory drug exposure.  In the event of destabilization, please call us at [phone]; we could see the patient emergently this evening, and do endoscopy if necessary. Otherwise, I will plan to see her tomorrow morning and do endoscopy later in the morning.  In the meantime, I have ordered a clear liquid diet for tonight, nothing by mouth after midnight, IV Protonix, and a one-time dose of sucralfate.  Florencia Reasons, M.D. Pager [phone] If no answer or after 5 PM call [phone]

## 2016-05-11 NOTE — Progress Notes (Signed)
2000 Pt. Arrived to the floor with family at her bedside.

## 2016-05-11 NOTE — ED Triage Notes (Signed)
Per EMS, pt is from home with complaints of emesis. Pt reported two episodes of blood tinged emesis. Last time pt vomited was at 12:30. EMS reports that pt had coffee ground when in route. Pt also reports having black stool and pain in RUQ 2 days ago. Pt denies nausea and denies pain in abdomen at this time. Pt has a hx MS.

## 2016-05-12 ENCOUNTER — Inpatient Hospital Stay (HOSPITAL_COMMUNITY): Payer: Medicare Other | Admitting: Anesthesiology

## 2016-05-12 ENCOUNTER — Encounter (HOSPITAL_COMMUNITY)
Admission: EM | Disposition: A | Discharge status: Skilled Nursing Facility | Payer: Self-pay | Source: Home / Self Care | Attending: Internal Medicine

## 2016-05-12 ENCOUNTER — Encounter (HOSPITAL_COMMUNITY): Payer: Self-pay | Admitting: *Deleted

## 2016-05-12 DIAGNOSIS — K7200 Acute and subacute hepatic failure without coma: Secondary | ICD-10-CM

## 2016-05-12 HISTORY — PX: ESOPHAGOGASTRODUODENOSCOPY (EGD) WITH PROPOFOL: SHX5813

## 2016-05-12 LAB — CBC WITH DIFFERENTIAL/PLATELET
Basophils Absolute: 0 10*3/uL (ref 0.0–0.1)
Basophils Relative: 0 %
EOS ABS: 0.2 10*3/uL (ref 0.0–0.7)
EOS PCT: 2 %
HCT: 26.7 % — ABNORMAL LOW (ref 36.0–46.0)
HEMOGLOBIN: 8.3 g/dL — AB (ref 12.0–15.0)
LYMPHS ABS: 1.6 10*3/uL (ref 0.7–4.0)
Lymphocytes Relative: 23 %
MCH: 28.7 pg (ref 26.0–34.0)
MCHC: 31.1 g/dL (ref 30.0–36.0)
MCV: 92.4 fL (ref 78.0–100.0)
MONO ABS: 0.6 10*3/uL (ref 0.1–1.0)
MONOS PCT: 9 %
NEUTROS PCT: 66 %
Neutro Abs: 4.7 10*3/uL (ref 1.7–7.7)
Platelets: 124 10*3/uL — ABNORMAL LOW (ref 150–400)
RBC: 2.89 MIL/uL — ABNORMAL LOW (ref 3.87–5.11)
RDW: 14.1 % (ref 11.5–15.5)
WBC: 7.1 10*3/uL (ref 4.0–10.5)

## 2016-05-12 LAB — COMPREHENSIVE METABOLIC PANEL
ALBUMIN: 2.9 g/dL — AB (ref 3.5–5.0)
ALT: 273 U/L — ABNORMAL HIGH (ref 14–54)
ANION GAP: 7 (ref 5–15)
AST: 119 U/L — AB (ref 15–41)
Alkaline Phosphatase: 82 U/L (ref 38–126)
BUN: 40 mg/dL — ABNORMAL HIGH (ref 6–20)
CHLORIDE: 117 mmol/L — AB (ref 101–111)
CO2: 20 mmol/L — AB (ref 22–32)
Calcium: 8.2 mg/dL — ABNORMAL LOW (ref 8.9–10.3)
Creatinine, Ser: 0.82 mg/dL (ref 0.44–1.00)
GFR calc Af Amer: >60 mL/min (ref >60)
GFR calc non Af Amer: >60 mL/min (ref >60)
GLUCOSE: 93 mg/dL (ref 65–99)
POTASSIUM: 3.7 mmol/L (ref 3.5–5.1)
Sodium: 144 mmol/L (ref 135–145)
Total Bilirubin: 1 mg/dL (ref 0.3–1.2)
Total Protein: 5.1 g/dL — ABNORMAL LOW (ref 6.5–8.1)

## 2016-05-12 LAB — CBC
HEMATOCRIT: 24.2 % — AB (ref 36.0–46.0)
HEMOGLOBIN: 7.9 g/dL — AB (ref 12.0–15.0)
MCH: 28.9 pg (ref 26.0–34.0)
MCHC: 32.6 g/dL (ref 30.0–36.0)
MCV: 88.6 fL (ref 78.0–100.0)
Platelets: 112 10*3/uL — ABNORMAL LOW (ref 150–400)
RBC: 2.73 MIL/uL — ABNORMAL LOW (ref 3.87–5.11)
RDW: 14.1 % (ref 11.5–15.5)
WBC: 7.8 10*3/uL (ref 4.0–10.5)

## 2016-05-12 LAB — ABO/RH: ABO/RH(D): A POS

## 2016-05-12 SURGERY — ESOPHAGOGASTRODUODENOSCOPY (EGD) WITH PROPOFOL
Anesthesia: Monitor Anesthesia Care

## 2016-05-12 MED ORDER — LIDOCAINE 2% (20 MG/ML) 5 ML SYRINGE
INTRAMUSCULAR | Status: DC | PRN
  Administered 2016-05-12: 75 mg via INTRAVENOUS

## 2016-05-12 MED ORDER — LIDOCAINE 2% (20 MG/ML) 5 ML SYRINGE
INTRAMUSCULAR | Status: AC
  Filled 2016-05-12: qty 5

## 2016-05-12 MED ORDER — PHENYLEPHRINE 40 MCG/ML (10ML) SYRINGE FOR IV PUSH (FOR BLOOD PRESSURE SUPPORT)
PREFILLED_SYRINGE | INTRAVENOUS | Status: DC | PRN
  Administered 2016-05-12: 80 ug via INTRAVENOUS

## 2016-05-12 MED ORDER — PROPOFOL 10 MG/ML IV BOLUS
INTRAVENOUS | Status: AC
  Filled 2016-05-12: qty 40

## 2016-05-12 MED ORDER — METHOCARBAMOL 1000 MG/10ML IJ SOLN
500.0000 mg | Freq: Three times a day (TID) | INTRAMUSCULAR | Status: DC | PRN
  Administered 2016-05-12 – 2016-05-14 (×3): 500 mg via INTRAVENOUS
  Filled 2016-05-12 (×2): qty 550
  Filled 2016-05-12: qty 5
  Filled 2016-05-12: qty 550

## 2016-05-12 MED ORDER — PROPOFOL 10 MG/ML IV BOLUS
INTRAVENOUS | Status: DC | PRN
  Administered 2016-05-12 (×3): 10 mg via INTRAVENOUS

## 2016-05-12 MED ORDER — PROPOFOL 500 MG/50ML IV EMUL
INTRAVENOUS | Status: DC | PRN
  Administered 2016-05-12: 100 ug/kg/min via INTRAVENOUS

## 2016-05-12 SURGICAL SUPPLY — 15 items

## 2016-05-12 NOTE — Consult Note (Signed)
Labs from prior requested per Buccini, added to chart from [facility] as below.  02/02/2016:  Patient: Suzanne Kim ID: [id number] Note: All result statuses are Final unless otherwise noted.  Tests: (1) COMPLETE METABOLIC (7741)   GLUCOSE                   84 mg/dl                    60-110   BUN                       19 mg/dl                    5-23   CREATININE                0.7 mg/dl                   0.3-1.5  eGFR Non-African American                             81.8  eGFR African American                             99.0   SODIUM                    145 mEq/L                   135-148   POTASSIUM                 4.0 mEq/L                   3.5-5.3   CHLORIDE                  111 mEq/L                   80-111   CO2                       21 mEq/L                    15-35   CALCIUM                   9.3 mg/dL                   7.0-10.5   TOTAL PROTEIN             6.8 g/dL                    6.0-8.5   ALBUMIN                   3.5 g/dL                    2.0-5.5   AST                       16 IU/L                     7-45   ALT  12 IU/L                     5-40   ALK PHOS                  74 IU/L                     37-137   TOTAL BILIRUBIN           0.3 mg/dl                   0.1-1.5  Tests: (2) CBC (2000)   WBC                       7.61 K/uL                   4.10-10.90   LYM                       1.6 K/uL                    0.6-4.1 ! EOS                       0.2 ! MID                       0.6 K/uL                    0.0-1.8 ! BASO                      0.1   GRAN                      5.1 K/uL                    2.0-7.8   LYM%                      21.0 %                      10.0-58.5 ! EOS%                      3.0 ! MID%                      8.4 %                       0.1-24.0 ! BASO%                     1.0   GRAN%                     66.6                        37.0-92.0   RBC                       4.7 M/uL                     4.2-6.3   HGB  13.0 g/dL                   12.0-18.0   HCT                       44.1 %                      37.0-51.0   MCV                       94.9 fL                     80.0-97.0   MCH                       27.9 pg                     26.0-32.0   MCHC                 [L]  29.4 g/dL                   31.0-36.0   PLT                  [L]  137 K/uL                    140-440  Tests: (3) VITAMIN D (3125)   VITAMIN D                 34.1 ng/mL                  30.0-100.0  Note: An exclamation mark (!) indicates a result that was not dispersed into the flowsheet. Document Creation Date: 02/03/2016 10:43 AM _______________________________________________________________________

## 2016-05-12 NOTE — Progress Notes (Signed)
Patient ID: Suzanne Kim, female   DOB: 1940/06/28, 76 y.o.   MRN: [id number]    PROGRESS NOTE    MONIYA MEDITZ  QBH:[id number] DOB: Jul 31, 1940 DOA: 05/11/2016  PCP: Gaspar Garbe, MD   Brief Narrative:   76 y.o. female with known multiple sclerosis, vitamin D deficiency, presented to [facility] emergency department with acute onset of coffee-ground emesis that started earlier in the day prior to this admission. Patient reports she woke up feeling sick to her stomach and had 3 episodes of bloody vomiting, at least 2 of the episodes for coffee ground. Patient reports associated abdominal discomfort in the epigastric area, dizziness and weakness. She reports feeling tired and had to lay down. In addition patient reports one episode of bloody bowel movement, stools very dark. Patient reports she takes Advil daily once to twice per day. Patient denies similar events in the past. Patient also denies chest pain or shortness of breath, no specific urinary concerns.  In emergency department, patient found to be hemodynamically stable, vital signs stable, blood work notable for WBC 11.6, hemoglobin 10.2 (unknown baseline), platelets 149, chloride 114 creatinine 0.7, BUN 55. FOBT positive for stool. LFT panel pending, GI team was consulted by emergency room doctor. Plan to admit to inpatient unit for further evaluation.  Assessment & Plan:   Active Problems:   GI bleed, acute blood loss anemia, thrombocytopenia  - Unknown etiology but symptomatic with dizziness and weakness  - Possible NSAID use contriobuting  - plan for EGD today, appreciate GI team assistance - repeat CBC this afternoon and if still trending down, plan to transfuse    Tachycardia - Appears to be reactive secondary to acute GI bleed - resolved    Transaminitis - unclear etiology, no suggestion of liver disease per [place] - so far LFT's trending down - repeat LFT's in AM    Leukocytosis - reactive form acute GI  bleeding  - resolved     Dehydration - From GI bleed, placed on IV fluids for now - plan to advance diet after EGD    Multiple sclerosis - At baseline using Neurontin which we'll continue here - No NSAID use for now - more spasms this AM, plan to add robaxin as needed   DVT prophylaxis: SCD's Code Status: Full Family Communication: Patient at bedside  Disposition Plan: to be determined   Consultants:   GI  Procedures:   EGD planned for later today   Antimicrobials:   None   Subjective: No events overnight, pt reports spasms in LE's, thinks it is due to MS  Objective: Vitals:   05/11/16 1541 05/11/16 1543 05/11/16 2035 05/12/16 0509  BP:  (!) 148/66 (!) 111/56 (!) 114/54  Pulse:  90 89 79  Resp:  18 18 18   Temp:  99 F (37.2 C) 98.7 F (37.1 C) 98.4 F (36.9 C)  TempSrc:  Oral Oral Oral  SpO2: 95% 98% 100% 97%  Weight:   52.2 kg (115 lb)   Height:   5\' 2"  (1.575 m)     Intake/Output Summary (Last 24 hours) at 05/12/16 1044 Last data filed at 05/12/16 0507  Gross per 24 hour  Intake            477.5 ml  Output                0 ml  Net            477.5 ml   [employer]  05/11/16 2035  Weight: 52.2 kg (115 lb)    Examination:  General exam: Appears calm and comfortable  Respiratory system: Clear to auscultation. Respiratory effort normal. Cardiovascular system: S1 & S2 heard, RRR. No JVD, murmurs, rubs, gallops or clicks. No pedal edema. Gastrointestinal system: Abdomen is nondistended, soft and nontender. No organomegaly or masses felt.  Central nervous system: Alert and oriented. No focal neurological deficits. Extremities: Symmetric 5 x 5 power. Skin: No rashes, lesions or ulcers Psychiatry: Judgement and insight appear normal. Mood & affect appropriate.   Data Reviewed: I have personally reviewed following labs and imaging studies  CBC:  Recent Labs Lab 05/11/16 1627 05/11/16 2258 05/12/16 0726  WBC 11.6* 11.7* 7.8  NEUTROABS 9.8*   --   --   HGB 10.2* 9.6* 7.9*  HCT 31.8* 28.7* 24.2*  MCV 88.3 89.1 88.6  PLT 149* 130* 112*   Basic Metabolic Panel:  Recent Labs Lab 05/11/16 1627 05/12/16 0726  NA 142 144  K 4.6 3.7  CL 114* 117*  CO2 23 20*  GLUCOSE 107* 93  BUN 55* 40*  CREATININE 0.70 0.82  CALCIUM 8.3* 8.2*   Liver Function Tests:  Recent Labs Lab 05/11/16 1627 05/12/16 0726  AST 327* 119*  ALT 465* 273*  ALKPHOS 116 82  BILITOT 3.4* 1.0  PROT 6.4* 5.1*  ALBUMIN 3.4* 2.9*   Coagulation Profile:  Recent Labs Lab 05/11/16 1627  INR 1.13    Recent Labs  05/11/16 2258  TSH 1.664   Radiology Studies: US Abdomen Limited  Result Date: 05/11/2016 CLINICAL DATA:  Acute right upper quadrant pain EXAM: US ABDOMEN LIMITED - RIGHT UPPER QUADRANT COMPARISON:  None. FINDINGS: Gallbladder: Mild gallbladder distention. No visible stones or wall thickening. Negative sonographic Murphy's. Common bile duct: Diameter: Mildly dilated, measuring 7 mm proximally and 9 mm distally. No visible stones. Liver: No focal lesion identified. Within normal limits in parenchymal echogenicity. IMPRESSION: Mild common bile duct dilatation measuring up to 9 mm. Recommend correlation with LFTs. If further evaluation is felt warranted to exclude distal obstructing stone or mass, MRCP may be beneficial. Mildly distended gallbladder without visible stones, wall thickening or sonographic Murphy sign. Electronically Signed   By: Charlett Nose M.D.   On: 05/11/2016 18:18    Scheduled Meds: . [MAR Hold] cholecalciferol  4,000 Units Oral QHS  . [MAR Hold] gabapentin  200 mg Oral QHS  . [MAR Hold] meclizine  25 mg Oral Daily  . [MAR Hold] pantoprazole (PROTONIX) IV  40 mg Intravenous Q12H  . [MAR Hold] raloxifene  60 mg Oral QHS  . [MAR Hold] sodium chloride flush  3 mL Intravenous Q12H   Continuous Infusions: . sodium chloride 75 mL/hr at 05/12/16 0507  . sodium chloride    . [MAR Hold] methocarbamol (ROBAXIN)  IV        LOS: 1 day   Time spent: 20 minutes   Debbora Presto, MD [facility] Pager [phone]  If 7PM-7AM, please contact night-coverage [email or web address] Password [facility] 05/12/2016, 10:44 AM

## 2016-05-12 NOTE — Progress Notes (Signed)
Patient's endoscopy showed a large hiatal hernia with Sheria Lang lesions that were somewhat hemorrhagic, although not actively bleeding. No blood or coffee-ground material was noted in the stomach.  Overnight, the patient has had a significant drop in hemoglobin, presumably the result of equilibration. It is noted that her BUN is still significantly elevated, although improved from yesterday. We have received blood test results and the patient's primary physician, which show a normal hemoglobin of 13.0 in January of this year.  Meanwhile, her liver chemistries are coming down nicely. It is not clear whether the patient possibly passed a bile duct stone (no gallbladder stones seen on ultrasound), or whether this is a reactive hepatopathy (? Mild "shock liver" from blood loss?). Blood work from the patient's primary physician which shows that she had completely normal liver chemistries in January of this year.  The patient also had normal platelets last July, and a borderline low platelet count of 137,000 in January. Thus, the current low platelet count could be the result of consumption from her GI bleed, but it could also be reflective of a trend toward thrombocytopenia (doubt portal hypertension,? Bone marrow issue).  Recommendations:  1. Continue in-hospital observation until hemoglobin has been stable on 2 consecutive readings and BUN shows a definite trend toward improvement; hopefully, the patient can go home tomorrow, because she is concerned that she is getting weaker in the hospital.  2. Continue to monitor liver chemistries. If this is "shock liver," they should continue to improve spontaneously day by day.  3. Consider physical therapy consult because of patient's limited mobility and concerns of progressive weakness.  4. Bleeding from [facility] lesions, sort of like diverticular hemorrhage, typically occurs sporadically and resolves spontaneously. To help minimize bleeding, especially with her  low platelet count, I would avoid nonsteroidal anti-inflammatory drugs which have an antiplatelet effect. I would also continue the patient on PPI therapy since her large hiatal hernia puts her at risk for reflux, and it might help promote healing of the [facility] erosions, even though they are not primarily a peptic process, but rather, due to mechanical trauma of the gastric mucosa riding repeatedly over the diaphragm when the patient breathes. The ultimate treatment would be surgical repair of the patient's hiatal hernia, which could be considered if the patient has recurrent bleeding episodes in the future.  Florencia Reasons, M.D. Pager [phone] If no answer or after 5 PM call [phone]

## 2016-05-12 NOTE — Transfer of Care (Signed)
Immediate Anesthesia Transfer of Care Note  Patient: Suzanne Kim  Procedure(s) Performed: Procedure(s): ESOPHAGOGASTRODUODENOSCOPY (EGD) WITH PROPOFOL (N/A)  Patient Location: Endoscopy Unit  Anesthesia Type:MAC  Level of Consciousness: awake, alert  and oriented  Airway & Oxygen Therapy: Patient Spontanous Breathing and Patient connected to nasal cannula oxygen  Post-op Assessment: Report given to RN and Post -op Vital signs reviewed and stable  Post vital signs: Reviewed and stable  Last Vitals:  Vitals:   05/12/16 0509 05/12/16 1044  BP: (!) 114/54 (!) 110/28  Pulse: 79 80  Resp: 18 10  Temp: 36.9 C 36.7 C    Last Pain:  Vitals:   05/12/16 1044  TempSrc: Oral  PainSc:          Complications: No apparent anesthesia complications

## 2016-05-12 NOTE — Op Note (Signed)
[facility] Patient Name: Suzanne Kim Procedure Date: 05/12/2016 MRN: [id number] Attending MD: Bernette Redbird , MD Date of Birth: 1940/03/15 CSN: [id number] Age: 76 Admit Type: Inpatient Procedure:                Upper GI endoscopy Indications:              Acute post hemorrhagic anemia, Coffee-ground emesis                            in an MS patient on Aleve x 4 mos; also, low plts                            and elev LFT's Providers:                Bernette Redbird, MD, Janae Sauce. Steele Berg, RN, Zoila Shutter, Technician, Beryle Beams, Technician Referring MD:              Medicines:                Monitored Anesthesia Care Complications:            No immediate complications. Estimated Blood Loss:     Estimated blood loss was minimal. Procedure:                Pre-Anesthesia Assessment:                           - Prior to the procedure, a History and Physical                            was performed, and patient medications and                            allergies were reviewed. The patient's tolerance of                            previous anesthesia was also reviewed. The risks                            and benefits of the procedure and the sedation                            options and risks were discussed with the patient.                            All questions were answered, and informed consent                            was obtained. Prior Anticoagulants: The patient has                            taken previous NSAID medication, last dose was 1  day prior to procedure. ASA Grade Assessment: III -                            A patient with severe systemic disease. After                            reviewing the risks and benefits, the patient was                            deemed in satisfactory condition to undergo the                            procedure.                           After obtaining informed  consent, the endoscope was                            passed under direct vision. Throughout the                            procedure, the patient's blood pressure, pulse, and                            oxygen saturations were monitored continuously. The                            EG-[id number] ([id number]) scope was introduced through the                            mouth, and advanced to the second part of duodenum.                            The upper GI endoscopy was accomplished without                            difficulty. The patient tolerated the procedure                            well. Scope In: Scope Out: Findings:      A 4-5 cm hiatal hernia was present.      The exam of the esophagus was otherwise normal.      There is no endoscopic evidence of esophagitis, varices or Mallory-Weiss       tear in the entire esophagus.      The stomach contained a small amount of bile, but no blood or coffee       grounds.      Localized moderately friable mucosa with spontaneous mucosal bleeding       was found in the gastric fundus at the diaphragmatic hiatus, which was       very patulous.      No other significant abnormalities were identified in a careful       examination of the stomach.      Specifically, there is no endoscopic evidence of erythema or  inflammatory changes suggestive of NSAID-induced gastritis in the       gastric antrum.      The cardia and gastric fundus were normal on retroflexion apart from the       above-mentioned findings.      The examined duodenum was normal. Impression:               - 5 cm hiatal hernia.                           - Friable gastric mucosa.                           - Normal examined duodenum.                           - No specimens collected. Moderate Sedation:      This patient was sedated with monitored anesthesia care, not moderate       sedation. Recommendation:           - Continue present medications.                           -  Avoid ibuprofen, naproxen, or other non-steroidal                            anti-inflammatory drugs.                           - Resume regular diet. Procedure Code(s):        --- Professional ---                           [phone], Esophagogastroduodenoscopy, flexible,                            transoral; diagnostic, including collection of                            specimen(s) by brushing or washing, when performed                            (separate procedure) Diagnosis Code(s):        --- Professional ---                           K31.89, Other diseases of stomach and duodenum                           D62, Acute posthemorrhagic anemia                           K92.0, Hematemesis CPT copyright 2016 American [employer]. All rights reserved. The codes documented in this report are preliminary and upon coder review may  be revised to meet current compliance requirements. Bernette Redbird, MD 05/12/2016 12:13:11 PM This report has been signed electronically. Number of Addenda: 0

## 2016-05-12 NOTE — Anesthesia Preprocedure Evaluation (Signed)
Anesthesia Evaluation  Patient identified by MRN, date of birth, ID band Patient awake    Reviewed: Allergy & Precautions, NPO status , Patient's Chart, lab work & pertinent test results  Airway Mallampati: II  TM Distance: >3 FB Neck ROM: Limited    Dental no notable dental hx.    Pulmonary neg pulmonary ROS,    Pulmonary exam normal breath sounds clear to auscultation       Cardiovascular negative cardio ROS Normal cardiovascular exam Rhythm:Regular Rate:Normal     Neuro/Psych MS  Neuromuscular disease negative psych ROS   GI/Hepatic negative GI ROS, Neg liver ROS,   Endo/Other  negative endocrine ROS  Renal/GU negative Renal ROS  negative genitourinary   Musculoskeletal negative musculoskeletal ROS (+)   Abdominal   Peds negative pediatric ROS (+)  Hematology  (+) anemia ,   Anesthesia Other Findings   Reproductive/Obstetrics negative OB ROS                             Anesthesia Physical Anesthesia Plan  ASA: III  Anesthesia Plan: MAC   Post-op Pain Management:    Induction: Intravenous  Airway Management Planned: Nasal Cannula  Additional Equipment:   Intra-op Plan:   Post-operative Plan:   Informed Consent: I have reviewed the patients History and Physical, chart, labs and discussed the procedure including the risks, benefits and alternatives for the proposed anesthesia with the patient or authorized representative who has indicated his/her understanding and acceptance.   Dental advisory given  Plan Discussed with: CRNA and Surgeon  Anesthesia Plan Comments:         Anesthesia Quick Evaluation

## 2016-05-12 NOTE — Anesthesia Postprocedure Evaluation (Addendum)
Anesthesia Post Note  Patient: Suzanne Kim  Procedure(s) Performed: Procedure(s) (LRB): ESOPHAGOGASTRODUODENOSCOPY (EGD) WITH PROPOFOL (N/A)  Patient location during evaluation: PACU Anesthesia Type: MAC Level of consciousness: awake and alert Pain management: pain level controlled Vital Signs Assessment: post-procedure vital signs reviewed and stable Respiratory status: spontaneous breathing, nonlabored ventilation, respiratory function stable and patient connected to nasal cannula oxygen Cardiovascular status: stable and blood pressure returned to baseline Anesthetic complications: no       Last Vitals:  Vitals:   05/12/16 1044 05/12/16 1200  BP: (!) 110/28 109/81  Pulse: 80 73  Resp: 10 19  Temp: 36.7 C 36.7 C    Last Pain:  Vitals:   05/12/16 1200  TempSrc: Oral  PainSc:                  Cynai Skeens S

## 2016-05-12 NOTE — Consult Note (Signed)
Referring Provider:   Dr. Danie Binder Primary Care Physician:  Gaspar Garbe, MD Primary Gastroenterologist:  None (unassigned)  Reason for Consultation:  Coffee-ground emesis  HPI: Suzanne Kim is a 76 y.o. female admitted to the hospital yesterday with several episodes of coffee-ground emesis, on daily Aleve, with dark heme positive stool, and hemoglobin of 10.2 (baseline hemoglobin not immediately available). No prior history of ulcer disease, no prior history of GI bleeding, no prodromal dyspeptic symptomatology.  Of note, the patient is on omeprazole 20 mg daily at home.  The patient has had MS most of her life, and as such, has severe limitation of mobility, able to take just a few steps at her home, using wheelchair to get around the house otherwise, yet still living semi-independently (neighbors bring over food, which she then puts in the microwave).  Has irregularity of bowel habit which she is able to manage with over-the-counter medications, but no recent melenic stool.  Overnight, the patient's hemoglobin has stayed essentially stable, dropping from 10.2 to a current level of 9.6. Her BUN was 55 on admission yesterday but was not obtained this morning.  Another issue is that the patient has elevated liver chemistries. I will attempt to find out if this is new or old, but on admission, transaminases were in the 300-400 range, and have dropped roughly 50% overnight, including total bilirubin dropping from 3.4-1.0. On ultrasound, no gallstones were seen, that the patient's bile duct was borderline at 7 mm.   Past Medical History:  Diagnosis Date  . Multiple sclerosis (HCC) 1988    Past Surgical History:  Procedure Laterality Date  . BREAST ENHANCEMENT SURGERY  1975  . TONSILLECTOMY    . TOTAL VAGINAL HYSTERECTOMY      Prior to Admission medications   Medication Sig Start Date End Date Taking? Authorizing Provider  cholecalciferol (VITAMIN D) 1000 units tablet Take  4,000 Units by mouth at bedtime.    Yes Historical Provider, MD  gabapentin (NEURONTIN) 100 MG capsule Take 200 mg by mouth at bedtime.   Yes Historical Provider, MD  meclizine (ANTIVERT) 25 MG tablet Take 25 mg by mouth daily.    Yes Historical Provider, MD  omeprazole (PRILOSEC) 20 MG capsule TAKE 1 CAPSULE DAILY Patient taking differently: Take 1 capsule by mouth at bedtime 12/30/15  Yes Drema Dallas, DO  raloxifene (EVISTA) 60 MG tablet Take 60 mg by mouth at bedtime.    Yes Historical Provider, MD    Current Facility-Administered Medications  Medication Dose Route Frequency Provider Last Rate Last Dose  . 0.9 %  sodium chloride infusion   Intravenous Continuous Dorothea Ogle, MD 75 mL/hr at 05/12/16 0507    . 0.9 %  sodium chloride infusion   Intravenous Continuous Bernette Redbird, MD      . cholecalciferol (VITAMIN D) tablet 4,000 Units  4,000 Units Oral QHS Dorothea Ogle, MD   4,000 Units at 05/12/16 0023  . gabapentin (NEURONTIN) capsule 200 mg  200 mg Oral QHS Dorothea Ogle, MD   200 mg at 05/12/16 0022  . meclizine (ANTIVERT) tablet 25 mg  25 mg Oral Daily Dorothea Ogle, MD      . methocarbamol (ROBAXIN) 500 mg in dextrose 5 % 50 mL IVPB  500 mg Intravenous Q8H PRN Dorothea Ogle, MD      . ondansetron [facility]) tablet 4 mg  4 mg Oral Q6H PRN Dorothea Ogle, MD       Or  .  ondansetron (ZOFRAN) injection 4 mg  4 mg Intravenous Q6H PRN Dorothea Ogle, MD      . pantoprazole (PROTONIX) injection 40 mg  40 mg Intravenous Q12H Dorothea Ogle, MD      . raloxifene (EVISTA) tablet 60 mg  60 mg Oral QHS Dorothea Ogle, MD   60 mg at 05/12/16 0031  . sodium chloride flush (NS) 0.9 % injection 3 mL  3 mL Intravenous Q12H Dorothea Ogle, MD   3 mL at 05/11/16 2245  . traMADol (ULTRAM) tablet 50 mg  50 mg Oral Q6H PRN Leda Gauze, NP   50 mg at 05/12/16 0023    Allergies as of 05/11/2016  . (No Known Allergies)    Family History  Problem Relation Age of Onset  . Ataxia Neg Hx   .  Chorea Neg Hx   . Dementia Neg Hx   . Mental retardation Neg Hx   . Migraines Neg Hx   . Multiple sclerosis Neg Hx   . Neurofibromatosis Neg Hx   . Neuropathy Neg Hx   . Parkinsonism Neg Hx   . Seizures Neg Hx   . Stroke Neg Hx     Social History   Social History  . Marital status: Married    Spouse name: N/A  . Number of children: N/A  . Years of education: N/A   Occupational History  . Not on file.   Social History Main Topics  . Smoking status: Never Smoker  . Smokeless tobacco: Never Used  . Alcohol use No  . Drug use: No  . Sexual activity: No   Other Topics Concern  . Not on file   Social History Narrative  . No narrative on file    Review of Systems: No heartburn, no cardiopulmonary symptoms, minimal lower extremity swelling.  Physical Exam: Vital signs in last 24 hours: Temp:  [98.4 F (36.9 C)-99 F (37.2 C)] 98.4 F (36.9 C) (04/26 0509) Pulse Rate:  [79-90] 79 (04/26 0509) Resp:  [18] 18 (04/26 0509) BP: (111-148)/(54-66) 114/54 (04/26 0509) SpO2:  [95 %-100 %] 97 % (04/26 0509) Weight:  [52.2 kg (115 lb)] 52.2 kg (115 lb) (04/25 2035) Last BM Date: 05/11/16 General:   Alert,  very articulate, Well-developed, well-nourished, pleasant and cooperative in NAD Head:  Normocephalic and atraumatic. Eyes:  Sclera clear, no icterus.   Conjunctiva pink. Mouth:   No ulcerations or lesions.  Oropharynx pink & moist. Neck:   No masses or thyromegaly. Lungs:  Clear throughout to auscultation.   No wheezes, crackles, or rhonchi. No evident respiratory distress. Heart:   Regular rate and rhythm; no murmurs, clicks, rubs,  or gallops. Abdomen:  Soft, nontender, nontympanitic, and nondistended. No masses, hepatosplenomegaly or ventral hernias noted.  Rectal:  Performed yesterday by ER physician, showed dark heme-positive stool   Msk:  Some hand deformity suggestive of rheumatoid arthritis type deformity. Pulses:  Normal radial pulse is noted. Extremities:    Without clubbing, cyanosis, or edema. Neurologic:  Alert and coherent;  grossly normal neurologically. Skin:  Intact without significant lesions or rashes. Cervical Nodes:  No significant cervical adenopathy. Psych:   Alert and cooperative. Normal mood and affect.  Intake/Output from previous day: 04/25 0701 - 04/26 0700 In: 477.5 [I.V.:477.5] Out: -  Intake/Output this shift: No intake/output data recorded.  Lab Results:  Recent Labs  05/11/16 1627 05/11/16 2258 05/12/16 0726  WBC 11.6* 11.7* 7.8  HGB 10.2* 9.6* 7.9*  HCT 31.8* 28.7*  24.2*  PLT 149* 130* 112*   BMET  Recent Labs  05/11/16 1627 05/12/16 0726  NA 142 144  K 4.6 3.7  CL 114* 117*  CO2 23 20*  GLUCOSE 107* 93  BUN 55* 40*  CREATININE 0.70 0.82  CALCIUM 8.3* 8.2*   LFT  Recent Labs  05/12/16 0726  PROT 5.1*  ALBUMIN 2.9*  AST 119*  ALT 273*  ALKPHOS 82  BILITOT 1.0   PT/INR  Recent Labs  05/11/16 1627  LABPROT 14.6  INR 1.13    Studies/Results: US Abdomen Limited  Result Date: 05/11/2016 CLINICAL DATA:  Acute right upper quadrant pain EXAM: US ABDOMEN LIMITED - RIGHT UPPER QUADRANT COMPARISON:  None. FINDINGS: Gallbladder: Mild gallbladder distention. No visible stones or wall thickening. Negative sonographic Murphy's. Common bile duct: Diameter: Mildly dilated, measuring 7 mm proximally and 9 mm distally. No visible stones. Liver: No focal lesion identified. Within normal limits in parenchymal echogenicity. IMPRESSION: Mild common bile duct dilatation measuring up to 9 mm. Recommend correlation with LFTs. If further evaluation is felt warranted to exclude distal obstructing stone or mass, MRCP may be beneficial. Mildly distended gallbladder without visible stones, wall thickening or sonographic Murphy sign. Electronically Signed   By: Charlett Nose M.D.   On: 05/11/2016 18:18    Impression: 1. Acute upper GI bleed characterized by coffee-ground emesis in a patient on nonsteroidal  anti-inflammatory drugs, but interestingly also covered by PPI therapy which would make ulcer disease somewhat less likely.   2. Patien elevated liver chemistries; in addition, the patient has mild thrombocytopenia, raising at least the question of chronic liver disease although the patient's limited ultrasound did not show evident cirrhosis  Plan: Endoscopic evaluation later today. Ashby Dawes purpose risks and alternatives discussed and patient agreeable. Obtain prior liver chemistries and baseline hemoglobin from primary physician.    LOS: 1 day   Hadyn Blanck V  05/12/2016, 9:16 AM   Pager [phone] If no answer or after 5 PM call [phone]

## 2016-05-13 LAB — COMPREHENSIVE METABOLIC PANEL
ALK PHOS: 69 U/L (ref 38–126)
ALT: 181 U/L — AB (ref 14–54)
AST: 56 U/L — AB (ref 15–41)
Albumin: 2.8 g/dL — ABNORMAL LOW (ref 3.5–5.0)
Anion gap: 6 (ref 5–15)
BUN: 23 mg/dL — AB (ref 6–20)
CALCIUM: 8.4 mg/dL — AB (ref 8.9–10.3)
CHLORIDE: 116 mmol/L — AB (ref 101–111)
CO2: 23 mmol/L (ref 22–32)
CREATININE: 0.82 mg/dL (ref 0.44–1.00)
Glucose, Bld: 98 mg/dL (ref 65–99)
Potassium: 3.7 mmol/L (ref 3.5–5.1)
SODIUM: 145 mmol/L (ref 135–145)
Total Bilirubin: 0.6 mg/dL (ref 0.3–1.2)
Total Protein: 5 g/dL — ABNORMAL LOW (ref 6.5–8.1)

## 2016-05-13 LAB — CBC
HCT: 24.2 % — ABNORMAL LOW (ref 36.0–46.0)
HEMOGLOBIN: 7.9 g/dL — AB (ref 12.0–15.0)
MCH: 29.7 pg (ref 26.0–34.0)
MCHC: 32.6 g/dL (ref 30.0–36.0)
MCV: 91 fL (ref 78.0–100.0)
Platelets: 104 10*3/uL — ABNORMAL LOW (ref 150–400)
RBC: 2.66 MIL/uL — AB (ref 3.87–5.11)
RDW: 14.1 % (ref 11.5–15.5)
WBC: 6.6 10*3/uL (ref 4.0–10.5)

## 2016-05-13 MED ORDER — PANTOPRAZOLE SODIUM 40 MG PO TBEC
40.0000 mg | DELAYED_RELEASE_TABLET | Freq: Every day | ORAL | Status: DC
  Administered 2016-05-14 – 2016-05-16 (×3): 40 mg via ORAL
  Filled 2016-05-13 (×3): qty 1

## 2016-05-13 MED ORDER — SODIUM CHLORIDE 0.9 % IV SOLN: Freq: Once | INTRAVENOUS | Status: DC

## 2016-05-13 NOTE — Progress Notes (Signed)
Hemoglobin stable. Liver chemistries continue to improve.  Endoscopic findings from yesterday reviewed with the patient.  Patient's chief concern continues to be her mobility, so that she can return to her home and live independently.  Recommendations:  1. Will sign off; please call us back if questions or if we can be of further assistance.  2. Have converted patient's PPI to oral, and reduced to once-daily dosing, which I think should be adequate coverage going forward.  3.  Patient should f/u w/ her PCP in 1-2 wks following dischg to monitor hemoccult status, and update Hgb and LFT's.    However, f/u w/ GI is not necessary.  4. Please see my progress note from yesterday for further recommendations.  Florencia Reasons, M.D. Pager [phone] If no answer or after 5 PM call [phone]

## 2016-05-13 NOTE — Evaluation (Signed)
Physical Therapy Evaluation Patient Details Name: Suzanne Kim MRN: [id number] DOB: 01-09-1941 Today's Date: 05/13/2016   History of Present Illness  76 y.o. female with known multiple sclerosis, vitamin D deficiency and admitted for GI bleed.  Clinical Impression  Pt admitted with above diagnosis. Pt currently with functional limitations due to the deficits listed below (see PT Problem List).  Pt will benefit from skilled PT to increase their independence and safety with mobility to allow discharge to the venue listed below.  Pt reports she sleeps in a lift chair and typically transfers to w/c with RW.  Pt mobilizes with w/c around home.  Pt states her neighbor assists her with getting ready for "bed" and leaves breakfast ready for the morning; also drives pt to appointments.  Pt very weak and unsteady today and was unable to transfer to recliner (required stedy).  Discussed d/c to SNF and pt reports concern for needing to sleep in lift chair or similar. Pt has been sleeping in hospital bed and states this has been hurting her back.  Recommend ST-SNF upon d/c, if this is not possible, pt likely needs increased home care, aide and HHPT.       Follow Up Recommendations SNF;Supervision/Assistance - 24 hour    Equipment Recommendations  3in1 (PT)    Recommendations for Other Services       Precautions / Restrictions Precautions Precautions: Fall Precaution Comments: wears depends at home, sleeps in lift chair      Mobility  Bed Mobility Overal bed mobility: Needs Assistance Bed Mobility: Supine to Sit     Supine to sit: Max assist;HOB elevated     General bed mobility comments: increased assist due to pt not typically sleeping in bed, sleeps in lift chair  Transfers Overall transfer level: Needs assistance Equipment used: Rolling walker (2 wheeled) Transfers: Sit to/from [employer] Sit to Stand: Mod assist Stand pivot transfers: +2 safety/equipment (with  stedy, NT assisted)       General transfer comment: assist to rise, steady and control descent, pt unable to pivot to recliner despite attempts x2, required stedy to assist pt to recliner  Ambulation/Gait                Stairs            Wheelchair Mobility    Modified Rankin (Stroke Patients Only)       Balance Overall balance assessment: Needs assistance         Standing balance support: Bilateral upper extremity supported;During functional activity Standing balance-Leahy Scale: Zero Standing balance comment: pt requires UE support and external assist today                             Pertinent Vitals/Pain Pain Assessment: Faces Faces Pain Scale: Hurts a little bit Pain Location: chronic back pain Pain Descriptors / Indicators: Aching;Sore Pain Intervention(s): Repositioned (states being in recliner will help back pain)    Home Living Family/patient expects to be discharged to:: Private residence Living Arrangements: Alone Available Help at Discharge: Neighbor Type of Home: House       Home Layout: One level Home Equipment: Environmental consultant - 2 wheels;Wheelchair - manual      Prior Function Level of Independence: Needs assistance   Gait / Transfers Assistance Needed: pt able to transfer from lift chair, take a couple steps with RW and use w/c for mobility around home  ADL's / Homemaking Assistance Needed: pt's  neighbor settles pt in for the night, sets up breakfast, drives pt        Hand Dominance        Extremity/Trunk Assessment        Lower Extremity Assessment Lower Extremity Assessment: Generalized weakness;RLE deficits/detail;LLE deficits/detail (grossly 2+/5 throughout which pt reports as close to baseline strength) RLE Deficits / Details: R foot drop observed    Cervical / Trunk Assessment Cervical / Trunk Assessment: Kyphotic;Other exceptions Cervical / Trunk Exceptions: severe forward head and trunk flexion posture  sitting and mobilizing  Communication   Communication: No difficulties  Cognition Arousal/Alertness: Awake/alert Behavior During Therapy: WFL for tasks assessed/performed Overall Cognitive Status: Within Functional Limits for tasks assessed                                        General Comments      Exercises     Assessment/Plan    PT Assessment Patient needs continued PT services  PT Problem List Decreased mobility;Decreased strength;Decreased activity tolerance;Decreased knowledge of use of DME;Decreased balance       PT Treatment Interventions DME instruction;Gait training;Therapeutic activities;Therapeutic exercise;Functional mobility training;Patient/family education;Balance training;Wheelchair mobility training    PT Goals (Current goals can be found in the Care Plan section)  Acute Rehab PT Goals PT Goal Formulation: With patient Time For Goal Achievement: 05/27/16 Potential to Achieve Goals: Good    Frequency Min 3X/week   Barriers to discharge        Co-evaluation               End of Session Equipment Utilized During Treatment: Gait belt Activity Tolerance: Patient tolerated treatment well Patient left: with call bell/phone within reach;in chair Nurse Communication: Mobility status;Need for lift equipment PT Visit Diagnosis: Unsteadiness on feet (R26.81);Muscle weakness (generalized) (M62.81)    Time: 9702-6378 PT Time Calculation (min) (ACUTE ONLY): 32 min   Charges:   PT Evaluation $PT Eval Moderate Complexity: 1 Procedure PT Treatments $Therapeutic Activity: 8-22 mins   PT G Codes:       Zenovia Jarred, PT, DPT 05/13/2016 Pager: [phone]   Maida Sale E 05/13/2016, 12:42 PM

## 2016-05-13 NOTE — Care Management Note (Signed)
Case Management Note  Patient Details  Name: Suzanne Kim MRN: [id number] Date of Birth: 1940/04/07  Subjective/Objective:        hematesis            Action/Plan:Date:  May 13, 2016 Chart reviewed for concurrent status and case management needs. Will continue to follow patient progress. Discharge Planning: following for needs Expected discharge date: [id number] [name], BSN, [place], [place]   [phone]   Expected Discharge Date:   (unknown)               Expected Discharge Plan:  Home/Self Care  In-House Referral:     Discharge planning Services     Post Acute Care Choice:    Choice offered to:     DME Arranged:    DME Agency:     HH Arranged:    HH Agency:     Status of Service:  In process, will continue to follow  If discussed at Long Length of Stay Meetings, dates discussed:    Additional Comments:  [name], RN 05/13/2016, 9:43 AM

## 2016-05-13 NOTE — Progress Notes (Signed)
Patient ID: Suzanne Kim, female   DOB: 01/05/1941, 76 y.o.   MRN: [id number]    PROGRESS NOTE    YUDIT MODESITT  WUJ:[id number] DOB: Jul 12, 1940 DOA: 05/11/2016  PCP: Gaspar Garbe, MD   Brief Narrative:   76 y.o. female with known multiple sclerosis, vitamin D deficiency, presented to [facility] emergency department with acute onset of coffee-ground emesis that started earlier in the day prior to this admission. Patient reports she woke up feeling sick to her stomach and had 3 episodes of bloody vomiting, at least 2 of the episodes for coffee ground. Patient reports associated abdominal discomfort in the epigastric area, dizziness and weakness. She reports feeling tired and had to lay down. In addition patient reports one episode of bloody bowel movement, stools very dark. Patient reports she takes Advil daily once to twice per day. Patient denies similar events in the past. Patient also denies chest pain or shortness of breath, no specific urinary concerns.  In emergency department, patient found to be hemodynamically stable, vital signs stable, blood work notable for WBC 11.6, hemoglobin 10.2 (unknown baseline), platelets 149, chloride 114 creatinine 0.7, BUN 55. FOBT positive for stool. LFT panel pending, GI team was consulted by emergency room doctor. Plan to admit to inpatient unit for further evaluation.  Assessment & Plan:   Active Problems:   GI bleed, acute blood loss anemia, thrombocytopenia  - Unknown etiology but symptomatic with dizziness and weakness  - Possible NSAID use contriobuting  - per EGD 4/26. Small cameron type erosions, plan for one U PRBC transfusion today  - repeat CBC in am and possible d/c home in AM if CBC stable    Tachycardia - Appears to be reactive secondary to acute GI bleed - resolved    Transaminitis - unclear etiology, no suggestion of liver disease per [place] - so far LFT's trending down - repeat LFT's in AM    Leukocytosis - reactive  form acute GI bleeding  - resolved     Dehydration - From GI bleed, placed on IV fluids for now - stop IVF    Multiple sclerosis - At baseline using Neurontin which we'll continue here - No NSAID use for now - more spasms this AM, plan to add robaxin as needed   DVT prophylaxis: SCD's Code Status: Full Family Communication: Patient at bedside  Disposition Plan: to be determined once PT eval done, possibly home in AM  Consultants:   GI  Procedures:   EGD 4/26  Antimicrobials:   None   Subjective: No events overnight, pt reports spasms in LE's, thinks it is due to MS  Objective: Vitals:   05/12/16 1230 05/12/16 1405 05/12/16 2042 05/13/16 0537  BP: (!) 115/49 (!) 110/58 (!) 118/43 (!) 113/46  Pulse:   75 75  Resp:   18 18  Temp:  98.3 F (36.8 C) 98.2 F (36.8 C) 98.5 F (36.9 C)  TempSrc:  Oral Oral Oral  SpO2:  100% 100% 98%  Weight:      Height:        Intake/Output Summary (Last 24 hours) at 05/13/16 1210 Last data filed at 05/13/16 1005  Gross per 24 hour  Intake              592 ml  Output                0 ml  Net              592 ml  Filed Weights   05/11/16 2035  Weight: 52.2 kg (115 lb)    Examination:  General exam: Appears calm and comfortable  Respiratory system: Clear to auscultation. Respiratory effort normal. Cardiovascular system: S1 & S2 heard, RRR. No JVD, murmurs, rubs, gallops or clicks. No pedal edema. Gastrointestinal system: Abdomen is nondistended, soft and nontender. No organomegaly or masses felt.  Central nervous system: Alert and oriented. No focal neurological deficits. Extremities: Symmetric 5 x 5 power. Skin: No rashes, lesions or ulcers Psychiatry: Judgement and insight appear normal. Mood & affect appropriate.   Data Reviewed: I have personally reviewed following labs and imaging studies  CBC:  Recent Labs Lab 05/11/16 1627 05/11/16 2258 05/12/16 0726 05/12/16 1515 05/13/16 0636  WBC 11.6* 11.7* 7.8  7.1 6.6  NEUTROABS 9.8*  --   --  4.7  --   HGB 10.2* 9.6* 7.9* 8.3* 7.9*  HCT 31.8* 28.7* 24.2* 26.7* 24.2*  MCV 88.3 89.1 88.6 92.4 91.0  PLT 149* 130* 112* 124* 104*   Basic Metabolic Panel:  Recent Labs Lab 05/11/16 1627 05/12/16 0726 05/13/16 0636  NA 142 144 145  K 4.6 3.7 3.7  CL 114* 117* 116*  CO2 23 20* 23  GLUCOSE 107* 93 98  BUN 55* 40* 23*  CREATININE 0.70 0.82 0.82  CALCIUM 8.3* 8.2* 8.4*   Liver Function Tests:  Recent Labs Lab 05/11/16 1627 05/12/16 0726 05/13/16 0636  AST 327* 119* 56*  ALT 465* 273* 181*  ALKPHOS 116 82 69  BILITOT 3.4* 1.0 0.6  PROT 6.4* 5.1* 5.0*  ALBUMIN 3.4* 2.9* 2.8*   Coagulation Profile:  Recent Labs Lab 05/11/16 1627  INR 1.13    Recent Labs  05/11/16 2258  TSH 1.664   Radiology Studies: US Abdomen Limited  Result Date: 05/11/2016 CLINICAL DATA:  Acute right upper quadrant pain EXAM: US ABDOMEN LIMITED - RIGHT UPPER QUADRANT COMPARISON:  None. FINDINGS: Gallbladder: Mild gallbladder distention. No visible stones or wall thickening. Negative sonographic Murphy's. Common bile duct: Diameter: Mildly dilated, measuring 7 mm proximally and 9 mm distally. No visible stones. Liver: No focal lesion identified. Within normal limits in parenchymal echogenicity. IMPRESSION: Mild common bile duct dilatation measuring up to 9 mm. Recommend correlation with LFTs. If further evaluation is felt warranted to exclude distal obstructing stone or mass, MRCP may be beneficial. Mildly distended gallbladder without visible stones, wall thickening or sonographic Murphy sign. Electronically Signed   By: Charlett Nose M.D.   On: 05/11/2016 18:18    Scheduled Meds: . cholecalciferol  4,000 Units Oral QHS  . gabapentin  200 mg Oral QHS  . meclizine  25 mg Oral Daily  . [START ON 05/14/2016] pantoprazole  40 mg Oral Daily  . raloxifene  60 mg Oral QHS  . sodium chloride flush  3 mL Intravenous Q12H   Continuous Infusions: . sodium chloride     . methocarbamol (ROBAXIN)  IV Stopped (05/12/16 2350)     LOS: 2 days   Time spent: 20 minutes   Debbora Presto, MD [facility] Pager [phone]  If 7PM-7AM, please contact night-coverage [email or web address] Password [email or web address] 05/13/2016, 12:10 PM

## 2016-05-14 LAB — CBC
HCT: 29.5 % — ABNORMAL LOW (ref 36.0–46.0)
HEMOGLOBIN: 9.5 g/dL — AB (ref 12.0–15.0)
MCH: 29.1 pg (ref 26.0–34.0)
MCHC: 32.2 g/dL (ref 30.0–36.0)
MCV: 90.5 fL (ref 78.0–100.0)
Platelets: 116 10*3/uL — ABNORMAL LOW (ref 150–400)
RBC: 3.26 MIL/uL — AB (ref 3.87–5.11)
RDW: 13.9 % (ref 11.5–15.5)
WBC: 6.6 10*3/uL (ref 4.0–10.5)

## 2016-05-14 MED ORDER — CYCLOBENZAPRINE HCL 5 MG PO TABS
5.0000 mg | ORAL_TABLET | Freq: Three times a day (TID) | ORAL | Status: DC | PRN
  Administered 2016-05-14 – 2016-05-15 (×2): 5 mg via ORAL
  Filled 2016-05-14 (×2): qty 1

## 2016-05-14 MED ORDER — HYDROCODONE-ACETAMINOPHEN 5-325 MG PO TABS
1.0000 | ORAL_TABLET | ORAL | Status: DC | PRN
  Administered 2016-05-14 – 2016-05-16 (×3): 1 via ORAL
  Filled 2016-05-14 (×3): qty 1

## 2016-05-14 NOTE — Progress Notes (Signed)
Patient ID: Suzanne Kim, female   DOB: 1940/10/18, 76 y.o.   MRN: [id number]    PROGRESS NOTE  Suzanne Kim  WUJ:[id number] DOB: [birth year] DOA: 05/11/2016  PCP: Gaspar Garbe, MD   Brief Narrative:   76 y.o. female with known multiple sclerosis, vitamin D deficiency, presented to [facility] emergency department with acute onset of coffee-ground emesis that started earlier in the day prior to this admission. Patient reports she woke up feeling sick to her stomach and had 3 episodes of bloody vomiting, at least 2 of the episodes for coffee ground. Patient reports associated abdominal discomfort in the epigastric area, dizziness and weakness. She reports feeling tired and had to lay down. In addition patient reports one episode of bloody bowel movement, stools very dark. Patient reports she takes Advil daily once to twice per day. Patient denies similar events in the past. Patient also denies chest pain or shortness of breath, no specific urinary concerns.  In emergency department, patient found to be hemodynamically stable, vital signs stable, blood work notable for WBC 11.6, hemoglobin 10.2 (unknown baseline), platelets 149, chloride 114 creatinine 0.7, BUN 55. FOBT positive for stool. LFT panel pending, GI team was consulted by emergency room doctor. Plan to admit to inpatient unit for further evaluation.  Assessment & Plan:   Active Problems:   GI bleed, acute blood loss anemia, thrombocytopenia  - Possible NSAID use contriobuting  - per EGD 4/26. Small cameron type erosions - pt is s/p 1 U PRBC transfusion 4/27 and Hg up from 7.9 --> 9.5 - plt count improving  - pt reports feeling better   Tachycardia - Appears to be reactive secondary to acute GI bleed - resolved    Transaminitis - unclear etiology, no suggestion of liver disease per [place] - so far LFT's trending down - repeat LFT's in AM    Leukocytosis - reactive form acute GI bleeding  - resolved    Dehydration - From GI bleed, placed on IV fluids for now - IVF have been stopped 4/27 - tolerating diet well     Multiple sclerosis - At baseline using Neurontin which we'll continue here - No NSAID use for now - more spasms this AM, robaxin not helping - try flexeril  - SNF recommended, SW consulted for assistance   DVT prophylaxis: SCD's Code Status: Full Family Communication: Patient at bedside  Disposition Plan: SNF when bed available   Consultants:   GI  Procedures:   EGD 4/26  Antimicrobials:   None  Subjective: No events overnight, pt reports spasms in LE's, thinks it is due to MS  Objective: Vitals:   05/13/16 1252 05/13/16 1514 05/13/16 2030 05/14/16 0433  BP: (!) 101/42 (!) 114/38 (!) 110/29 (!) 119/41  Pulse: 60 76 85 62  Resp: 18 18 20 18   Temp: 98.4 F (36.9 C) 98.5 F (36.9 C) 98 F (36.7 C) 98.1 F (36.7 C)  TempSrc: Oral Oral Oral Oral  SpO2: 100% 100% 94% 96%  Weight:      Height:        Intake/Output Summary (Last 24 hours) at 05/14/16 1115 Last data filed at 05/13/16 1845  Gross per 24 hour  Intake              815 ml  Output                0 ml  Net              815  ml   Filed Weights   05/11/16 2035  Weight: 52.2 kg (115 lb)    Examination:  General exam: Appears calm and comfortable  Respiratory system: Clear to auscultation. Respiratory effort normal. Cardiovascular system: S1 & S2 heard, RRR. No JVD, murmurs, rubs, gallops or clicks. No pedal edema. Gastrointestinal system: Abdomen is nondistended, soft and nontender. No organomegaly or masses felt.  Central nervous system: Alert and oriented. No focal neurological deficits. Extremities: Symmetric 5 x 5 power. Skin: No rashes, lesions or ulcers Psychiatry: Judgement and insight appear normal. Mood & affect appropriate.   Data Reviewed: I have personally reviewed following labs and imaging studies  CBC:  Recent Labs Lab 05/11/16 1627 05/11/16 2258 05/12/16 0726  05/12/16 1515 05/13/16 0636 05/14/16 0616  WBC 11.6* 11.7* 7.8 7.1 6.6 6.6  NEUTROABS 9.8*  --   --  4.7  --   --   HGB 10.2* 9.6* 7.9* 8.3* 7.9* 9.5*  HCT 31.8* 28.7* 24.2* 26.7* 24.2* 29.5*  MCV 88.3 89.1 88.6 92.4 91.0 90.5  PLT 149* 130* 112* 124* 104* 116*   Basic Metabolic Panel:  Recent Labs Lab 05/11/16 1627 05/12/16 0726 05/13/16 0636  NA 142 144 145  K 4.6 3.7 3.7  CL 114* 117* 116*  CO2 23 20* 23  GLUCOSE 107* 93 98  BUN 55* 40* 23*  CREATININE 0.70 0.82 0.82  CALCIUM 8.3* 8.2* 8.4*   Liver Function Tests:  Recent Labs Lab 05/11/16 1627 05/12/16 0726 05/13/16 0636  AST 327* 119* 56*  ALT 465* 273* 181*  ALKPHOS 116 82 69  BILITOT 3.4* 1.0 0.6  PROT 6.4* 5.1* 5.0*  ALBUMIN 3.4* 2.9* 2.8*   Coagulation Profile:  Recent Labs Lab 05/11/16 1627  INR 1.13    Recent Labs  05/11/16 2258  TSH 1.664   Radiology Studies: No results found.  Scheduled Meds: . cholecalciferol  4,000 Units Oral QHS  . gabapentin  200 mg Oral QHS  . meclizine  25 mg Oral Daily  . pantoprazole  40 mg Oral Daily  . raloxifene  60 mg Oral QHS  . sodium chloride flush  3 mL Intravenous Q12H   Continuous Infusions: . sodium chloride    . methocarbamol (ROBAXIN)  IV 500 mg (05/14/16 0616)     LOS: 3 days   Time spent: 20 minutes   Debbora Presto, MD [facility] Pager [phone]  If 7PM-7AM, please contact night-coverage [email or web address] Password [facility] 05/14/2016, 11:15 AM

## 2016-05-15 LAB — CBC
HEMATOCRIT: 31.3 % — AB (ref 36.0–46.0)
Hemoglobin: 10.1 g/dL — ABNORMAL LOW (ref 12.0–15.0)
MCH: 29.3 pg (ref 26.0–34.0)
MCHC: 32.3 g/dL (ref 30.0–36.0)
MCV: 90.7 fL (ref 78.0–100.0)
PLATELETS: 138 10*3/uL — AB (ref 150–400)
RBC: 3.45 MIL/uL — ABNORMAL LOW (ref 3.87–5.11)
RDW: 14 % (ref 11.5–15.5)
WBC: 8.5 10*3/uL (ref 4.0–10.5)

## 2016-05-15 LAB — COMPREHENSIVE METABOLIC PANEL
ALBUMIN: 3.1 g/dL — AB (ref 3.5–5.0)
ALT: 102 U/L — ABNORMAL HIGH (ref 14–54)
ANION GAP: 5 (ref 5–15)
AST: 36 U/L (ref 15–41)
Alkaline Phosphatase: 68 U/L (ref 38–126)
BILIRUBIN TOTAL: 0.5 mg/dL (ref 0.3–1.2)
BUN: 16 mg/dL (ref 6–20)
CHLORIDE: 116 mmol/L — AB (ref 101–111)
CO2: 25 mmol/L (ref 22–32)
Calcium: 8.6 mg/dL — ABNORMAL LOW (ref 8.9–10.3)
Creatinine, Ser: 0.75 mg/dL (ref 0.44–1.00)
GFR calc Af Amer: >60 mL/min (ref >60)
Glucose, Bld: 108 mg/dL — ABNORMAL HIGH (ref 65–99)
POTASSIUM: 3.4 mmol/L — AB (ref 3.5–5.1)
Sodium: 146 mmol/L — ABNORMAL HIGH (ref 135–145)
TOTAL PROTEIN: 5.6 g/dL — AB (ref 6.5–8.1)

## 2016-05-15 MED ORDER — POTASSIUM CHLORIDE CRYS ER 20 MEQ PO TBCR
40.0000 meq | EXTENDED_RELEASE_TABLET | Freq: Once | ORAL | Status: AC
  Administered 2016-05-15: 40 meq via ORAL
  Filled 2016-05-15: qty 2

## 2016-05-15 NOTE — Clinical Social Work Note (Signed)
Clinical Social Work Assessment  Patient Details  Name: Suzanne Kim MRN: [id number] Date of Birth: 1940/10/27  Date of referral:  05/15/16               Reason for consult:  Facility Placement                Permission sought to share information with:  Oceanographer granted to share information::  Yes, Verbal Permission Granted  Name::        Agency::     Relationship::     Contact Information:     Housing/Transportation Living arrangements for the past 2 months:  Single Family Home Source of Information:  Patient Patient Interpreter Needed:  None Criminal Activity/Legal Involvement Pertinent to Current Situation/Hospitalization:  No - Comment as needed Significant Relationships:  Friend, Neighbor Lives with:  Self Do you feel safe going back to the place where you live?  No Need for family participation in patient care:  No (Coment)  Care giving concerns:  CSW received consult for SNF placement, reviewed PT evaluation recommending SNF at discharge.    Social Worker assessment / plan:  CSW spoke with patient re: discharge planning - patient is agreeable with plan for SNF. Though she has never been to a facility before herself, she has visited a friend at [place] and states that would be the closest and most convenient to her friend/neighbor, [place]. CSW submitted information to [facility] and provided list of facilities to patient. Weekday CSW to follow-up with bed offers when available.   Employment status:  Retired Health and safety inspector:  [employer] PT Recommendations:  Skilled Nursing Facility Information / Referral to community resources:  Skilled Nursing Facility  Patient/Family's Response to care:  Patient states that she would prefer to go to a facility that is able to get a lift chair, which is what she is used to sleep in and live in essentially due to her diagnosis of MS.   Patient/Family's Understanding of and Emotional  Response to Diagnosis, Current Treatment, and Prognosis:  Patient states that she feels like she is doing better, but due to not sleeping in her lift chair as she does at home - being stretched out in the bed has been hurting her back.   Emotional Assessment Appearance:  Appears stated age Attitude/Demeanor/Rapport:    Affect (typically observed):  Accepting, Adaptable, Pleasant Orientation:  Oriented to Self, Oriented to Place, Oriented to  Time, Oriented to Situation Alcohol / Substance use:    Psych involvement (Current and /or in the community):     Discharge Needs  Concerns to be addressed:    Readmission within the last 30 days:    Current discharge risk:    Barriers to Discharge:      Arlyss Repress, LCSW 05/15/2016, 2:31 PM

## 2016-05-15 NOTE — Progress Notes (Signed)
Pt has been sitting up in chair for several hours,tolerated well. Appetite fair. Incontinent several times. Denies any pain.

## 2016-05-15 NOTE — NC FL2 (Signed)
[place] MEDICAID FL2 LEVEL OF CARE SCREENING TOOL     IDENTIFICATION  Patient Name: Suzanne Kim Birthdate: [birth year] Sex: female Admission Date (Current Location): 05/11/2016  [facility] and [place] Number:  Producer, television/film/video and Address:  [facility],  [address] [place] [address]      Provider Number: [id number]  Attending Physician Name and Address:  Dorothea Ogle, MD  Relative Name and Phone Number:       Current Level of Care: Hospital Recommended Level of Care: Skilled Nursing Facility Prior Approval Number:    Date Approved/Denied:   PASRR Number: [id number]  Discharge Plan: SNF    Current Diagnoses: Patient Active Problem List   Diagnosis Date Noted  . GI bleed 05/11/2016  . Muscle spasm of right lower extremity 10/14/2013  . Overactive bladder 10/14/2013  . Hip pain, bilateral 10/14/2013  . Bilateral low back pain without sciatica 10/14/2013  . Secondary progressive multiple sclerosis (HCC) 04/09/2013    Orientation RESPIRATION BLADDER Height & Weight     Self, Time, Situation, Place  Normal Continent Weight: 115 lb (52.2 kg) Height:  5\' 2"  (157.5 cm)  BEHAVIORAL SYMPTOMS/MOOD NEUROLOGICAL BOWEL NUTRITION STATUS      Continent Diet (soft diet)  AMBULATORY STATUS COMMUNICATION OF NEEDS Skin   Extensive Assist Verbally Normal                       Personal Care Assistance Level of Assistance  Bathing, Dressing Bathing Assistance: Limited assistance   Dressing Assistance: Limited assistance     Functional Limitations Info             SPECIAL CARE FACTORS FREQUENCY  PT (By licensed PT), OT (By licensed OT)     PT Frequency: 5 OT Frequency: 5            Contractures      Additional Factors Info  Code Status, Allergies Code Status Info: Fullcode Allergies Info: NKDA           Current Medications (05/15/2016):  This is the current hospital active medication list Current Facility-Administered  Medications  Medication Dose Route Frequency Provider Last Rate Last Dose  . 0.9 %  sodium chloride infusion   Intravenous Once Dorothea Ogle, MD      . cholecalciferol (VITAMIN D) tablet 4,000 Units  4,000 Units Oral QHS Dorothea Ogle, MD   4,000 Units at 05/14/16 2222  . cyclobenzaprine (FLEXERIL) tablet 5 mg  5 mg Oral TID PRN Dorothea Ogle, MD   5 mg at 05/14/16 2221  . gabapentin (NEURONTIN) capsule 200 mg  200 mg Oral QHS Dorothea Ogle, MD   200 mg at 05/14/16 2222  . HYDROcodone-acetaminophen (NORCO/VICODIN) 5-325 MG per tablet 1-2 tablet  1-2 tablet Oral Q4H PRN Dorothea Ogle, MD   1 tablet at 05/14/16 2222  . meclizine (ANTIVERT) tablet 25 mg  25 mg Oral Daily Dorothea Ogle, MD   25 mg at 05/15/16 1040  . ondansetron (ZOFRAN) tablet 4 mg  4 mg Oral Q6H PRN Dorothea Ogle, MD       Or  . ondansetron [facility]) injection 4 mg  4 mg Intravenous Q6H PRN Dorothea Ogle, MD      . pantoprazole (PROTONIX) EC tablet 40 mg  40 mg Oral Daily Bernette Redbird, MD   40 mg at 05/15/16 1040  . raloxifene (EVISTA) tablet 60 mg  60 mg Oral QHS Iskra  Aurther Loft, MD   60 mg at 05/14/16 2222  . sodium chloride flush (NS) 0.9 % injection 3 mL  3 mL Intravenous Q12H Dorothea Ogle, MD   3 mL at 05/13/16 2138  . traMADol (ULTRAM) tablet 50 mg  50 mg Oral Q6H PRN Leda Gauze, NP   50 mg at 05/14/16 0623     Discharge Medications: Please see discharge summary for a list of discharge medications.  Relevant Imaging Results:  Relevant Lab Results:   Additional Information SSN: [id number]  Arlyss Repress, LCSW

## 2016-05-15 NOTE — Clinical Social Work Placement (Addendum)
   CLINICAL SOCIAL WORK PLACEMENT  NOTE  Date:  05/15/2016  Patient Details  Name: Suzanne Kim MRN: [id number] Date of Birth: 11-01-1940  Clinical Social Work is seeking post-discharge placement for this patient at the Skilled  Nursing Facility level of care (*CSW will initial, date and re-position this form in  chart as items are completed):  Yes   Patient/family provided with [place] Clinical Social Work Department's list of facilities offering this level of care within the geographic area requested by the patient (or if unable, by the patient's family).  Yes   Patient/family informed of their freedom to choose among providers that offer the needed level of care, that participate in Medicare, Medicaid or managed care program needed by the patient, have an available bed and are willing to accept the patient.  Yes   Patient/family informed of [place]'s ownership interest in [facility] and [facility], as well as of the fact that they are under no obligation to receive care at these facilities.  PASRR submitted to EDS on 05/15/16     PASRR number received on 05/15/16     Existing PASRR number confirmed on       FL2 transmitted to all facilities in geographic area requested by pt/family on 05/15/16     FL2 transmitted to all facilities within larger geographic area on       Patient informed that his/her managed care company has contracts with or will negotiate with certain facilities, including the following:            Patient/family informed of bed offers received.   Patient chooses bed at    [facility]   Physician recommends and patient chooses bed at      Patient to be transferred to   on  .05/16/2016  Patient to be transferred to facility by     [facility]  Patient family notified on   of transfer. 05/16/2016  Name of family member notified:      Friend-[name]  PHYSICIAN       Additional Comment:     _______________________________________________ Arlyss Repress, LCSW 05/15/2016, 2:39 PM

## 2016-05-15 NOTE — Progress Notes (Signed)
Patient ID: Suzanne Kim, female   DOB: 24-Sep-1940, 76 y.o.   MRN: [id number]    PROGRESS NOTE  Suzanne Kim  WUJ:[id number] DOB: Jun 10, 1940 DOA: 05/11/2016  PCP: Gaspar Garbe, MD   Brief Narrative:   76 y.o. female with known multiple sclerosis, vitamin D deficiency, presented to [facility] emergency department with acute onset of coffee-ground emesis that started earlier in the day prior to this admission. Patient reports she woke up feeling sick to her stomach and had 3 episodes of bloody vomiting, at least 2 of the episodes for coffee ground. Patient reports associated abdominal discomfort in the epigastric area, dizziness and weakness. She reports feeling tired and had to lay down. In addition patient reports one episode of bloody bowel movement, stools very dark. Patient reports she takes Advil daily once to twice per day. Patient denies similar events in the past. Patient also denies chest pain or shortness of breath, no specific urinary concerns.  In emergency department, patient found to be hemodynamically stable, vital signs stable, blood work notable for WBC 11.6, hemoglobin 10.2 (unknown baseline), platelets 149, chloride 114 creatinine 0.7, BUN 55. FOBT positive for stool. LFT panel pending, GI team was consulted by emergency room doctor. Plan to admit to inpatient unit for further evaluation.  Assessment & Plan:   Active Problems:   GI bleed, acute blood loss anemia, thrombocytopenia  - Possible NSAID use contriobuting  - per EGD 4/26. Small cameron type erosions - pt is s/p 1 U PRBC transfusion 4/27 and Hg up from 7.9 --> 9.5 --> 10.1 - plt count improving  - pt reports feeling better   Tachycardia - Appears to be reactive secondary to acute GI bleed - resolved    Transaminitis - unclear etiology, no suggestion of liver disease per [place] - so far LFT's trending down - repeat LFT's in AM    Leukocytosis - reactive form acute GI bleeding  - resolved    Dehydration, hypernatremia  - From GI bleed, placed on IV fluids for now - IVF have been stopped 4/27 and Na is up a bit this AM - encouraged oral intake  - tolerating diet well     Hypokalemia - supplement and repeat BMP in AM    Multiple sclerosis - At baseline using Neurontin which we'll continue here - No NSAID use for now - more spasms this AM, robaxin not helping - try flexeril  - SNF recommended, SW consulted for assistance   DVT prophylaxis: SCD's Code Status: Full Family Communication: Patient at bedside  Disposition Plan: SNF when bed available   Consultants:   GI  Procedures:   EGD 4/26  Antimicrobials:   None  Subjective: No events overnight, pt reports spasms in LE''s better.  Objective: Vitals:   05/14/16 0433 05/14/16 1311 05/14/16 2042 05/15/16 0526  BP: (!) 119/41 (!) 102/48 (!) 116/45 (!) 137/54  Pulse: 62 76 77 77  Resp: 18 18 18 18   Temp: 98.1 F (36.7 C) 98.3 F (36.8 C) 98.5 F (36.9 C) 98.2 F (36.8 C)  TempSrc: Oral Oral Oral Oral  SpO2: 96% 100% 100% 97%  Weight:      Height:        Intake/Output Summary (Last 24 hours) at 05/15/16 1015 Last data filed at 05/14/16 1311  Gross per 24 hour  Intake              240 ml  Output  0 ml  Net              240 ml   Filed Weights   05/11/16 2035  Weight: 52.2 kg (115 lb)    Examination:  General exam: Appears calm and comfortable  Respiratory system: Clear to auscultation. Respiratory effort normal. Cardiovascular system: S1 & S2 heard, RRR. No JVD, murmurs, rubs, gallops or clicks. No pedal edema. Gastrointestinal system: Abdomen is nondistended, soft and nontender. No organomegaly or masses felt.  Central nervous system: Alert and oriented. No focal neurological deficits. Extremities: Symmetric 5 x 5 power. Skin: No rashes, lesions or ulcers Psychiatry: Judgement and insight appear normal. Mood & affect appropriate.   Data Reviewed: I have personally reviewed  following labs and imaging studies  CBC:  Recent Labs Lab 05/11/16 1627  05/12/16 0726 05/12/16 1515 05/13/16 0636 05/14/16 0616 05/15/16 0704  WBC 11.6*  < > 7.8 7.1 6.6 6.6 8.5  NEUTROABS 9.8*  --   --  4.7  --   --   --   HGB 10.2*  < > 7.9* 8.3* 7.9* 9.5* 10.1*  HCT 31.8*  < > 24.2* 26.7* 24.2* 29.5* 31.3*  MCV 88.3  < > 88.6 92.4 91.0 90.5 90.7  PLT 149*  < > 112* 124* 104* 116* 138*  < > = values in this interval not displayed. Basic Metabolic Panel:  Recent Labs Lab 05/11/16 1627 05/12/16 0726 05/13/16 0636 05/15/16 0704  NA 142 144 145 146*  K 4.6 3.7 3.7 3.4*  CL 114* 117* 116* 116*  CO2 23 20* 23 25  GLUCOSE 107* 93 98 108*  BUN 55* 40* 23* 16  CREATININE 0.70 0.82 0.82 0.75  CALCIUM 8.3* 8.2* 8.4* 8.6*   Liver Function Tests:  Recent Labs Lab 05/11/16 1627 05/12/16 0726 05/13/16 0636 05/15/16 0704  AST 327* 119* 56* 36  ALT 465* 273* 181* 102*  ALKPHOS 116 82 69 68  BILITOT 3.4* 1.0 0.6 0.5  PROT 6.4* 5.1* 5.0* 5.6*  ALBUMIN 3.4* 2.9* 2.8* 3.1*   Coagulation Profile:  Recent Labs Lab 05/11/16 1627  INR 1.13   Radiology Studies: No results found.  Scheduled Meds: . cholecalciferol  4,000 Units Oral QHS  . gabapentin  200 mg Oral QHS  . meclizine  25 mg Oral Daily  . pantoprazole  40 mg Oral Daily  . raloxifene  60 mg Oral QHS  . sodium chloride flush  3 mL Intravenous Q12H   Continuous Infusions: . sodium chloride       LOS: 4 days   Time spent: 20 minutes   Debbora Presto, MD [facility] Pager [phone]  If 7PM-7AM, please contact night-coverage [email or web address] Password [email or web address] 05/15/2016, 10:15 AM

## 2016-05-15 NOTE — Progress Notes (Signed)
Physical Therapy Treatment Patient Details Name: Suzanne Kim MRN: [id number] DOB: 1940-09-22 Today's Date: 05/15/2016    History of Present Illness 76 y.o. female with known multiple sclerosis, vitamin D deficiency and admitted for GI bleed.    PT Comments    Pt very pleasant.  Required much assist to get OOB to [facility] then to recliner.  Pt admits she sleeps in a left cahir and wears briefs but she stated she was able to stand by herself and pivot to [facility].   Currently pt requires + 2 assist for transfers and demonstrates difficulty weight shifting and pivoting 1/4 turn.  Rec nursing use a lift to assist pt back to bed.   Follow Up Recommendations  SNF;Supervision/Assistance - 24 hour     Equipment Recommendations       Recommendations for Other Services       Precautions / Restrictions Precautions Precautions: Fall Precaution Comments: wears depends at home, sleeps in lift chair Restrictions Weight Bearing Restrictions: No    Mobility  Bed Mobility Overal bed mobility: Needs Assistance Bed Mobility: Supine to Sit     Supine to sit: Max assist;HOB elevated     General bed mobility comments: increased assist due to pt not typically sleeping in bed, sleeps in lift chair.  Assisted from bed to [facility].  Transfers Overall transfer level: Needs assistance Equipment used: Rolling walker (2 wheeled) Transfers: Sit to/from [employer] Transfers   Stand pivot transfers: +2 safety/equipment       General transfer comment: assisted from elvated bed to [facility] then to recliner.  Difficulty completing pivot (feet planted)   Ambulation/Gait                 Stairs            Wheelchair Mobility    Modified Rankin (Stroke Patients Only)       Balance                                            Cognition Arousal/Alertness: Awake/alert Behavior During Therapy: WFL for tasks assessed/performed Overall Cognitive Status: Within Functional  Limits for tasks assessed                                        Exercises      General Comments        Pertinent Vitals/Pain Faces Pain Scale: Hurts a little bit Pain Location: chronic back pain Pain Descriptors / Indicators: Aching;Sore    Home Living                      Prior Function            PT Goals (current goals can now be found in the care plan section) Progress towards PT goals: Progressing toward goals    Frequency    Min 3X/week      PT Plan Current plan remains appropriate    Co-evaluation             End of Session Equipment Utilized During Treatment: Gait belt Activity Tolerance: Patient tolerated treatment well Patient left: with call bell/phone within reach;in chair Nurse Communication: Mobility status;Need for lift equipment PT Visit Diagnosis: Unsteadiness on feet (R26.81);Muscle weakness (generalized) (M62.81)     Time:  4098-1191 PT Time Calculation (min) (ACUTE ONLY): 16 min  Charges:  $Therapeutic Activity: 8-22 mins                    G Codes:       Felecia Shelling  PTA [facility]  Acute  Rehab Pager      [phone]

## 2016-05-16 ENCOUNTER — Encounter (HOSPITAL_COMMUNITY): Payer: Self-pay | Admitting: Gastroenterology

## 2016-05-16 DIAGNOSIS — K922 Gastrointestinal hemorrhage, unspecified: Secondary | ICD-10-CM | POA: Diagnosis not present

## 2016-05-16 DIAGNOSIS — K219 Gastro-esophageal reflux disease without esophagitis: Secondary | ICD-10-CM | POA: Diagnosis not present

## 2016-05-16 DIAGNOSIS — R4182 Altered mental status, unspecified: Secondary | ICD-10-CM | POA: Diagnosis not present

## 2016-05-16 DIAGNOSIS — E559 Vitamin D deficiency, unspecified: Secondary | ICD-10-CM | POA: Diagnosis not present

## 2016-05-16 DIAGNOSIS — D62 Acute posthemorrhagic anemia: Secondary | ICD-10-CM | POA: Diagnosis not present

## 2016-05-16 DIAGNOSIS — R2689 Other abnormalities of gait and mobility: Secondary | ICD-10-CM | POA: Diagnosis not present

## 2016-05-16 DIAGNOSIS — K7200 Acute and subacute hepatic failure without coma: Secondary | ICD-10-CM | POA: Diagnosis not present

## 2016-05-16 DIAGNOSIS — D500 Iron deficiency anemia secondary to blood loss (chronic): Secondary | ICD-10-CM | POA: Diagnosis not present

## 2016-05-16 DIAGNOSIS — R278 Other lack of coordination: Secondary | ICD-10-CM | POA: Diagnosis not present

## 2016-05-16 DIAGNOSIS — R41841 Cognitive communication deficit: Secondary | ICD-10-CM | POA: Diagnosis not present

## 2016-05-16 DIAGNOSIS — Z8719 Personal history of other diseases of the digestive system: Secondary | ICD-10-CM | POA: Diagnosis not present

## 2016-05-16 DIAGNOSIS — M1990 Unspecified osteoarthritis, unspecified site: Secondary | ICD-10-CM | POA: Diagnosis not present

## 2016-05-16 DIAGNOSIS — G35 Multiple sclerosis: Secondary | ICD-10-CM | POA: Diagnosis not present

## 2016-05-16 LAB — TYPE AND SCREEN
ABO/RH(D): A POS
ANTIBODY SCREEN: NEGATIVE
UNIT DIVISION: 0

## 2016-05-16 LAB — BPAM RBC
BLOOD PRODUCT EXPIRATION DATE: 201805162359
ISSUE DATE / TIME: 201804271227
UNIT TYPE AND RH: 6200

## 2016-05-16 LAB — COMPREHENSIVE METABOLIC PANEL
ALK PHOS: 59 U/L (ref 38–126)
ALT: 90 U/L — AB (ref 14–54)
AST: 32 U/L (ref 15–41)
Albumin: 2.9 g/dL — ABNORMAL LOW (ref 3.5–5.0)
Anion gap: 7 (ref 5–15)
BILIRUBIN TOTAL: 0.5 mg/dL (ref 0.3–1.2)
BUN: 17 mg/dL (ref 6–20)
CALCIUM: 8.5 mg/dL — AB (ref 8.9–10.3)
CO2: 24 mmol/L (ref 22–32)
CREATININE: 0.66 mg/dL (ref 0.44–1.00)
Chloride: 114 mmol/L — ABNORMAL HIGH (ref 101–111)
GFR calc Af Amer: >60 mL/min (ref >60)
GFR calc non Af Amer: >60 mL/min (ref >60)
Glucose, Bld: 103 mg/dL — ABNORMAL HIGH (ref 65–99)
POTASSIUM: 3.5 mmol/L (ref 3.5–5.1)
Sodium: 145 mmol/L (ref 135–145)
TOTAL PROTEIN: 5.4 g/dL — AB (ref 6.5–8.1)

## 2016-05-16 LAB — CBC
HEMATOCRIT: 30.4 % — AB (ref 36.0–46.0)
HEMOGLOBIN: 10 g/dL — AB (ref 12.0–15.0)
MCH: 29.9 pg (ref 26.0–34.0)
MCHC: 32.9 g/dL (ref 30.0–36.0)
MCV: 91 fL (ref 78.0–100.0)
Platelets: 140 10*3/uL — ABNORMAL LOW (ref 150–400)
RBC: 3.34 MIL/uL — ABNORMAL LOW (ref 3.87–5.11)
RDW: 14.3 % (ref 11.5–15.5)
WBC: 8.2 10*3/uL (ref 4.0–10.5)

## 2016-05-16 MED ORDER — TRAMADOL HCL 50 MG PO TABS: 50.0000 mg | ORAL_TABLET | Freq: Four times a day (QID) | ORAL | 0 refills | Status: DC | PRN

## 2016-05-16 MED ORDER — PANTOPRAZOLE SODIUM 40 MG PO TBEC: 40.0000 mg | DELAYED_RELEASE_TABLET | Freq: Every day | ORAL | 0 refills | Status: DC

## 2016-05-16 MED ORDER — HYDROCODONE-ACETAMINOPHEN 5-325 MG PO TABS: 1.0000 | ORAL_TABLET | ORAL | 0 refills | Status: DC | PRN

## 2016-05-16 NOTE — Progress Notes (Signed)
Date: May 16, 2016 Discharge orders review for case management needs.  None found Rhonda Davis, BSN, RN3, CCM: [phone]  

## 2016-05-16 NOTE — Progress Notes (Signed)
[facility] called for transport.  Nurse informed and given report.  Patient lift chair has been arranged by family to be delivered  tomorrow to the facility.   Vivi Barrack, Theresia Majors, MSW Clinical Social Worker 5E and Psychiatric Service Line [phone] 05/16/2016  1:52 PM

## 2016-05-16 NOTE — Care Management Important Message (Signed)
Important Message  Patient Details  Name: Suzanne Kim MRN: [id number] Date of Birth: 1940-06-10   Medicare Important Message Given:  Yes    Caren Macadam 05/16/2016, 12:24 PMImportant Message  Patient Details  Name: Suzanne Kim MRN: [id number] Date of Birth: 1940/05/31   Medicare Important Message Given:  Yes    Caren Macadam 05/16/2016, 12:24 PM

## 2016-05-16 NOTE — Progress Notes (Signed)
Nurse notified  

## 2016-05-16 NOTE — Progress Notes (Signed)
CSW met with patient at bedside, discussed discharge to [facility]. Patient agreeable to complete rehab. Patient friend Venetia Night has agreed to fill out paperwork at facility today at 1:30 pm. The facility does not have lift chairs, therefore the patient will arrange for her lift chair to be delivered to the facility.  CSW will continue to assist with patient discharge.   Kathrin Greathouse, Latanya Presser, MSW Clinical Social Worker 5E and Psychiatric Service Line [phone] 05/16/2016  10:22 AM

## 2016-05-16 NOTE — Discharge Summary (Signed)
Physician Discharge Summary  Suzanne Kim ZOX:[id number] DOB: 1940-02-20 DOA: 05/11/2016  PCP: Gaspar Garbe, MD  Admit date: 05/11/2016 Discharge date: 05/16/2016  Recommendations for Outpatient Follow-up:  1. Pt will need to follow up with PCP in 2-3 weeks post discharge 2. Please obtain BMP to evaluate electrolytes and kidney function, K level 3. Supplement K is needed  4. Please also check CBC to evaluate Hg and Hct levels  Discharge Diagnoses:  Active Problems:   GI bleed  Discharge Condition: Stable  Diet recommendation: Heart healthy diet discussed in details   Brief Narrative:   76 y.o.femalewith known multiple sclerosis, vitamin D deficiency, presented to [facility] emergency department with acute onset of coffee-ground emesis that started earlier in the day prior to this admission. Patient reports she woke up feeling sick to her stomach and had 3 episodes of bloody vomiting, at least 2 of the episodes for coffee ground. Patient reports associated abdominal discomfort in the epigastric area, dizziness and weakness. She reports feeling tired and had to lay down. In addition patient reports one episode of bloody bowel movement, stools very dark. Patient reports she takes Advil daily once to twice per day. Patient denies similar events in the past. Patient also denies chest pain or shortness of breath, no specific urinary concerns.  In emergency department, patient found to be hemodynamically stable, vital signs stable, blood work notable for WBC 11.6, hemoglobin 10.2 (unknown baseline), platelets 149, chloride 114 creatinine 0.7, BUN 55. FOBT positive for stool. LFTpanel pending, GI team was consulted by emergency room doctor. Plan to admit to inpatient unit for further evaluation.  Assessment & Plan:   Active Problems: GI bleed, acute blood loss anemia, thrombocytopenia  - Possible NSAID use contriobuting - per EGD 4/26. Small cameron type erosions - pt is  s/p 1 U PRBC transfusion 4/27 and Hg up from 7.9 --> 9.5 --> 10 - plt count improving  - pt reports feeling better  Tachycardia - Appears to be reactive secondary to acute GI bleed - resolved    Transaminitis - unclear etiology, no suggestion of liver disease per [place] - so far LFT's trending down    Leukocytosis - reactive form acute GI bleeding  - resolved   Dehydration, hypernatremia  - From GI bleed, placed on IV fluids for now - IVF have been stopped 4/27 - Na is now WNL - encouraged oral intake  - tolerating diet well     Hypokalemia - supplemented and WNL this AM  Multiple sclerosis - At baseline using Neurontin which we'll continue here - No NSAID use for now - vicodin helps, can use as needed - SNF recommended  DVT prophylaxis: SCD's Code Status: Full Family Communication: Patient at bedside  Disposition Plan: SNF   Consultants:   GI  Procedures:   EGD 4/26  Antimicrobials:   None Procedures/Studies: US Abdomen Limited  Result Date: 05/11/2016 CLINICAL DATA:  Acute right upper quadrant pain EXAM: US ABDOMEN LIMITED - RIGHT UPPER QUADRANT COMPARISON:  None. FINDINGS: Gallbladder: Mild gallbladder distention. No visible stones or wall thickening. Negative sonographic Murphy's. Common bile duct: Diameter: Mildly dilated, measuring 7 mm proximally and 9 mm distally. No visible stones. Liver: No focal lesion identified. Within normal limits in parenchymal echogenicity. IMPRESSION: Mild common bile duct dilatation measuring up to 9 mm. Recommend correlation with LFTs. If further evaluation is felt warranted to exclude distal obstructing stone or mass, MRCP may be beneficial. Mildly distended gallbladder without visible stones, wall thickening  or sonographic Murphy sign. Electronically Signed   By: Charlett Nose M.D.   On: 05/11/2016 18:18   Discharge Exam: Vitals:   05/15/16 2116 05/16/16 0442  BP: (!) 123/57 (!) 128/57  Pulse: 81 72  Resp: 18  16  Temp: 99 F (37.2 C) 98.5 F (36.9 C)   Vitals:   05/15/16 0526 05/15/16 1430 05/15/16 2116 05/16/16 0442  BP: (!) 137/54 131/74 (!) 123/57 (!) 128/57  Pulse: 77 81 81 72  Resp: 18 18 18 16   Temp: 98.2 F (36.8 C) 98.2 F (36.8 C) 99 F (37.2 C) 98.5 F (36.9 C)  TempSrc: Oral Oral Oral Oral  SpO2: 97% 100% 100% 99%  Weight:      Height:       General: Pt is alert, follows commands appropriately, not in acute distress Cardiovascular: Regular rate and rhythm, S1/S2 +, no murmurs, no rubs, no gallops Respiratory: Clear to auscultation bilaterally, no wheezing, no crackles, no rhonchi Abdominal: Soft, non tender, non distended, bowel sounds +, no guarding  Discharge Instructions  Discharge Instructions    Diet - low sodium heart healthy    Complete by:  As directed    Increase activity slowly    Complete by:  As directed      Allergies as of 05/16/2016   No Known Allergies     Medication List    STOP taking these medications   omeprazole 20 MG capsule Commonly known as:  PRILOSEC     TAKE these medications   cholecalciferol 1000 units tablet Commonly known as:  VITAMIN D Take 4,000 Units by mouth at bedtime.   gabapentin 100 MG capsule Commonly known as:  NEURONTIN Take 200 mg by mouth at bedtime.   HYDROcodone-acetaminophen 5-325 MG tablet Commonly known as:  NORCO/VICODIN Take 1-2 tablets by mouth every 4 (four) hours as needed for moderate pain or severe pain.   meclizine 25 MG tablet Commonly known as:  ANTIVERT Take 25 mg by mouth daily.   pantoprazole 40 MG tablet Commonly known as:  PROTONIX Take 1 tablet (40 mg total) by mouth daily. Start taking on:  05/17/2016   raloxifene 60 MG tablet Commonly known as:  EVISTA Take 60 mg by mouth at bedtime.   traMADol 50 MG tablet Commonly known as:  ULTRAM Take 1 tablet (50 mg total) by mouth every 6 (six) hours as needed for moderate pain.        Contact information for follow-up providers     Gaspar Garbe, MD Follow up.   Specialty:  Internal Medicine Contact information: [address] [place] [address] [phone]            Contact information for after-discharge care    Destination    [facility] SN[facility] Follow up.   Specialty:  Skilled Nursing Facility Contact information: [address] [place] [address] [phone]                   The results of significant diagnostics from this hospitalization (including imaging, microbiology, ancillary and laboratory) are listed below for reference.     Microbiology: No results found for this or any previous visit (from the past 240 hour(s)).   Labs: Basic Metabolic Panel:  Recent Labs Lab 05/11/16 1627 05/12/16 0726 05/13/16 0636 05/15/16 0704 05/16/16 0708  NA 142 144 145 146* 145  K 4.6 3.7 3.7 3.4* 3.5  CL 114* 117* 116* 116* 114*  CO2 23 20* 23 25 24  GLUCOSE 107* 93 98 108* 103*  BUN 55* 40* 23* 16 17  CREATININE 0.70 0.82 0.82 0.75 0.66  CALCIUM 8.3* 8.2* 8.4* 8.6* 8.5*   Liver Function Tests:  Recent Labs Lab 05/11/16 1627 05/12/16 0726 05/13/16 0636 05/15/16 0704 05/16/16 0708  AST 327* 119* 56* 36 32  ALT 465* 273* 181* 102* 90*  ALKPHOS 116 82 69 68 59  BILITOT 3.4* 1.0 0.6 0.5 0.5  PROT 6.4* 5.1* 5.0* 5.6* 5.4*  ALBUMIN 3.4* 2.9* 2.8* 3.1* 2.9*   CBC:  Recent Labs Lab 05/11/16 1627  05/12/16 1515 05/13/16 0636 05/14/16 0616 05/15/16 0704 05/16/16 0708  WBC 11.6*  < > 7.1 6.6 6.6 8.5 8.2  NEUTROABS 9.8*  --  4.7  --   --   --   --   HGB 10.2*  < > 8.3* 7.9* 9.5* 10.1* 10.0*  HCT 31.8*  < > 26.7* 24.2* 29.5* 31.3* 30.4*  MCV 88.3  < > 92.4 91.0 90.5 90.7 91.0  PLT 149*  < > 124* 104* 116* 138* 140*  < > = values in this interval not displayed.  SIGNED: Time coordinating discharge: 30 minutes  Debbora Presto, MD  [facility] 05/16/2016, 12:35 PM Pager [phone]  If 7PM-7AM, please contact  night-coverage [email or web address] Password [email or web address]

## 2016-05-16 NOTE — Discharge Instructions (Signed)
Gastrointestinal Bleeding °Gastrointestinal bleeding is bleeding somewhere along the path food travels through the body (digestive tract). This path is anywhere between the mouth and the opening of the butt (anus). You may have blood in your poop (stools) or have black poop. If you throw up (vomit), there may be blood in it. °This condition can be mild, serious, or even life-threatening. If you have a lot of bleeding, you may need to stay in the hospital. °Follow these instructions at home: °· Take over-the-counter and prescription medicines only as told by your doctor. °· Eat foods that have a lot of fiber in them. These foods include whole grains, fruits, and vegetables. You can also try eating 1-3 prunes each day. °· Drink enough fluid to keep your pee (urine) clear or pale yellow. °· Keep all follow-up visits as told by your doctor. This is important. °Contact a doctor if: °· Your symptoms do not get better. °Get help right away if: °· Your bleeding gets worse. °· You feel dizzy or you pass out (faint). °· You feel weak. °· You have very bad cramps in your back or belly (abdomen). °· You pass large clumps of blood (clots) in your poop. °· Your symptoms are getting worse. °This information is not intended to replace advice given to you by your health care provider. Make sure you discuss any questions you have with your health care provider. °Document Released: 10/13/2007 Document Revised: 06/11/2015 Document Reviewed: 06/23/2014 °Elsevier Interactive Patient Education © 2017 Elsevier Inc. ° °

## 2016-05-16 NOTE — Progress Notes (Signed)
IV removed, pt dressing and belongings gathered. Patient to [facility] via [facility]. Report called to [facility].

## 2016-05-17 DIAGNOSIS — G35 Multiple sclerosis: Secondary | ICD-10-CM | POA: Diagnosis not present

## 2016-05-17 DIAGNOSIS — D62 Acute posthemorrhagic anemia: Secondary | ICD-10-CM | POA: Diagnosis not present

## 2016-05-17 DIAGNOSIS — M1990 Unspecified osteoarthritis, unspecified site: Secondary | ICD-10-CM | POA: Diagnosis not present

## 2016-05-17 DIAGNOSIS — K922 Gastrointestinal hemorrhage, unspecified: Secondary | ICD-10-CM | POA: Diagnosis not present

## 2016-05-18 DIAGNOSIS — D500 Iron deficiency anemia secondary to blood loss (chronic): Secondary | ICD-10-CM | POA: Diagnosis not present

## 2016-05-18 DIAGNOSIS — K922 Gastrointestinal hemorrhage, unspecified: Secondary | ICD-10-CM | POA: Diagnosis not present

## 2016-05-18 DIAGNOSIS — G35 Multiple sclerosis: Secondary | ICD-10-CM | POA: Diagnosis not present

## 2016-05-26 DIAGNOSIS — M1990 Unspecified osteoarthritis, unspecified site: Secondary | ICD-10-CM | POA: Diagnosis not present

## 2016-05-26 DIAGNOSIS — K922 Gastrointestinal hemorrhage, unspecified: Secondary | ICD-10-CM | POA: Diagnosis not present

## 2016-05-26 DIAGNOSIS — G35 Multiple sclerosis: Secondary | ICD-10-CM | POA: Diagnosis not present

## 2016-05-26 DIAGNOSIS — D62 Acute posthemorrhagic anemia: Secondary | ICD-10-CM | POA: Diagnosis not present

## 2016-06-01 ENCOUNTER — Other Ambulatory Visit: Payer: Self-pay | Admitting: *Deleted

## 2016-06-01 NOTE — Patient Outreach (Signed)
[facility]) Care Management  06/01/2016  Suzanne Kim Dec 24, 1940 [id number]   Met with patient at facility. To assess for [facility] care management services.  Patient reports she has MS but is healthy otherwise.  Patient reports she takes very few medications and none are very expensive.  She has a supportive neighbor who assists her with transportation.  Patient states she is supposed to have a care plan meeting with SW re: discharge, she wants her neighbor who is her support system to be at meeting.   RNCM reviewed [facility] program. Left [facility] brochure and RNCM contact. RNCM let Lowella Fairy, SW at facility to know about patient anticipating a care plan meeting and wants to schedule it a couple of days out so her neighbor can attend.  Plan to sign off. Royetta Crochet. Laymond Purser, RN, BSN, [place] [phone]) Business Cell  [phone]) Toll Free Office

## 2016-06-07 DIAGNOSIS — D62 Acute posthemorrhagic anemia: Secondary | ICD-10-CM | POA: Diagnosis not present

## 2016-06-07 DIAGNOSIS — G35 Multiple sclerosis: Secondary | ICD-10-CM | POA: Diagnosis not present

## 2016-06-07 DIAGNOSIS — M1990 Unspecified osteoarthritis, unspecified site: Secondary | ICD-10-CM | POA: Diagnosis not present

## 2016-06-07 DIAGNOSIS — K922 Gastrointestinal hemorrhage, unspecified: Secondary | ICD-10-CM | POA: Diagnosis not present

## 2016-06-15 DIAGNOSIS — Z8719 Personal history of other diseases of the digestive system: Secondary | ICD-10-CM | POA: Diagnosis not present

## 2016-06-15 DIAGNOSIS — G35 Multiple sclerosis: Secondary | ICD-10-CM | POA: Diagnosis not present

## 2016-06-15 DIAGNOSIS — D500 Iron deficiency anemia secondary to blood loss (chronic): Secondary | ICD-10-CM | POA: Diagnosis not present

## 2016-06-15 DIAGNOSIS — E559 Vitamin D deficiency, unspecified: Secondary | ICD-10-CM | POA: Diagnosis not present

## 2016-06-20 NOTE — Addendum Note (Signed)
Addendum  created 06/20/16 1350 by Madilyne Tadlock, MD   Sign clinical note    

## 2016-06-24 DIAGNOSIS — M1990 Unspecified osteoarthritis, unspecified site: Secondary | ICD-10-CM | POA: Diagnosis not present

## 2016-06-24 DIAGNOSIS — G35 Multiple sclerosis: Secondary | ICD-10-CM | POA: Diagnosis not present

## 2016-06-24 DIAGNOSIS — K922 Gastrointestinal hemorrhage, unspecified: Secondary | ICD-10-CM | POA: Diagnosis not present

## 2016-06-24 DIAGNOSIS — D62 Acute posthemorrhagic anemia: Secondary | ICD-10-CM | POA: Diagnosis not present

## 2016-06-30 DIAGNOSIS — G35 Multiple sclerosis: Secondary | ICD-10-CM | POA: Diagnosis not present

## 2016-06-30 DIAGNOSIS — R278 Other lack of coordination: Secondary | ICD-10-CM | POA: Diagnosis not present

## 2016-06-30 DIAGNOSIS — K219 Gastro-esophageal reflux disease without esophagitis: Secondary | ICD-10-CM | POA: Diagnosis not present

## 2016-06-30 DIAGNOSIS — E559 Vitamin D deficiency, unspecified: Secondary | ICD-10-CM | POA: Diagnosis not present

## 2016-06-30 DIAGNOSIS — R41841 Cognitive communication deficit: Secondary | ICD-10-CM | POA: Diagnosis not present

## 2016-06-30 DIAGNOSIS — R2689 Other abnormalities of gait and mobility: Secondary | ICD-10-CM | POA: Diagnosis not present

## 2016-07-01 DIAGNOSIS — K219 Gastro-esophageal reflux disease without esophagitis: Secondary | ICD-10-CM | POA: Diagnosis not present

## 2016-07-01 DIAGNOSIS — R278 Other lack of coordination: Secondary | ICD-10-CM | POA: Diagnosis not present

## 2016-07-01 DIAGNOSIS — R41841 Cognitive communication deficit: Secondary | ICD-10-CM | POA: Diagnosis not present

## 2016-07-01 DIAGNOSIS — E559 Vitamin D deficiency, unspecified: Secondary | ICD-10-CM | POA: Diagnosis not present

## 2016-07-01 DIAGNOSIS — G35 Multiple sclerosis: Secondary | ICD-10-CM | POA: Diagnosis not present

## 2016-07-01 DIAGNOSIS — R2689 Other abnormalities of gait and mobility: Secondary | ICD-10-CM | POA: Diagnosis not present

## 2016-07-04 DIAGNOSIS — R2689 Other abnormalities of gait and mobility: Secondary | ICD-10-CM | POA: Diagnosis not present

## 2016-07-04 DIAGNOSIS — M1990 Unspecified osteoarthritis, unspecified site: Secondary | ICD-10-CM | POA: Diagnosis not present

## 2016-07-04 DIAGNOSIS — G35 Multiple sclerosis: Secondary | ICD-10-CM | POA: Diagnosis not present

## 2016-07-04 DIAGNOSIS — R41841 Cognitive communication deficit: Secondary | ICD-10-CM | POA: Diagnosis not present

## 2016-07-04 DIAGNOSIS — K219 Gastro-esophageal reflux disease without esophagitis: Secondary | ICD-10-CM | POA: Diagnosis not present

## 2016-07-04 DIAGNOSIS — R278 Other lack of coordination: Secondary | ICD-10-CM | POA: Diagnosis not present

## 2016-07-04 DIAGNOSIS — Z8719 Personal history of other diseases of the digestive system: Secondary | ICD-10-CM | POA: Diagnosis not present

## 2016-07-04 DIAGNOSIS — E559 Vitamin D deficiency, unspecified: Secondary | ICD-10-CM | POA: Diagnosis not present

## 2016-07-07 DIAGNOSIS — R278 Other lack of coordination: Secondary | ICD-10-CM | POA: Diagnosis not present

## 2016-07-07 DIAGNOSIS — E559 Vitamin D deficiency, unspecified: Secondary | ICD-10-CM | POA: Diagnosis not present

## 2016-07-07 DIAGNOSIS — M1990 Unspecified osteoarthritis, unspecified site: Secondary | ICD-10-CM | POA: Diagnosis not present

## 2016-07-07 DIAGNOSIS — G35 Multiple sclerosis: Secondary | ICD-10-CM | POA: Diagnosis not present

## 2016-07-07 DIAGNOSIS — R2689 Other abnormalities of gait and mobility: Secondary | ICD-10-CM | POA: Diagnosis not present

## 2016-07-07 DIAGNOSIS — R41841 Cognitive communication deficit: Secondary | ICD-10-CM | POA: Diagnosis not present

## 2016-07-13 DIAGNOSIS — G35 Multiple sclerosis: Secondary | ICD-10-CM | POA: Diagnosis not present

## 2016-07-13 DIAGNOSIS — R2689 Other abnormalities of gait and mobility: Secondary | ICD-10-CM | POA: Diagnosis not present

## 2016-07-13 DIAGNOSIS — M1990 Unspecified osteoarthritis, unspecified site: Secondary | ICD-10-CM | POA: Diagnosis not present

## 2016-07-13 DIAGNOSIS — E559 Vitamin D deficiency, unspecified: Secondary | ICD-10-CM | POA: Diagnosis not present

## 2016-07-13 DIAGNOSIS — R41841 Cognitive communication deficit: Secondary | ICD-10-CM | POA: Diagnosis not present

## 2016-07-13 DIAGNOSIS — R278 Other lack of coordination: Secondary | ICD-10-CM | POA: Diagnosis not present

## 2016-07-13 DIAGNOSIS — K922 Gastrointestinal hemorrhage, unspecified: Secondary | ICD-10-CM | POA: Diagnosis not present

## 2016-07-15 DIAGNOSIS — E559 Vitamin D deficiency, unspecified: Secondary | ICD-10-CM | POA: Diagnosis not present

## 2016-07-15 DIAGNOSIS — R278 Other lack of coordination: Secondary | ICD-10-CM | POA: Diagnosis not present

## 2016-07-15 DIAGNOSIS — R41841 Cognitive communication deficit: Secondary | ICD-10-CM | POA: Diagnosis not present

## 2016-07-15 DIAGNOSIS — R2689 Other abnormalities of gait and mobility: Secondary | ICD-10-CM | POA: Diagnosis not present

## 2016-07-15 DIAGNOSIS — G35 Multiple sclerosis: Secondary | ICD-10-CM | POA: Diagnosis not present

## 2016-07-15 DIAGNOSIS — M1990 Unspecified osteoarthritis, unspecified site: Secondary | ICD-10-CM | POA: Diagnosis not present

## 2016-07-19 DIAGNOSIS — M1990 Unspecified osteoarthritis, unspecified site: Secondary | ICD-10-CM | POA: Diagnosis not present

## 2016-07-19 DIAGNOSIS — R41841 Cognitive communication deficit: Secondary | ICD-10-CM | POA: Diagnosis not present

## 2016-07-19 DIAGNOSIS — R2689 Other abnormalities of gait and mobility: Secondary | ICD-10-CM | POA: Diagnosis not present

## 2016-07-19 DIAGNOSIS — E559 Vitamin D deficiency, unspecified: Secondary | ICD-10-CM | POA: Diagnosis not present

## 2016-07-19 DIAGNOSIS — R278 Other lack of coordination: Secondary | ICD-10-CM | POA: Diagnosis not present

## 2016-07-19 DIAGNOSIS — G35 Multiple sclerosis: Secondary | ICD-10-CM | POA: Diagnosis not present

## 2016-07-21 DIAGNOSIS — R2689 Other abnormalities of gait and mobility: Secondary | ICD-10-CM | POA: Diagnosis not present

## 2016-07-21 DIAGNOSIS — R41841 Cognitive communication deficit: Secondary | ICD-10-CM | POA: Diagnosis not present

## 2016-07-21 DIAGNOSIS — M1990 Unspecified osteoarthritis, unspecified site: Secondary | ICD-10-CM | POA: Diagnosis not present

## 2016-07-21 DIAGNOSIS — G35 Multiple sclerosis: Secondary | ICD-10-CM | POA: Diagnosis not present

## 2016-07-21 DIAGNOSIS — R278 Other lack of coordination: Secondary | ICD-10-CM | POA: Diagnosis not present

## 2016-07-21 DIAGNOSIS — E559 Vitamin D deficiency, unspecified: Secondary | ICD-10-CM | POA: Diagnosis not present

## 2016-07-22 DIAGNOSIS — R278 Other lack of coordination: Secondary | ICD-10-CM | POA: Diagnosis not present

## 2016-07-22 DIAGNOSIS — M1990 Unspecified osteoarthritis, unspecified site: Secondary | ICD-10-CM | POA: Diagnosis not present

## 2016-07-22 DIAGNOSIS — R41841 Cognitive communication deficit: Secondary | ICD-10-CM | POA: Diagnosis not present

## 2016-07-22 DIAGNOSIS — G35 Multiple sclerosis: Secondary | ICD-10-CM | POA: Diagnosis not present

## 2016-07-22 DIAGNOSIS — R2689 Other abnormalities of gait and mobility: Secondary | ICD-10-CM | POA: Diagnosis not present

## 2016-07-22 DIAGNOSIS — E559 Vitamin D deficiency, unspecified: Secondary | ICD-10-CM | POA: Diagnosis not present

## 2016-07-25 DIAGNOSIS — E559 Vitamin D deficiency, unspecified: Secondary | ICD-10-CM | POA: Diagnosis not present

## 2016-07-25 DIAGNOSIS — M1990 Unspecified osteoarthritis, unspecified site: Secondary | ICD-10-CM | POA: Diagnosis not present

## 2016-07-25 DIAGNOSIS — G35 Multiple sclerosis: Secondary | ICD-10-CM | POA: Diagnosis not present

## 2016-07-25 DIAGNOSIS — R2689 Other abnormalities of gait and mobility: Secondary | ICD-10-CM | POA: Diagnosis not present

## 2016-07-25 DIAGNOSIS — R41841 Cognitive communication deficit: Secondary | ICD-10-CM | POA: Diagnosis not present

## 2016-07-25 DIAGNOSIS — R278 Other lack of coordination: Secondary | ICD-10-CM | POA: Diagnosis not present

## 2016-07-28 DIAGNOSIS — G35 Multiple sclerosis: Secondary | ICD-10-CM | POA: Diagnosis not present

## 2016-07-28 DIAGNOSIS — E559 Vitamin D deficiency, unspecified: Secondary | ICD-10-CM | POA: Diagnosis not present

## 2016-07-28 DIAGNOSIS — R41841 Cognitive communication deficit: Secondary | ICD-10-CM | POA: Diagnosis not present

## 2016-07-28 DIAGNOSIS — R278 Other lack of coordination: Secondary | ICD-10-CM | POA: Diagnosis not present

## 2016-07-28 DIAGNOSIS — M1990 Unspecified osteoarthritis, unspecified site: Secondary | ICD-10-CM | POA: Diagnosis not present

## 2016-07-28 DIAGNOSIS — R2689 Other abnormalities of gait and mobility: Secondary | ICD-10-CM | POA: Diagnosis not present

## 2016-07-29 DIAGNOSIS — E559 Vitamin D deficiency, unspecified: Secondary | ICD-10-CM | POA: Diagnosis not present

## 2016-07-29 DIAGNOSIS — G35 Multiple sclerosis: Secondary | ICD-10-CM | POA: Diagnosis not present

## 2016-07-29 DIAGNOSIS — R2689 Other abnormalities of gait and mobility: Secondary | ICD-10-CM | POA: Diagnosis not present

## 2016-07-29 DIAGNOSIS — R278 Other lack of coordination: Secondary | ICD-10-CM | POA: Diagnosis not present

## 2016-07-29 DIAGNOSIS — M1990 Unspecified osteoarthritis, unspecified site: Secondary | ICD-10-CM | POA: Diagnosis not present

## 2016-07-29 DIAGNOSIS — R41841 Cognitive communication deficit: Secondary | ICD-10-CM | POA: Diagnosis not present

## 2016-07-31 ENCOUNTER — Other Ambulatory Visit: Payer: Self-pay | Admitting: Neurology

## 2016-08-02 DIAGNOSIS — N318 Other neuromuscular dysfunction of bladder: Secondary | ICD-10-CM | POA: Diagnosis not present

## 2016-08-02 DIAGNOSIS — K922 Gastrointestinal hemorrhage, unspecified: Secondary | ICD-10-CM | POA: Diagnosis not present

## 2016-08-02 DIAGNOSIS — J3089 Other allergic rhinitis: Secondary | ICD-10-CM | POA: Diagnosis not present

## 2016-08-02 DIAGNOSIS — R2689 Other abnormalities of gait and mobility: Secondary | ICD-10-CM | POA: Diagnosis not present

## 2016-08-02 DIAGNOSIS — E559 Vitamin D deficiency, unspecified: Secondary | ICD-10-CM | POA: Diagnosis not present

## 2016-08-02 DIAGNOSIS — M545 Low back pain: Secondary | ICD-10-CM | POA: Diagnosis not present

## 2016-08-02 DIAGNOSIS — R278 Other lack of coordination: Secondary | ICD-10-CM | POA: Diagnosis not present

## 2016-08-02 DIAGNOSIS — G35 Multiple sclerosis: Secondary | ICD-10-CM | POA: Diagnosis not present

## 2016-08-02 DIAGNOSIS — M3210 Systemic lupus erythematosus, organ or system involvement unspecified: Secondary | ICD-10-CM | POA: Diagnosis not present

## 2016-08-02 DIAGNOSIS — R41841 Cognitive communication deficit: Secondary | ICD-10-CM | POA: Diagnosis not present

## 2016-08-02 DIAGNOSIS — M1990 Unspecified osteoarthritis, unspecified site: Secondary | ICD-10-CM | POA: Diagnosis not present

## 2016-08-02 DIAGNOSIS — M810 Age-related osteoporosis without current pathological fracture: Secondary | ICD-10-CM | POA: Diagnosis not present

## 2016-08-04 DIAGNOSIS — R2689 Other abnormalities of gait and mobility: Secondary | ICD-10-CM | POA: Diagnosis not present

## 2016-08-04 DIAGNOSIS — G35 Multiple sclerosis: Secondary | ICD-10-CM | POA: Diagnosis not present

## 2016-08-04 DIAGNOSIS — R278 Other lack of coordination: Secondary | ICD-10-CM | POA: Diagnosis not present

## 2016-08-04 DIAGNOSIS — E559 Vitamin D deficiency, unspecified: Secondary | ICD-10-CM | POA: Diagnosis not present

## 2016-08-04 DIAGNOSIS — M1990 Unspecified osteoarthritis, unspecified site: Secondary | ICD-10-CM | POA: Diagnosis not present

## 2016-08-04 DIAGNOSIS — R41841 Cognitive communication deficit: Secondary | ICD-10-CM | POA: Diagnosis not present

## 2016-08-09 DIAGNOSIS — R2689 Other abnormalities of gait and mobility: Secondary | ICD-10-CM | POA: Diagnosis not present

## 2016-08-09 DIAGNOSIS — R278 Other lack of coordination: Secondary | ICD-10-CM | POA: Diagnosis not present

## 2016-08-09 DIAGNOSIS — M1990 Unspecified osteoarthritis, unspecified site: Secondary | ICD-10-CM | POA: Diagnosis not present

## 2016-08-09 DIAGNOSIS — R41841 Cognitive communication deficit: Secondary | ICD-10-CM | POA: Diagnosis not present

## 2016-08-09 DIAGNOSIS — E559 Vitamin D deficiency, unspecified: Secondary | ICD-10-CM | POA: Diagnosis not present

## 2016-08-09 DIAGNOSIS — G35 Multiple sclerosis: Secondary | ICD-10-CM | POA: Diagnosis not present

## 2016-08-15 DIAGNOSIS — R2689 Other abnormalities of gait and mobility: Secondary | ICD-10-CM | POA: Diagnosis not present

## 2016-08-15 DIAGNOSIS — R278 Other lack of coordination: Secondary | ICD-10-CM | POA: Diagnosis not present

## 2016-08-15 DIAGNOSIS — M1990 Unspecified osteoarthritis, unspecified site: Secondary | ICD-10-CM | POA: Diagnosis not present

## 2016-08-15 DIAGNOSIS — G35 Multiple sclerosis: Secondary | ICD-10-CM | POA: Diagnosis not present

## 2016-08-15 DIAGNOSIS — R41841 Cognitive communication deficit: Secondary | ICD-10-CM | POA: Diagnosis not present

## 2016-08-15 DIAGNOSIS — E559 Vitamin D deficiency, unspecified: Secondary | ICD-10-CM | POA: Diagnosis not present

## 2016-08-17 DIAGNOSIS — E559 Vitamin D deficiency, unspecified: Secondary | ICD-10-CM | POA: Diagnosis not present

## 2016-08-17 DIAGNOSIS — R41841 Cognitive communication deficit: Secondary | ICD-10-CM | POA: Diagnosis not present

## 2016-08-17 DIAGNOSIS — G35 Multiple sclerosis: Secondary | ICD-10-CM | POA: Diagnosis not present

## 2016-08-17 DIAGNOSIS — R278 Other lack of coordination: Secondary | ICD-10-CM | POA: Diagnosis not present

## 2016-08-17 DIAGNOSIS — R2689 Other abnormalities of gait and mobility: Secondary | ICD-10-CM | POA: Diagnosis not present

## 2016-08-17 DIAGNOSIS — M1990 Unspecified osteoarthritis, unspecified site: Secondary | ICD-10-CM | POA: Diagnosis not present

## 2016-08-22 DIAGNOSIS — Z01419 Encounter for gynecological examination (general) (routine) without abnormal findings: Secondary | ICD-10-CM | POA: Diagnosis not present

## 2016-08-22 DIAGNOSIS — N390 Urinary tract infection, site not specified: Secondary | ICD-10-CM | POA: Diagnosis not present

## 2016-08-22 DIAGNOSIS — R350 Frequency of micturition: Secondary | ICD-10-CM | POA: Diagnosis not present

## 2016-08-23 ENCOUNTER — Other Ambulatory Visit: Payer: Self-pay | Admitting: Obstetrics and Gynecology

## 2016-08-23 DIAGNOSIS — N6320 Unspecified lump in the left breast, unspecified quadrant: Secondary | ICD-10-CM

## 2016-08-25 DIAGNOSIS — E559 Vitamin D deficiency, unspecified: Secondary | ICD-10-CM | POA: Diagnosis not present

## 2016-08-25 DIAGNOSIS — M1990 Unspecified osteoarthritis, unspecified site: Secondary | ICD-10-CM | POA: Diagnosis not present

## 2016-08-25 DIAGNOSIS — R2689 Other abnormalities of gait and mobility: Secondary | ICD-10-CM | POA: Diagnosis not present

## 2016-08-25 DIAGNOSIS — R278 Other lack of coordination: Secondary | ICD-10-CM | POA: Diagnosis not present

## 2016-08-25 DIAGNOSIS — R41841 Cognitive communication deficit: Secondary | ICD-10-CM | POA: Diagnosis not present

## 2016-08-25 DIAGNOSIS — G35 Multiple sclerosis: Secondary | ICD-10-CM | POA: Diagnosis not present

## 2016-08-29 ENCOUNTER — Other Ambulatory Visit: Payer: PRIVATE HEALTH INSURANCE

## 2016-09-15 ENCOUNTER — Encounter (HOSPITAL_COMMUNITY): Payer: Self-pay | Admitting: Emergency Medicine

## 2016-09-15 ENCOUNTER — Emergency Department (HOSPITAL_COMMUNITY)
Admission: EM | Admit: 2016-09-15 | Discharge: 2016-09-15 | Disposition: A | Discharge status: Home / Self Care | Payer: Medicare Other | Attending: Emergency Medicine | Admitting: Emergency Medicine

## 2016-09-15 ENCOUNTER — Emergency Department (HOSPITAL_COMMUNITY): Payer: Medicare Other

## 2016-09-15 DIAGNOSIS — R404 Transient alteration of awareness: Secondary | ICD-10-CM | POA: Diagnosis not present

## 2016-09-15 DIAGNOSIS — R531 Weakness: Secondary | ICD-10-CM | POA: Diagnosis not present

## 2016-09-15 DIAGNOSIS — R109 Unspecified abdominal pain: Secondary | ICD-10-CM | POA: Insufficient documentation

## 2016-09-15 DIAGNOSIS — Z79899 Other long term (current) drug therapy: Secondary | ICD-10-CM | POA: Diagnosis not present

## 2016-09-15 DIAGNOSIS — R4182 Altered mental status, unspecified: Secondary | ICD-10-CM | POA: Diagnosis not present

## 2016-09-15 DIAGNOSIS — R350 Frequency of micturition: Secondary | ICD-10-CM | POA: Diagnosis not present

## 2016-09-15 DIAGNOSIS — R3915 Urgency of urination: Secondary | ICD-10-CM | POA: Diagnosis not present

## 2016-09-15 DIAGNOSIS — R319 Hematuria, unspecified: Secondary | ICD-10-CM | POA: Diagnosis not present

## 2016-09-15 DIAGNOSIS — K4090 Unilateral inguinal hernia, without obstruction or gangrene, not specified as recurrent: Secondary | ICD-10-CM | POA: Diagnosis not present

## 2016-09-15 DIAGNOSIS — R32 Unspecified urinary incontinence: Secondary | ICD-10-CM | POA: Diagnosis not present

## 2016-09-15 LAB — CBC
HCT: 32.3 % — ABNORMAL LOW (ref 36.0–46.0)
Hemoglobin: 10.1 g/dL — ABNORMAL LOW (ref 12.0–15.0)
MCH: 24.9 pg — ABNORMAL LOW (ref 26.0–34.0)
MCHC: 31.3 g/dL (ref 30.0–36.0)
MCV: 79.6 fL (ref 78.0–100.0)
PLATELETS: 201 10*3/uL (ref 150–400)
RBC: 4.06 MIL/uL (ref 3.87–5.11)
RDW: 15.2 % (ref 11.5–15.5)
WBC: 11 10*3/uL — AB (ref 4.0–10.5)

## 2016-09-15 LAB — URINALYSIS, ROUTINE W REFLEX MICROSCOPIC
Bilirubin Urine: NEGATIVE
GLUCOSE, UA: NEGATIVE mg/dL
KETONES UR: 20 mg/dL — AB
Leukocytes, UA: NEGATIVE
NITRITE: NEGATIVE
PH: 5 (ref 5.0–8.0)
Protein, ur: 30 mg/dL — AB
Specific Gravity, Urine: 1.023 (ref 1.005–1.030)

## 2016-09-15 LAB — COMPREHENSIVE METABOLIC PANEL
ALT: 30 U/L (ref 14–54)
ANION GAP: 8 (ref 5–15)
AST: 29 U/L (ref 15–41)
Albumin: 3.7 g/dL (ref 3.5–5.0)
Alkaline Phosphatase: 78 U/L (ref 38–126)
BILIRUBIN TOTAL: 0.5 mg/dL (ref 0.3–1.2)
BUN: 31 mg/dL — AB (ref 6–20)
CO2: 22 mmol/L (ref 22–32)
Calcium: 9.1 mg/dL (ref 8.9–10.3)
Chloride: 107 mmol/L (ref 101–111)
Creatinine, Ser: 1.03 mg/dL — ABNORMAL HIGH (ref 0.44–1.00)
GFR, EST NON AFRICAN AMERICAN: 52 mL/min — AB (ref >60)
Glucose, Bld: 106 mg/dL — ABNORMAL HIGH (ref 65–99)
POTASSIUM: 4.2 mmol/L (ref 3.5–5.1)
Sodium: 137 mmol/L (ref 135–145)
TOTAL PROTEIN: 6.8 g/dL (ref 6.5–8.1)

## 2016-09-15 NOTE — ED Notes (Signed)
Patient transported to CT 

## 2016-09-15 NOTE — ED Triage Notes (Signed)
Per EMS- pt transported from home. Family called EMS with concerns for weakness and diagnosed and treated UTI x 3 weeks. Weakness x 1 week. UTI-Tx with sulfa, Macrobid. Pt is alert, oriented appropriate. Ambulatory with walker in the home. New onset of fecal incontinence and diarrhea.

## 2016-09-15 NOTE — ED Triage Notes (Signed)
Pt stated that she felt weak in her legs while up to the bathroom last night. Noted same concern this am, and was incontinent of liquid stool. Sitter at bedside. Sitter was with patient last night. Pt is alert and appropriate. Denies dizziness or LOC. Denies and recent falls

## 2016-09-15 NOTE — ED Provider Notes (Signed)
[facility]-EMERGENCY DEPT Provider Note   CSN: [id number] Arrival date & time: 09/15/16  4782     History   Chief Complaint Chief Complaint  Patient presents with  . Fatigue  . Urinary Tract Infection  . Abdominal Pain    HPI Suzanne Kim is a 76 y.o. female.  HPI Patient reports ongoing urinary frequency over the past 3 weeks despite multiple rounds of antibiotics.  She's been treated withsulfa based medication as well as Macrobid.  She denies fevers and chills.  No nausea or vomiting.  She reports generalized weakness over the past week which she believes is a flare of her multiple sclerosis.  She denies change in appetite.  No back pain or flank pain.  no history of kidney stones.    Past Medical History:  Diagnosis Date  . Multiple sclerosis (HCC) 1988    Patient Active Problem List   Diagnosis Date Noted  . GI bleed 05/11/2016  . Muscle spasm of right lower extremity 10/14/2013  . Overactive bladder 10/14/2013  . Hip pain, bilateral 10/14/2013  . Bilateral low back pain without sciatica 10/14/2013  . Secondary progressive multiple sclerosis (HCC) 04/09/2013    Past Surgical History:  Procedure Laterality Date  . BREAST ENHANCEMENT SURGERY  1975  . ESOPHAGOGASTRODUODENOSCOPY (EGD) WITH PROPOFOL N/A 05/12/2016   Procedure: ESOPHAGOGASTRODUODENOSCOPY (EGD) WITH PROPOFOL;  Surgeon: Bernette Redbird, MD;  Location: [facility] ENDOSCOPY;  Service: Endoscopy;  Laterality: N/A;  . TONSILLECTOMY    . TOTAL VAGINAL HYSTERECTOMY    . WISDOM TOOTH EXTRACTION      OB History    No data available       Home Medications    Prior to Admission medications   Medication Sig Start Date End Date Taking? Authorizing Provider  acetaminophen (TYLENOL) 500 MG tablet Take 1,000 mg by mouth every 6 (six) hours as needed for mild pain, moderate pain, fever or headache.   Yes [provider]  Cholecalciferol (VITAMIN D) 2000 units tablet Take 4,000 Units by mouth daily.   Yes  [provider]  gabapentin (NEURONTIN) 100 MG capsule Take 100-200 mg by mouth 2 (two) times daily. Takes 100 mg in the morning and 200 mg at bedtime   Yes [provider]  meclizine (ANTIVERT) 25 MG tablet Take 25 mg by mouth 2 (two) times daily as needed for dizziness.   Yes [provider]  nitrofurantoin, macrocrystal-monohydrate, (MACROBID) 100 MG capsule Take 100 mg by mouth 2 (two) times daily. Started 08/28 for 7 days   Yes [provider]  omeprazole (PRILOSEC) 20 MG capsule TAKE 1 CAPSULE DAILY 08/01/16  Yes Everlena Cooper, Adam R, DO  raloxifene (EVISTA) 60 MG tablet Take 60 mg by mouth daily.    Yes [provider]    Family History Family History  Problem Relation Age of Onset  . Heart failure Father   . Ataxia Neg Hx   . Chorea Neg Hx   . Dementia Neg Hx   . Mental retardation Neg Hx   . Migraines Neg Hx   . Multiple sclerosis Neg Hx   . Neurofibromatosis Neg Hx   . Neuropathy Neg Hx   . Parkinsonism Neg Hx   . Seizures Neg Hx   . Stroke Neg Hx     Social History Social History  Substance Use Topics  . Smoking status: Never Smoker  . Smokeless tobacco: Never Used  . Alcohol use No     Allergies   Patient has no known  allergies.   Review of Systems Review of Systems  All other systems reviewed and are negative.    Physical Exam Updated Vital Signs BP (!) 113/48   Pulse 89   Temp 99.1 F (37.3 C) (Oral)   Resp (!) 22   SpO2 100%   Physical Exam  Constitutional: She is oriented to person, place, and time. She appears well-developed and well-nourished. No distress.  HENT:  Head: Normocephalic and atraumatic.  Eyes: EOM are normal.  Neck: Normal range of motion.  Cardiovascular: Normal rate, regular rhythm and normal heart sounds.   Pulmonary/Chest: Effort normal and breath sounds normal.  Abdominal: Soft. She exhibits no distension. There is no tenderness.  Musculoskeletal: Normal range of motion.    Neurological: She is alert and oriented to person, place, and time.  Skin: Skin is warm and dry.  Psychiatric: She has a normal mood and affect.  Nursing note and vitals reviewed.    ED Treatments / Results  Labs (all labs ordered are listed, but only abnormal results are displayed) Labs Reviewed  CBC - Abnormal; Notable for the following:       Result Value   WBC 11.0 (*)    Hemoglobin 10.1 (*)    HCT 32.3 (*)    MCH 24.9 (*)    All other components within normal limits  COMPREHENSIVE METABOLIC PANEL - Abnormal; Notable for the following:    Glucose, Bld 106 (*)    BUN 31 (*)    Creatinine, Ser 1.03 (*)    GFR calc non Af Amer 52 (*)    All other components within normal limits  URINALYSIS, ROUTINE W REFLEX MICROSCOPIC - Abnormal; Notable for the following:    Hgb urine dipstick MODERATE (*)    Ketones, ur 20 (*)    Protein, ur 30 (*)    Bacteria, UA RARE (*)    Squamous Epithelial / LPF 0-5 (*)    All other components within normal limits  URINE CULTURE    EKG  EKG Interpretation None       Radiology Ct Renal Stone Study  Result Date: 09/15/2016 CLINICAL DATA:  New onset fecal incontinence and diarrhea. Hematuria. EXAM: CT ABDOMEN AND PELVIS WITHOUT CONTRAST TECHNIQUE: Multidetector CT imaging of the abdomen and pelvis was performed following the standard protocol without IV contrast. COMPARISON:  None. FINDINGS: Lower chest: Mild bibasilar chronic interstitial disease. Trace right pleural effusion. Hepatobiliary: No focal hepatic mass. Punctate calcifications throughout the liver as can be seen with prior granulomatous disease. Normal gallbladder. Pancreas: Unremarkable. No pancreatic ductal dilatation or surrounding inflammatory changes. Spleen: No focal mass. Punctate calcifications throughout the spleen as can be seen with prior granulomatous disease. Adrenals/Urinary Tract: Normal adrenal glands. No obstructive uropathy. No urolithiasis. Course dystrophic  calcification in the anterior interpolar aspect of the right kidney. Normal bladder. Stomach/Bowel: No bowel dilatation or bowel wall thickening. No pneumatosis, pneumoperitoneum or portal venous gas. Normal appendix. Left inguinal hernia containing small bowel without dilatation. Sigmoid diverticulosis without evidence of diverticulitis. Vascular/Lymphatic: Abdominal aortic atherosclerosis. Abdominal aorta is normal in caliber. Reproductive: Prior hysterectomy. Vaginal pessary device is present. Other: No fluid collection or hematoma. Musculoskeletal: No acute osseous abnormality. Degenerative disc disease with disc height loss throughout the lumbar spine. S-shaped scoliosis of the thoracolumbar spine. IMPRESSION: 1. No urolithiasis or obstructive uropathy. 2. Left inguinal hernia containing a loop of small bowel without resulting bowel obstruction. 3.  Aortic Atherosclerosis (ICD10-170.0) Electronically Signed   By: Elige Ko   On:  09/15/2016 14:12    Procedures Procedures (including critical care time)  Medications Ordered in ED Medications - No data to display   Initial Impression / Assessment and Plan / ED Course  I have reviewed the triage vital signs and the nursing notes.  Pertinent labs & imaging results that were available during my care of the patient were reviewed by me and considered in my medical decision making (see chart for details).     No clear etiology for the patient's urinary frequency.  Urine culture sent.  CT stone study demonstrates no ureteral or bladder abnormalities.  Patient will need follow-up as an outpatient with urology.  Her generalized weakness is likely an MS flare.  She states she has adequate help at home to help with this and does not need additional assistance.  She has a neurologist who she will follow-up with.  Final Clinical Impressions(s) / ED Diagnoses   Final diagnoses:  Urinary frequency  Weakness    New Prescriptions Current Discharge  Medication List       Azalia Bilis, MD 09/15/16 1616

## 2016-09-16 DIAGNOSIS — G35 Multiple sclerosis: Secondary | ICD-10-CM | POA: Diagnosis not present

## 2016-09-16 DIAGNOSIS — N8110 Cystocele, unspecified: Secondary | ICD-10-CM | POA: Diagnosis not present

## 2016-09-16 DIAGNOSIS — N319 Neuromuscular dysfunction of bladder, unspecified: Secondary | ICD-10-CM | POA: Diagnosis not present

## 2016-09-16 DIAGNOSIS — N12 Tubulo-interstitial nephritis, not specified as acute or chronic: Secondary | ICD-10-CM | POA: Diagnosis not present

## 2016-09-16 DIAGNOSIS — G92 Toxic encephalopathy: Secondary | ICD-10-CM | POA: Diagnosis not present

## 2016-09-16 DIAGNOSIS — M4120 Other idiopathic scoliosis, site unspecified: Secondary | ICD-10-CM | POA: Diagnosis not present

## 2016-09-16 DIAGNOSIS — R295 Transient paralysis: Secondary | ICD-10-CM | POA: Diagnosis not present

## 2016-09-16 DIAGNOSIS — N3281 Overactive bladder: Secondary | ICD-10-CM | POA: Diagnosis not present

## 2016-09-16 LAB — URINE CULTURE: Culture: NO GROWTH

## 2016-11-07 DIAGNOSIS — Z23 Encounter for immunization: Secondary | ICD-10-CM | POA: Diagnosis not present

## 2016-11-07 DIAGNOSIS — K5900 Constipation, unspecified: Secondary | ICD-10-CM | POA: Diagnosis not present

## 2016-11-07 DIAGNOSIS — R109 Unspecified abdominal pain: Secondary | ICD-10-CM | POA: Diagnosis not present

## 2016-11-07 DIAGNOSIS — M4120 Other idiopathic scoliosis, site unspecified: Secondary | ICD-10-CM | POA: Diagnosis not present

## 2016-11-07 DIAGNOSIS — M4000 Postural kyphosis, site unspecified: Secondary | ICD-10-CM | POA: Diagnosis not present

## 2016-11-07 DIAGNOSIS — R399 Unspecified symptoms and signs involving the genitourinary system: Secondary | ICD-10-CM | POA: Diagnosis not present

## 2016-11-08 DIAGNOSIS — R3915 Urgency of urination: Secondary | ICD-10-CM | POA: Diagnosis not present

## 2017-02-13 DIAGNOSIS — R030 Elevated blood-pressure reading, without diagnosis of hypertension: Secondary | ICD-10-CM | POA: Diagnosis not present

## 2017-02-13 DIAGNOSIS — G35 Multiple sclerosis: Secondary | ICD-10-CM | POA: Diagnosis not present

## 2017-02-13 DIAGNOSIS — F419 Anxiety disorder, unspecified: Secondary | ICD-10-CM | POA: Diagnosis not present

## 2017-02-13 DIAGNOSIS — F40298 Other specified phobia: Secondary | ICD-10-CM | POA: Diagnosis not present

## 2017-02-13 DIAGNOSIS — R5383 Other fatigue: Secondary | ICD-10-CM | POA: Diagnosis not present

## 2017-02-21 DIAGNOSIS — Z79899 Other long term (current) drug therapy: Secondary | ICD-10-CM | POA: Diagnosis not present

## 2017-03-27 DIAGNOSIS — R300 Dysuria: Secondary | ICD-10-CM | POA: Diagnosis not present

## 2017-03-27 DIAGNOSIS — R5383 Other fatigue: Secondary | ICD-10-CM | POA: Diagnosis not present

## 2017-03-27 DIAGNOSIS — N810 Urethrocele: Secondary | ICD-10-CM | POA: Diagnosis not present

## 2017-03-27 DIAGNOSIS — I158 Other secondary hypertension: Secondary | ICD-10-CM | POA: Diagnosis not present

## 2017-03-27 DIAGNOSIS — F4322 Adjustment disorder with anxiety: Secondary | ICD-10-CM | POA: Diagnosis not present

## 2017-03-27 DIAGNOSIS — G8929 Other chronic pain: Secondary | ICD-10-CM | POA: Diagnosis not present

## 2017-03-27 DIAGNOSIS — H8110 Benign paroxysmal vertigo, unspecified ear: Secondary | ICD-10-CM | POA: Diagnosis not present

## 2017-03-27 DIAGNOSIS — G35 Multiple sclerosis: Secondary | ICD-10-CM | POA: Diagnosis not present

## 2017-04-12 ENCOUNTER — Ambulatory Visit (INDEPENDENT_AMBULATORY_CARE_PROVIDER_SITE_OTHER): Payer: Medicare Other | Admitting: Neurology

## 2017-04-12 ENCOUNTER — Encounter: Payer: Self-pay | Admitting: Psychology

## 2017-04-12 ENCOUNTER — Encounter: Payer: Self-pay | Admitting: Neurology

## 2017-04-12 VITALS — BP 116/68 | HR 79 | Resp 16 | Ht 62.0 in | Wt 115.0 lb

## 2017-04-12 DIAGNOSIS — G35 Multiple sclerosis: Secondary | ICD-10-CM

## 2017-04-12 DIAGNOSIS — E559 Vitamin D deficiency, unspecified: Secondary | ICD-10-CM | POA: Diagnosis not present

## 2017-04-12 NOTE — Patient Instructions (Addendum)
1.  Restart D3 4000 IU daily.  I want to check a vitamin D level in 6 months. Your provider has requested that you have labwork completed  In 6 months . Please go to [facility] ([address]) on the second floor of this building before leaving the office today. You do not need to check in. If you are not called within 15 minutes please check with the front desk.  2.  Follow up in one year.  Once the new medication for secondary progressive MS is available, I will have you follow up with me sooner to discuss further.  It should be out in the next month or two.

## 2017-04-12 NOTE — Progress Notes (Signed)
NEUROLOGY FOLLOW UP OFFICE NOTE  Suzanne Kim [id number]  HISTORY OF PRESENT ILLNESS: Suzanne Kim is a 77 year old right-handed woman with history of osteoporosis and multiple sclerosis, who follows up for secondary progressive multiple sclerosis.  She is accompanied by her neighbor who supplements history.   UPDATE: She was hospitalized last year for upper GI bleed, which caused deconditioning.  Since January, she felt progressively weaker, causing more difficulty performing her home exercises.  She has an aide 3 hours a day who helps bathe her.  She reports worsening vision.  She has overactive bladder unchanged.  She has chronic back pain unchanged.  She reports some depression.  Her 47 year old mother was recently diagnosed with brain cancer.  She stopped Prozac because it made her feel weaker.  She reports no cognitive deficits.    She is taking gabapentin.  She was told by her PCP to stop vitamin D because it was not needed.   HISTORY: Onset occurred at age 23.  She describes her initial symptoms as a "gray cloud slowly cover(ing) my vision horizontally".  She would experience these episodes occasionally over the next several years.  In 1986, she had an attack described as numbness from her chest down, as well as weakness in the lower extremities.  At this time, she was evaluated by two neurologists, Dr. Ninetta Lights and Dr. Sandria Manly.  She was told that she did not have MS based on the MRI alone.  The following year, she began stubbing her right toe often.  This progressed to increased difficulty walking, mostly in her right leg, as well as right foot drop.  In 1988, she then saw another neurologist, Dr. Dorina Hoyer, who told her he thought she did have MS, but did not recommend invasive testing, such as an LP. She was never started on an immunomodulatory agent.  She was prescribed Symatril for fatigue and followed up with Dr. Freida Busman until 1991, when her care was transferred to Dr. Anne Hahn.   Various blood work was ordered, which revealed an elevated ANA of 1280.  She [facility]  She underwent multiple tests, including MRI, CT and blood work, and was formally diagnosed with lupus.    She was treated for lupus with Plaquenil from 1991 to 1999.  As her walking continued to progressively get worse, she was Dr. Elwyn Lade at the [facility].  He suspected primary progressive MS. She did finally undergo an LP, as well as MRI.  MRI of the brain and cervical spine from 08/31/92 was unremarkable and did not show areas of signal abnormality to suggest demyelinating disease.  Results were "inconclusive" but still diagnosed her with primary progressive MS.  He offered to try Copaxone, but since it is not FDA-approved for PPMS, she would have to pay out of pocket, so she declined.  Regarding the diagnosis of lupus, she was evaluated by two other rheumatologists in [place], who doubted the diagnosis and workup reportedly did not suggest a connective tissue disease.  She eventually had been under the care of Dr. Leotis Shames.  She reportedly had VEPs which were positive for optic neuropathy.   She has had baseline weakness involving the lower extremities.  She has assisted devices at home, including a stair lift.  She wears a brace on the right ankle due to a foot drop.   She is fearful about taking a shower, particularly due to the right leg weakness.  She  has been washing herself with a washcloth at the sink.  She ambulates with a walker in the house.  She denies fatigue and actually has trouble falling asleep at night, which contributes to tiredness during the day.  She has overactive bladder.  She has history of back pain and degenerative disc disease, as well as osteoarthritis of the hips.  MRI of lumbar spine with and without contrast was performed on 06/21/10, which revealed mild degenerative changes of the discs and facet joints, but no central or neural foraminal  stenosis.  She has been treated for pain in the past with lumbar epidural steroid injections, which were ineffective.  Celebrex and hydrocodone have been ineffective.    Prior medications include:  Baclofen (for muscle spasms, ineffective), cyclobenzaprine 10mg , tizanidine (ineffective and causes grogginess), Symatril (for fatigue)  PAST MEDICAL HISTORY: Past Medical History:  Diagnosis Date  . Hiatal hernia   . Multiple sclerosis (HCC) 1988    MEDICATIONS: Current Outpatient Medications on File Prior to Visit  Medication Sig Dispense Refill  . acetaminophen (TYLENOL) 500 MG tablet Take 1,000 mg by mouth every 6 (six) hours as needed for mild pain, moderate pain, fever or headache.    . Cholecalciferol (VITAMIN D) 2000 units tablet Take 4,000 Units by mouth daily.    . [employer] COENZYME Q-10 100 MG capsule Take 100 mg by mouth daily.  3  . gabapentin (NEURONTIN) 100 MG capsule Take 100-200 mg by mouth 2 (two) times daily. Takes 100 mg in the morning and 200 mg at bedtime    . Garlic Oil 500 MG CAPS Take 1 capsule by mouth daily.  2  . meclizine (ANTIVERT) 25 MG tablet Take 25 mg by mouth 2 (two) times daily as needed for dizziness.    Marland Kitchen omeprazole (PRILOSEC) 20 MG capsule TAKE 1 CAPSULE DAILY 90 capsule 1  . raloxifene (EVISTA) 60 MG tablet Take 60 mg by mouth daily.     . vitamin B-12 (CYANOCOBALAMIN) 100 MCG tablet Take 100 mcg by mouth daily.  2  . FLUoxetine (PROZAC) 10 MG capsule Take 10 mg by mouth at bedtime.  1   No current facility-administered medications on file prior to visit.     ALLERGIES: No Known Allergies  FAMILY HISTORY: Family History  Problem Relation Age of Onset  . Heart failure Father   . Ataxia Neg Hx   . Chorea Neg Hx   . Dementia Neg Hx   . Mental retardation Neg Hx   . Migraines Neg Hx   . Multiple sclerosis Neg Hx   . Neurofibromatosis Neg Hx   . Neuropathy Neg Hx   . Parkinsonism Neg Hx   . Seizures Neg Hx   . Stroke Neg Hx     SOCIAL  HISTORY: Social History   Socioeconomic History  . Marital status: Married    Spouse name: Not on file  . Number of children: Not on file  . Years of education: Not on file  . Highest education level: Not on file  Occupational History  . Not on file  Social Needs  . Financial resource strain: Not on file  . Food insecurity:    Worry: Not on file    Inability: Not on file  . Transportation needs:    Medical: Not on file    Non-medical: Not on file  Tobacco Use  . Smoking status: Never Smoker  . Smokeless tobacco: Never Used  Substance and Sexual Activity  . Alcohol use: No  Alcohol/week: 0.0 oz  . Drug use: No  . Sexual activity: Never  Lifestyle  . Physical activity:    Days per week: Not on file    Minutes per session: Not on file  . Stress: Not on file  Relationships  . Social connections:    Talks on phone: Not on file    Gets together: Not on file    Attends religious service: Not on file    Active member of club or organization: Not on file    Attends meetings of clubs or organizations: Not on file    Relationship status: Not on file  . Intimate partner violence:    Fear of current or ex partner: Not on file    Emotionally abused: Not on file    Physically abused: Not on file    Forced sexual activity: Not on file  Other Topics Concern  . Not on file  Social History Narrative  . Not on file    REVIEW OF SYSTEMS: Constitutional: No fevers, chills, or sweats, no generalized fatigue, change in appetite Eyes: blurred vision Ear, nose and throat: No hearing loss, ear pain, nasal congestion, sore throat Cardiovascular: No chest pain, palpitations Respiratory:  No shortness of breath at rest or with exertion, wheezes GastrointestinaI: No nausea, vomiting, diarrhea, abdominal pain, fecal incontinence Genitourinary:  Overactive bladder Musculoskeletal:  Neck pain Integumentary: No rash, pruritus, skin lesions Neurological: as above Psychiatric: some  depression Endocrine: No palpitations, fatigue, diaphoresis, mood swings, change in appetite, change in weight, increased thirst Hematologic/Lymphatic:  No purpura, petechiae. Allergic/Immunologic: no itchy/runny eyes, nasal congestion, recent allergic reactions, rashes  PHYSICAL EXAM: Vitals:   04/12/17 1423  BP: 116/68  Pulse: 79  Resp: 16  SpO2: 90%   General: No acute distress.   Head:  Normocephalic/atraumatic Eyes:  Fundi examined but not visualized Neck: supple, no paraspinal tenderness, full range of motion Heart:  Regular rate and rhythm Lungs:  Clear to auscultation bilaterally Back: No paraspinal tenderness Neurological Exam: alert and oriented to person, place, and time. Attention span and concentration intact, recent and remote memory intact, fund of knowledge intact.  Speech fluent and not dysarthric, language intact.  CN II-XII intact. Fundi not visualized.  Bulk and tone normal, muscle strength 3+/5 right hip flexion, 4/5 right knee extension, 3/5 right knee flexion, right foot drop, 4-/5 left hip flexion, 5-/5 left knee flexion/extension and ankle dorsiflexion. 5/5 in upper extremities.  Deep tendon reflexes 2+ in upper extremities and 3+ in lower extremities.  Finger to nose intact.  In wheelchair. Requires assistance to stand up.  IMPRESSION: Secondary progressive MS  PLAN: 1.  Recommend restarting D3 4000 IU daily for neuroprotection and myelin repair.  Recheck D level in 6 months. 2.  I had our CSW, Shanda Bumps, come in to speak with her regarding available resources. 3.  She is interested in siponimod, the new MS medication indicated for SPPMS.  I told her it is planned to be available in the next month or two.  When available, I will have her return to discuss further. 4.  Otherwise, she will follow up in one year.  25 minutes spent face to face with patient, over 50% spent discussing management.  Shon Millet, DO

## 2017-04-12 NOTE — Progress Notes (Signed)
Met with the patient and her neighbor today when they were in the clinic.  We talked about resources for progressive MS.  We talked about getting in contact with a MS navigator through the [facility].  I will make sure that the patient has the number to call to get connected to more resources/programs.  In addition, the patient could benefit from an evaluation from independent living services through vocational rehabilitation for bathroom modifications.  We also discussed that the patient talk with her care provider that comes into the home for 3 hours a day that she private pays for.  The patient expressed needing more assistance with her sponge bathing which is a reasonable request to talk with her care provider to help her with.  I will email the patient resources on NMSS and independent living services from vocational rehabilitation.  In the future, I would be more than happy to assist the patient with any questions that she has related to resources/ needs due to her MS.

## 2017-04-24 DIAGNOSIS — G35 Multiple sclerosis: Secondary | ICD-10-CM | POA: Diagnosis not present

## 2017-04-24 DIAGNOSIS — H8101 Meniere's disease, right ear: Secondary | ICD-10-CM | POA: Diagnosis not present

## 2017-04-24 DIAGNOSIS — B372 Candidiasis of skin and nail: Secondary | ICD-10-CM | POA: Diagnosis not present

## 2017-04-24 DIAGNOSIS — R300 Dysuria: Secondary | ICD-10-CM | POA: Diagnosis not present

## 2017-04-24 DIAGNOSIS — R3981 Functional urinary incontinence: Secondary | ICD-10-CM | POA: Diagnosis not present

## 2017-04-24 DIAGNOSIS — N810 Urethrocele: Secondary | ICD-10-CM | POA: Diagnosis not present

## 2017-04-24 DIAGNOSIS — I10 Essential (primary) hypertension: Secondary | ICD-10-CM | POA: Diagnosis not present

## 2017-04-25 DIAGNOSIS — R3915 Urgency of urination: Secondary | ICD-10-CM | POA: Diagnosis not present

## 2017-04-25 DIAGNOSIS — R350 Frequency of micturition: Secondary | ICD-10-CM | POA: Diagnosis not present

## 2017-05-01 DIAGNOSIS — R531 Weakness: Secondary | ICD-10-CM | POA: Diagnosis not present

## 2017-05-04 DIAGNOSIS — G35 Multiple sclerosis: Secondary | ICD-10-CM

## 2017-05-04 NOTE — Progress Notes (Signed)
Rcvd a call from [place], [place] from [facility] [phone]. She is to fax notes from PA visit this morning indicating P/T need. Kathie Rhodes asked I put in order for P/T for strength training, increased weakness for [facility].  Sent community message to [employer].

## 2017-05-09 ENCOUNTER — Telehealth: Payer: Self-pay | Admitting: Neurology

## 2017-05-09 NOTE — Telephone Encounter (Signed)
Heather called and said that patient is now okay with whoever the Doctor recommends her to see. She said [facility] will Proceed and see her toward the end of the week. Thanks

## 2017-05-12 DIAGNOSIS — M810 Age-related osteoporosis without current pathological fracture: Secondary | ICD-10-CM | POA: Diagnosis not present

## 2017-05-12 DIAGNOSIS — K449 Diaphragmatic hernia without obstruction or gangrene: Secondary | ICD-10-CM | POA: Diagnosis not present

## 2017-05-12 DIAGNOSIS — M160 Bilateral primary osteoarthritis of hip: Secondary | ICD-10-CM | POA: Diagnosis not present

## 2017-05-12 DIAGNOSIS — H468 Other optic neuritis: Secondary | ICD-10-CM | POA: Diagnosis not present

## 2017-05-12 DIAGNOSIS — E559 Vitamin D deficiency, unspecified: Secondary | ICD-10-CM | POA: Diagnosis not present

## 2017-05-12 DIAGNOSIS — M21371 Foot drop, right foot: Secondary | ICD-10-CM | POA: Diagnosis not present

## 2017-05-12 DIAGNOSIS — G35 Multiple sclerosis: Secondary | ICD-10-CM | POA: Diagnosis not present

## 2017-05-12 DIAGNOSIS — N3281 Overactive bladder: Secondary | ICD-10-CM | POA: Diagnosis not present

## 2017-05-12 DIAGNOSIS — M62838 Other muscle spasm: Secondary | ICD-10-CM | POA: Diagnosis not present

## 2017-05-12 DIAGNOSIS — Z79899 Other long term (current) drug therapy: Secondary | ICD-10-CM | POA: Diagnosis not present

## 2017-05-17 DIAGNOSIS — M160 Bilateral primary osteoarthritis of hip: Secondary | ICD-10-CM | POA: Diagnosis not present

## 2017-05-17 DIAGNOSIS — H468 Other optic neuritis: Secondary | ICD-10-CM | POA: Diagnosis not present

## 2017-05-17 DIAGNOSIS — M62838 Other muscle spasm: Secondary | ICD-10-CM | POA: Diagnosis not present

## 2017-05-17 DIAGNOSIS — G35 Multiple sclerosis: Secondary | ICD-10-CM | POA: Diagnosis not present

## 2017-05-17 DIAGNOSIS — M21371 Foot drop, right foot: Secondary | ICD-10-CM | POA: Diagnosis not present

## 2017-05-17 DIAGNOSIS — M810 Age-related osteoporosis without current pathological fracture: Secondary | ICD-10-CM | POA: Diagnosis not present

## 2017-05-18 DIAGNOSIS — G35 Multiple sclerosis: Secondary | ICD-10-CM | POA: Diagnosis not present

## 2017-05-18 DIAGNOSIS — M160 Bilateral primary osteoarthritis of hip: Secondary | ICD-10-CM | POA: Diagnosis not present

## 2017-05-18 DIAGNOSIS — M21371 Foot drop, right foot: Secondary | ICD-10-CM | POA: Diagnosis not present

## 2017-05-18 DIAGNOSIS — M810 Age-related osteoporosis without current pathological fracture: Secondary | ICD-10-CM | POA: Diagnosis not present

## 2017-05-18 DIAGNOSIS — H468 Other optic neuritis: Secondary | ICD-10-CM | POA: Diagnosis not present

## 2017-05-18 DIAGNOSIS — M62838 Other muscle spasm: Secondary | ICD-10-CM | POA: Diagnosis not present

## 2017-05-19 DIAGNOSIS — M62838 Other muscle spasm: Secondary | ICD-10-CM | POA: Diagnosis not present

## 2017-05-19 DIAGNOSIS — M21371 Foot drop, right foot: Secondary | ICD-10-CM | POA: Diagnosis not present

## 2017-05-19 DIAGNOSIS — M160 Bilateral primary osteoarthritis of hip: Secondary | ICD-10-CM | POA: Diagnosis not present

## 2017-05-19 DIAGNOSIS — G35 Multiple sclerosis: Secondary | ICD-10-CM | POA: Diagnosis not present

## 2017-05-19 DIAGNOSIS — M810 Age-related osteoporosis without current pathological fracture: Secondary | ICD-10-CM | POA: Diagnosis not present

## 2017-05-19 DIAGNOSIS — H468 Other optic neuritis: Secondary | ICD-10-CM | POA: Diagnosis not present

## 2017-05-23 DIAGNOSIS — G35 Multiple sclerosis: Secondary | ICD-10-CM | POA: Diagnosis not present

## 2017-05-23 DIAGNOSIS — M160 Bilateral primary osteoarthritis of hip: Secondary | ICD-10-CM | POA: Diagnosis not present

## 2017-05-23 DIAGNOSIS — M62838 Other muscle spasm: Secondary | ICD-10-CM | POA: Diagnosis not present

## 2017-05-23 DIAGNOSIS — M810 Age-related osteoporosis without current pathological fracture: Secondary | ICD-10-CM | POA: Diagnosis not present

## 2017-05-23 DIAGNOSIS — M21371 Foot drop, right foot: Secondary | ICD-10-CM | POA: Diagnosis not present

## 2017-05-23 DIAGNOSIS — H468 Other optic neuritis: Secondary | ICD-10-CM | POA: Diagnosis not present

## 2017-05-24 DIAGNOSIS — N819 Female genital prolapse, unspecified: Secondary | ICD-10-CM | POA: Diagnosis not present

## 2017-05-30 DIAGNOSIS — M21371 Foot drop, right foot: Secondary | ICD-10-CM | POA: Diagnosis not present

## 2017-05-30 DIAGNOSIS — M62838 Other muscle spasm: Secondary | ICD-10-CM | POA: Diagnosis not present

## 2017-05-30 DIAGNOSIS — G35 Multiple sclerosis: Secondary | ICD-10-CM | POA: Diagnosis not present

## 2017-05-30 DIAGNOSIS — M810 Age-related osteoporosis without current pathological fracture: Secondary | ICD-10-CM | POA: Diagnosis not present

## 2017-05-30 DIAGNOSIS — M160 Bilateral primary osteoarthritis of hip: Secondary | ICD-10-CM | POA: Diagnosis not present

## 2017-05-30 DIAGNOSIS — H468 Other optic neuritis: Secondary | ICD-10-CM | POA: Diagnosis not present

## 2017-06-01 ENCOUNTER — Telehealth: Payer: Self-pay | Admitting: Licensed Clinical Social Worker

## 2017-06-01 DIAGNOSIS — M21371 Foot drop, right foot: Secondary | ICD-10-CM | POA: Diagnosis not present

## 2017-06-01 DIAGNOSIS — M62838 Other muscle spasm: Secondary | ICD-10-CM | POA: Diagnosis not present

## 2017-06-01 DIAGNOSIS — G35 Multiple sclerosis: Secondary | ICD-10-CM | POA: Diagnosis not present

## 2017-06-01 DIAGNOSIS — M810 Age-related osteoporosis without current pathological fracture: Secondary | ICD-10-CM | POA: Diagnosis not present

## 2017-06-01 DIAGNOSIS — M160 Bilateral primary osteoarthritis of hip: Secondary | ICD-10-CM | POA: Diagnosis not present

## 2017-06-01 DIAGNOSIS — H468 Other optic neuritis: Secondary | ICD-10-CM | POA: Diagnosis not present

## 2017-06-01 NOTE — Telephone Encounter (Signed)
Palliative Care SW spoke with patient and set up a home visit for Wednesday, 06/07/17, at 1:30pm.

## 2017-06-07 ENCOUNTER — Other Ambulatory Visit: Payer: Medicare Other | Admitting: *Deleted

## 2017-06-07 ENCOUNTER — Other Ambulatory Visit: Payer: Medicare Other | Admitting: Licensed Clinical Social Worker

## 2017-06-07 DIAGNOSIS — Z515 Encounter for palliative care: Secondary | ICD-10-CM

## 2017-06-07 NOTE — Progress Notes (Signed)
COMMUNITY PALLIATIVE CARE SW NOTE  PATIENT NAME: Suzanne Kim DOB: 1940/06/29 MRN: [id number]  PRIMARY CARE PROVIDER: [facility]  RESPONSIBLE PARTY:  Acct ID - Guarantor Home Phone Work Phone Relationship Acct Type  [id number] [name]  Self P/F     [address], [place], [place] [address]     PLAN OF CARE and INTERVENTIONS:             1. GOALS OF CARE/ ADVANCE CARE PLANNING:  Patient wishes to remain in her home as long as possible.  She has a DNR and MOST. 2. SOCIAL/EMOTIONAL/SPIRITUAL ASSESSMENT/ INTERVENTIONS:  Patient is a 77 y/o caucasian female who lives alone in her home.  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  She does not have any children and did not speak about a husband, although all records indicate she is married.  She has no spiritual or religious connections.  SW and Palliative Care RN, Daryl Eastern, met with patient and her neighbor, Venetia Night.  They have been neighbors for over 30 years and Venetia Night assists patient with ADLs.  Patient stated she fell about two weeks ago and that her weakness has increased.  She can not longer use her walker to walk in her home.  Patient's mother is 28 y/o and lives in [place].  She was diagnosed with brain cancer last January.  She expressed difficulty with adjusting to decreased independence.  SW provided active listening and supportive counseling. 3. PATIENT/CAREGIVER EDUCATION/ COPING:  Patient copes by expressing her feelings openly.  She is very regimented  SW and RN provided extensive education regarding nursing home placement per patient's request.  She said she understood. 4. PERSONAL EMERGENCY PLAN:  Patient has a life alert button. 5. COMMUNITY RESOURCES COORDINATION/ HEALTH CARE NAVIGATION:  Patient currently has caregivers from [facility] from 10:30am to 1:30pm.  At 5pm, her neighbor provides dinner, then returns at bedtime to assist with  changing. 6. FINANCIAL/LEGAL CONCERNS/INTERVENTIONS:  None per patient.     SOCIAL HX:  Social History   Tobacco Use  . Smoking status: Never Smoker  . Smokeless tobacco: Never Used  Substance Use Topics  . Alcohol use: No    Alcohol/week: 0.0 oz    CODE STATUS: DNR  ADVANCED DIRECTIVES: Patient's sister is her POA. MOST FORM COMPLETE:  Yes HOSPICE EDUCATION PROVIDED:  Provided education regarding eligibility.  PPS:  Patient's intake is normal.  Patient can stand with assistance.  She can no longer walk. Duration of visit and documentation:  90 minutes.      Creola Corn Macall Mccroskey, LCSW

## 2017-06-08 ENCOUNTER — Encounter: Payer: Self-pay | Admitting: *Deleted

## 2017-06-08 NOTE — Progress Notes (Signed)
COMMUNITY PALLIATIVE CARE RN NOTE  PATIENT NAME: Suzanne Kim DOB: 1940/01/24 MRN: [id number]  PRIMARY CARE PROVIDER: [facility]  RESPONSIBLE PARTY:  Acct ID - Guarantor Home Phone Work Phone Relationship Acct Type  [id number] - Halterman,DI* [phone]  Self P/F     [address], [place], [place] [address]   PLAN OF CARE and INTERVENTION:  1. ADVANCE CARE PLANNING/GOALS OF CARE: Avoid hospitalizations, remain at home with hired caregivers for as long as possible 2. PATIENT/CAREGIVER EDUCATION: Explained Palliative Care Services, Reinforced Safety/Fall Precautions 3. DISEASE STATUS: Joint visit made with Palliative Care SW Larita Fife Duffy. Upon arrival, patient sitting in her recliner awake and alert. Next door neighbor Loistine Simas present during visit. Patient has Hired Caregivers every day from 10:30a to 1:30p, then [place] or her daughter come around 5:30p to fix patient dinner and assist patient in getting ready for the night. Patient sleeps in her recliner and typically sleeps well. However, last pm patient reports having difficulty sleeping as she is worried about how she is going to be able to stay at home if she continues to get worse. Patient had a fall about 2 weeks ago where her legs gave out and she was found by her neighbor lying on the floor in supine position with walker lying on top of her. Patient has a Medical alert pendant so EMS came and assisted patient back into her recliner. No apparent injuries. Patient feels that the fall is a result of her worsening MS. Since this incident patient is much less mobile. Patient states that she used to be able to stand up using her lift chair and transfer herself into her transport wheelchair by taking a few steps. When transported to the bathroom, patient used to be able to ambulate to have wheelchair left by the bathroom door and ambulate 4-56ft to the commode, but is now only able to stand and pivot and no longer able to take any  steps. Patient has worked with PT in the past, however states that the exercises involving her legs pulls on her back making it sore. Lifting also makes her back hurt as well. Also states that her R foot feels like a "concrete block" is attached and she is unable to move it. Requires 1 person assistance with all ADLs, but remains able to feed herself. Denies need for any additional DME. Fair intake. 3 meals a day of small portion sizes with snacks in between. Does not drink nutritional supplements as patient states it causes diarrhea. Incontinent of bladder. LBM this am. Reviewed medications. No difficulty swallowing reported. Patient wanting information regarding the process of moving into a SNF when necessary, which was provided by SW.     HISTORY OF PRESENT ILLNESS: This is a 77 yo female who lives at home with the assistance of hired caregivers and her neighbor. Continues to be followed by Palliative Care to assess overall patient condition and assist with any symptom management needs. Next visit scheduled in 1 month.  CODE STATUS: DNR ADVANCED DIRECTIVES: Y MOST FORM: yes PPS: 30%   PHYSICAL EXAM:   VITALS: Today's Vitals   06/07/17 1349  BP: 116/70  Pulse: 80  Resp: 16  SpO2: 98%  PainSc: 0-No pain    LUNGS: clear to auscultation  CARDIAC: Cor RRR EXTREMITIES: No edema SKIN: Skin color normal, exposed skin intact  NEURO: Alert and oriented x 3, pleasant and engaging, weak w/less mobility   (Duration of visit and documentation 90 minutes)    Kahne Helfand  Dimas Aguas, Charity fundraiser, BSN

## 2017-06-23 DIAGNOSIS — Z79899 Other long term (current) drug therapy: Secondary | ICD-10-CM | POA: Diagnosis not present

## 2017-06-23 DIAGNOSIS — H8101 Meniere's disease, right ear: Secondary | ICD-10-CM | POA: Diagnosis not present

## 2017-06-23 DIAGNOSIS — G35 Multiple sclerosis: Secondary | ICD-10-CM | POA: Diagnosis not present

## 2017-06-23 DIAGNOSIS — I10 Essential (primary) hypertension: Secondary | ICD-10-CM | POA: Diagnosis not present

## 2017-06-23 DIAGNOSIS — R05 Cough: Secondary | ICD-10-CM | POA: Diagnosis not present

## 2017-07-03 DIAGNOSIS — G35 Multiple sclerosis: Secondary | ICD-10-CM | POA: Diagnosis not present

## 2017-07-03 DIAGNOSIS — Z7409 Other reduced mobility: Secondary | ICD-10-CM | POA: Diagnosis not present

## 2017-07-03 DIAGNOSIS — J069 Acute upper respiratory infection, unspecified: Secondary | ICD-10-CM | POA: Diagnosis not present

## 2017-07-05 ENCOUNTER — Other Ambulatory Visit: Payer: Medicare Other | Admitting: Licensed Clinical Social Worker

## 2017-07-05 ENCOUNTER — Other Ambulatory Visit: Payer: Medicare Other | Admitting: *Deleted

## 2017-07-05 DIAGNOSIS — Z515 Encounter for palliative care: Secondary | ICD-10-CM

## 2017-07-05 NOTE — Progress Notes (Unsigned)
COMMUNITY PALLIATIVE CARE SW NOTE  PATIENT NAME: Suzanne Kim DOB: Jan 25, 1940 MRN: [id number]  PRIMARY CARE PROVIDER: [facility]  RESPONSIBLE PARTY:  Acct ID - Guarantor Home Phone Work Phone Relationship Acct Type  [id number] [name]  Self P/F     [address], [place], [place] [address]     PLAN OF CARE and INTERVENTIONS:             1. GOALS OF CARE/ ADVANCE CARE PLANNING:  Patient wishes to remain in her home.  Patient has a DNR and MOST form. 2. SOCIAL/EMOTIONAL/SPIRITUAL ASSESSMENT/ INTERVENTIONS:  SW and Palliative Care RN, Daryl Eastern, met with patient and her neighbor, Venetia Night.  Celebrated patient's birthday.  She expressed appreciation for the visit.  She denied pain.  SW provided active listening and supportive counseling  She expressed enjoying dogs through her life. 3. PATIENT/CAREGIVER EDUCATION/ COPING:  Patient copes by expressing her feelings openly.  She likes to be educated on her options for future care.  Her caregiver encourages patient to also express her fears and worries about future care. 4. PERSONAL EMERGENCY PLAN:  Patient has a Building services engineer. 5. COMMUNITY RESOURCES COORDINATION/ HEALTH CARE NAVIGATION:  Patient continues to have a caregiver from 10:30am to 1:30 pm. 6. FINANCIAL/LEGAL CONCERNS/INTERVENTIONS:  None per patient.     SOCIAL HX:  Social History   Tobacco Use  . Smoking status: Never Smoker  . Smokeless tobacco: Never Used  Substance Use Topics  . Alcohol use: No    Alcohol/week: 0.0 oz    CODE STATUS:  DNR  ADVANCED DIRECTIVES: Patient stated her neighbor, Di Kindle, is "in charge of her money". MOST FORM COMPLETE:  Yes HOSPICE EDUCATION PROVIDED:  SW provided education about Hospice.  PPS:  Patient's intake is normal.  She can no longer walk with her walker. Duration of visit and documentation:  75 minutes.      Creola Corn Rayder Sullenger, LCSW

## 2017-07-06 NOTE — Progress Notes (Signed)
COMMUNITY PALLIATIVE CARE RN NOTE  PATIENT NAME: Suzanne Kim DOB: 1940-03-10 MRN: [id number]  PRIMARY CARE PROVIDER: [facility]  RESPONSIBLE PARTY:  Acct ID - Guarantor Home Phone Work Phone Relationship Acct Type  [id number] - Serres,DI* [phone]  Self P/F     [address], [place], [place] [address]    PLAN OF CARE and INTERVENTION:  1. ADVANCE CARE PLANNING/GOALS OF CARE: Remain at home for as long as possible with hired caregivers and avoid hospitalizations 2. PATIENT/CAREGIVER EDUCATION: Reinforced Safe Transfers/Mobility and Fall Precautions 3. DISEASE STATUS: Joint visit made with Palliative Care SW Larita Fife Duffy. Patient sitting up in her recliner upon arrival, where she spends 99% of her time. Remains alert and oriented and able to answer questions and engage in appropriate conversation. Long time neighbor/friend came over during the visit who also helps care for patient. Denies pain. Reports that she has a cold and was seen this week by her PCP who is going to order her an antibiotic and decongestant to help. States that she has not been feeling well for the past 2 weeks. Feels weaker and at times feels as if her legs are going to give out on her. No falls since last visit a month ago. Patient requires 1 person assistance with transfers. She has a lift chair that helps her to a more upright position but only able to pivot into her transport wheelchair. Caregiver has to push patient to the bathroom, all the way up to the commode so patient is able to grab onto the bar and pull herself up. Patient used to be able to ambulate a few steps using her walker. Remains able to give herself a sponge bath while sitting in the bathroom, but requires assistance with dressing. Reports having a good appetite eating 3 meals/day. Able to feed herself independently and take medications without difficulty. Last BM 07/04/17. Intermittent incontinence of both bowel and bladder. Patient is  worried about how long she will be able to remain in her home and what happens if she needs assistance in the middle of the night. Patient has not had any bowel incontinence during the night as of yet, but is very concerned. Recommended that patient speak with current Home health agency to inquire what all they can do for her when she declines further and about increasing care hours. Also educated on process of moving to Skilled Care when she feels it is necessary and that Palliative care could assist her with this process. Patient less worried since knowing some of her options. She would prefer to remain at home for as long as she is financially able.    HISTORY OF PRESENT ILLNESS:  This is a 77 yo female who resides in her home with the assistance of hired caregivers to assist for a few hours each day. Patient continues to be followed by Palliative Care Services. Next visit scheduled in 1 month.  CODE STATUS: DNR  ADVANCED DIRECTIVES: Y MOST FORM: yes PPS: 30%   PHYSICAL EXAM:   VITALS: Today's Vitals   07/05/17 1545  BP: 108/68  Pulse: 78  Resp: 16  Temp: 98.1 F (36.7 C)  TempSrc: Oral  SpO2: 99%  PainSc: 0-No pain    LUNGS: decreased breath sounds CARDIAC: Cor RRR EXTREMITIES: No edema SKIN: Exposed skin dry and intact  NEURO: Alert and oriented x 3, pleasant and engaging, generalized weakness  (Duration of visit and documentation 90 minutes)     [name], RN, BSN

## 2017-08-09 ENCOUNTER — Other Ambulatory Visit: Payer: Medicare Other | Admitting: Licensed Clinical Social Worker

## 2017-08-09 ENCOUNTER — Other Ambulatory Visit: Payer: Medicare Other | Admitting: *Deleted

## 2017-08-09 DIAGNOSIS — Z515 Encounter for palliative care: Secondary | ICD-10-CM

## 2017-08-09 NOTE — Progress Notes (Signed)
COMMUNITY PALLIATIVE CARE RN NOTE  PATIENT NAME: Suzanne Kim DOB: March 10, 1940 MRN: [id number]  PRIMARY CARE PROVIDER: [facility]  RESPONSIBLE PARTY:  Acct ID - Guarantor Home Phone Work Phone Relationship Acct Type  [id number] - Chaudhary,DI* [phone]  Self P/F     [address], [place], [place] [address]    PLAN OF CARE and INTERVENTION:  1. ADVANCE CARE PLANNING/GOALS OF CARE: Remain at home for as long as possible, avoid hospitalizations 2. PATIENT/CAREGIVER EDUCATION: Reinforced Safety/Fall Precautions 3. DISEASE STATUS: Joint visit made with Palliative Care SW Larita Fife Duffy. Patient sitting up in recliner upon arrival. Very pleasant and engaging. Denies pain at this time. Patient reports that her legs continue to become much weaker and having more difficulty standing. Patient utilizes her lift chair to assist in standing and she has to hold onto her walker for stability. Patient able to bear weight but only for a few seconds. Requires 1 person assistance with transfers. Hired caregiver transports patient via wheelchair to and from the bathroom. Caregiver assists patient during the day with bathing, dressing and toileting and leaves at 1:30p. The neighbor's daughter comes over about 5:30p each evening to provide patient with dinner and to help her get ready for the night. Patient continues to sleep in her recliner, as patient is unable to lie flat d/t the curvature/pain in her back when doing so. She has an Emergency pendant for emergencies. Intake remains normal, eating 3 meals/day. Feels that she may have lost some weight, due to clothes fitting slightly looser, but is able to stand on a scale long enough to obtain a weight. Intermittent incontinence of both bowel and bladder. Wears Depends. Patient also inquiring about how she can get her hair washed since she is unable to stand and would have to use the kitchen sink while in the wheelchair. Provided patient a website and  phone number of hair wash devices to help with this.   HISTORY OF PRESENT ILLNESS:  This is a 77 yo female who resides at home alone with assistance of caregivers for personal care needs. Palliative Care to continue to follow. Next visit scheduled in 1 month.   CODE STATUS: DNR ADVANCED DIRECTIVES: Y MOST FORM: yes PPS: 30%   PHYSICAL EXAM:   VITALS: Today's Vitals   08/09/17 1429  BP: 112/64  Pulse: 83  Resp: 14  SpO2: 98%  PainSc: 0-No pain    LUNGS: decreased breath sounds CARDIAC: Cor RRR EXTREMITIES: No edema SKIN: Exposed skin dry and intact  NEURO: Alert and oriented x 3, pleasant mood, increased generalized weakness, non ambulatory   (Duration of visit and documentation 75 minutes)    [name], RN, BSN

## 2017-08-10 NOTE — Progress Notes (Signed)
COMMUNITY PALLIATIVE CARE SW NOTE  PATIENT NAME: Suzanne Kim DOB: 1940/05/14 MRN: [id number]  PRIMARY CARE PROVIDER: [facility]  RESPONSIBLE PARTY:  Acct ID - Guarantor Home Phone Work Phone Relationship Acct Type  [id number] [name]  Self P/F     [address], [place], [place] [address]     PLAN OF CARE and INTERVENTIONS:             1. GOALS OF CARE/ ADVANCE CARE PLANNING:  Patient's goal is for her to remain in her home.  She has a DNR and MOST form. 2. SOCIAL/EMOTIONAL/SPIRITUAL ASSESSMENT/ INTERVENTIONS:  SW and Palliative Care RN, Daryl Eastern, met with patient in her home.  Patient was sitting in her recliner.  She was alert and oriented x3.  SW provided active listening while she talked about her mother and sister.  Her mother is 29 years old and phones her daily.  Patient stated her sister is younger than her and they do not have a close relationship.  Her sister and her husband take turns staying with their mother on weekends.  Patient expressed anxiety regarding her loss of strength.  SW provided supportive counseling. 3. PATIENT/CAREGIVER EDUCATION/ COPING:  Patient copes by expressing her feelings and thoughts openly. 4. PERSONAL EMERGENCY PLAN:  She has a life alert pendant and contacts her neighbor with medical concerns. 5. COMMUNITY RESOURCES COORDINATION/ HEALTH CARE NAVIGATION:  Patient has caregivers from [facility] a few hours per day.  Her neighbor's daughter also helps in the evening. 6. FINANCIAL/LEGAL CONCERNS/INTERVENTIONS:  None per patient.     SOCIAL HX:  Social History   Tobacco Use  . Smoking status: Never Smoker  . Smokeless tobacco: Never Used  Substance Use Topics  . Alcohol use: No    Alcohol/week: 0.0 oz    CODE STATUS:  DNR ADVANCED DIRECTIVES: N MOST FORM COMPLETE:  Y HOSPICE EDUCATION PROVIDED:   Provided patient with Palliative Care flyer. PPS:  Patient's intake is normal.  Patient said she feels  as if she is getting weaker.   Duration of visit and documentation:  60 minutes.      Creola Corn Traye Bates, LCSW

## 2017-09-04 ENCOUNTER — Other Ambulatory Visit: Payer: Medicare Other | Admitting: *Deleted

## 2017-09-04 ENCOUNTER — Other Ambulatory Visit: Payer: Medicare Other | Admitting: Licensed Clinical Social Worker

## 2017-09-04 DIAGNOSIS — Z515 Encounter for palliative care: Secondary | ICD-10-CM

## 2017-09-04 NOTE — Progress Notes (Signed)
COMMUNITY PALLIATIVE CARE RN NOTE  PATIENT NAME: Suzanne Kim DOB: 07-24-1940 MRN: [id number]  PRIMARY CARE PROVIDER: [facility]  RESPONSIBLE PARTY:  Acct ID - Guarantor Home Phone Work Phone Relationship Acct Type  [id number] - Furry,DI* [phone]  Self P/F     [address], [place], [place] [address]    PLAN OF CARE and INTERVENTION:  1. ADVANCE CARE PLANNING/GOALS OF CARE: Remain in her home with hired caregivers, avoid hospitalizations 2. PATIENT/CAREGIVER EDUCATION: Reinforced Safe Mobility and Transfers 3. DISEASE STATUS: Joint visit made with Palliative Care SW Larita Fife Duffy. Patient sitting up in her recliner awake and alert. Pleasant and engaging. Denies pain. Continues with progressive bilateral lower extremity weakness. Remains able to stand and transfer only with use of lift chair and 1 person assistance. No longer able to take steps. Transported via wheelchair. Requires assistance with bathing, dressing and toileting. She states her intake is fair. Continues to take her medications whole with water. No dysphagia reported. Continues with hired caregivers for a few hours during the day, then again in the evening. She is incontinent of bowel and bladder. Wears Depends. No reports of cold symptoms this visit. Will continue to monitor overall patient condition.    HISTORY OF PRESENT ILLNESS: This is a 77 yo female who lives at home alone with assistance of hired caregivers and neighbor. Palliative Care continues to follow. Next visit scheduled in 1 month.   CODE STATUS: DNR  ADVANCED DIRECTIVES: Y MOST FORM: yes PPS: 30%   PHYSICAL EXAM:   VITALS: Today's Vitals   09/04/17 1424  BP: 126/76  Pulse: 84  Resp: 14  SpO2: 98%  PainSc: 0-No pain    LUNGS: clear to auscultation  CARDIAC: Cor RRR EXTREMITIES: No edema SKIN: Exposed skin dry and intact, denies any skin issues  NEURO: Alert and oriented x 4, pleasant mood, transfers with 1 person  assistance, non-ambulatory   (Duration of visit and documentation 75 minutes)    [name], RN, BSN

## 2017-09-06 NOTE — Progress Notes (Signed)
COMMUNITY PALLIATIVE CARE SW NOTE  PATIENT NAME: Suzanne Kim DOB: 16-Oct-1940 MRN: [id number]  PRIMARY CARE PROVIDER: [facility]  RESPONSIBLE PARTY:  Acct ID - Guarantor Home Phone Work Phone Relationship Acct Type  [id number] Suzanne [phone]  Self P/F     [place], [place],  [address]     PLAN OF CARE and INTERVENTIONS:             1. GOALS OF CARE/ ADVANCE CARE PLANNING:  Patient wishes to remain in her home.  She has a DNR and MOST form. 2. SOCIAL/EMOTIONAL/SPIRITUAL ASSESSMENT/ INTERVENTIONS:  SW and Palliative Care SW met with patient and her neighbor in her home.  Patient was in her recliner.  She denied pain, but reports her feet feel weaker.  She is looking forward to her hairdresser coming to her home and washing her hair.  Patient openly discussed the relationship she had with her boyfriend for 18 years and how he died from brain cancer.  She also talked about her mother being under Hospice in another state and how much she enjoys speaking with her on the phone daily. 3. PATIENT/CAREGIVER EDUCATION/ COPING:  Continue providing education regarding Palliative Care. 4. PERSONAL EMERGENCY PLAN:  Patient has a life alert pendant.  She also contacts her neighbor of 40 years. 5. COMMUNITY RESOURCES COORDINATION/ HEALTH CARE NAVIGATION:  Patient has new caregivers that assist her. 6. FINANCIAL/LEGAL CONCERNS/INTERVENTIONS:  Patient has no financial concerns.     SOCIAL HX:  Social History   Tobacco Use  . Smoking status: Never Smoker  . Smokeless tobacco: Never Used  Substance Use Topics  . Alcohol use: No    Alcohol/week: 0.0 standard drinks    CODE STATUS:  DNR  ADVANCED DIRECTIVES: N MOST FORM COMPLETE:  Y HOSPICE EDUCATION PROVIDED: N PPS:  Patient reports her intake is normal.  She is able to move from her recliner to her w/c independently. Duration of visit and documentation:  75 minutes.      Suzanne Corn Suzanne Wann, LCSW

## 2017-10-02 ENCOUNTER — Other Ambulatory Visit: Payer: Medicare Other | Admitting: Licensed Clinical Social Worker

## 2017-10-02 ENCOUNTER — Other Ambulatory Visit: Payer: Medicare Other | Admitting: *Deleted

## 2017-10-06 ENCOUNTER — Other Ambulatory Visit: Payer: Medicare Other | Admitting: Licensed Clinical Social Worker

## 2017-10-06 ENCOUNTER — Other Ambulatory Visit: Payer: Medicare Other | Admitting: *Deleted

## 2017-10-06 DIAGNOSIS — Z515 Encounter for palliative care: Secondary | ICD-10-CM

## 2017-10-08 NOTE — Progress Notes (Signed)
COMMUNITY PALLIATIVE CARE SW NOTE  PATIENT NAME: Suzanne Kim DOB: Dec 18, 1940 MRN: [id number]  PRIMARY CARE PROVIDER: [facility]  RESPONSIBLE PARTY:  Acct ID - Guarantor Home Phone Work Phone Relationship Acct Type  [id number] [name]  Self P/F     [address], [place], [place] [address]     PLAN OF CARE and INTERVENTIONS:             1. GOALS OF CARE/ ADVANCE CARE PLANNING:  Patient wishes to remain at home and not be hospitalized.  She has a DNR and MOST form. 2. SOCIAL/EMOTIONAL/SPIRITUAL ASSESSMENT/ INTERVENTIONS:  SW and Palliative Care RN Daryl Eastern met with patient in her home.  She expressed feeling good because her hairdresser did her hair yesterday at her home.  Discussed her mother's health, which is declining.  She no longer speaks with her daily.  Patient became tearful briefly as she expressed her feelings.  Provided supportive counseling and active listening.  She appeared to respond positively to reminiscing about her grandparents. 3. PATIENT/CAREGIVER EDUCATION/ COPING:  Patient copes by expressing her feelings openly. 4. PERSONAL EMERGENCY PLAN:  Patient has a life alert pendant.  She also contacts her neighbor if needed. 5. COMMUNITY RESOURCES COORDINATION/ HEALTH CARE NAVIGATION:  Patient has caregivers through an agency for 3 hours everyday. 6. FINANCIAL/LEGAL CONCERNS/INTERVENTIONS:  None per patient.     SOCIAL HX:  Social History   Tobacco Use  . Smoking status: Never Smoker  . Smokeless tobacco: Never Used  Substance Use Topics  . Alcohol use: No    Alcohol/week: 0.0 standard drinks    CODE STATUS:  DNR ADVANCED DIRECTIVES: N MOST FORM COMPLETE:  Y HOSPICE EDUCATION PROVIDED:  Patient is familiar with Hospice. PPS:  Patient reports her intake is normal.  Patient reports remaining week.  She is able to move from her recliner to her w/c independently. Duration of visit and documentation:  75  minutes.     Creola Corn Lundyn Coste, LCSW

## 2017-10-11 NOTE — Progress Notes (Signed)
COMMUNITY PALLIATIVE CARE RN NOTE  PATIENT NAME: Suzanne Kim DOB: [birth year] MRN: [id number]  PRIMARY CARE PROVIDER: [facility]  RESPONSIBLE PARTY:  Acct ID - Guarantor Home Phone Work Phone Relationship Acct Type  [id number] - [place],DI* [phone]  Self P/F     [address], [place], [place] [address]    PLAN OF CARE and INTERVENTION:  1. ADVANCE CARE PLANNING/GOALS OF CARE: She wants to remain at home for as long as possible with hired caregivers and avoid hospitalizations 2. PATIENT/CAREGIVER EDUCATION: Reinforced Safe Mobility/Transfers 3. DISEASE STATUS: Joint visit made with Palliative Care SW Larita Fife Duffy. Patient sitting up in recliner awake and alert. Denies pain during visit, but does experience occasional back pain. Tylenol effective. No dyspnea noted. Main complaint is progressive leg weakness. Requires 1 person assistance with standing, transfers, bathing and dressing. Unable to bear weight more than a few seconds. Transported via wheelchair. Intermittent incontinence of both bowel and bladder. She says that she remains able to pay her bills via computer. Continues to eat 3 meals/day of small portions. Denies dysphagia. Became tearful in speaking of her mother, who is declining and on hospice services in another state. Provided active listening and support. Continues with hired caregivers daily for several hours in the am and pm. Will continue to monitor.  HISTORY OF PRESENT ILLNESS:  This is a 77 yo female who resides at home alone, with assistance of hired caregivers twice daily with ADLs. Palliative Care team continues to follow patient. Next visit scheduled in 1 month.   CODE STATUS: DNR ADVANCED DIRECTIVES: N MOST FORM: Y PPS: 30%   PHYSICAL EXAM:   VITALS: Today's Vitals   10/06/17 1335  BP: 128/74  Pulse: 79  Resp: 16  SpO2: 97%  PainSc: 0-No pain    LUNGS: clear to auscultation  CARDIAC: Cor RRR EXTREMITIES: No edema SKIN: Frail/thin  skin, exposed skin dry and intact  NEURO: Alert and oriented x 3, pleasant mood, generalized weakness, transported via wheelchair   (Duration of visit and documentation 60 minutes)    [name], RN, BSN

## 2017-11-06 ENCOUNTER — Other Ambulatory Visit: Payer: Medicare Other | Admitting: Licensed Clinical Social Worker

## 2017-11-06 DIAGNOSIS — Z515 Encounter for palliative care: Secondary | ICD-10-CM

## 2017-11-07 DIAGNOSIS — F329 Major depressive disorder, single episode, unspecified: Secondary | ICD-10-CM | POA: Diagnosis not present

## 2017-11-07 DIAGNOSIS — Z23 Encounter for immunization: Secondary | ICD-10-CM | POA: Diagnosis not present

## 2017-11-07 DIAGNOSIS — G4700 Insomnia, unspecified: Secondary | ICD-10-CM | POA: Diagnosis not present

## 2017-11-07 DIAGNOSIS — G35 Multiple sclerosis: Secondary | ICD-10-CM | POA: Diagnosis not present

## 2017-11-07 DIAGNOSIS — K219 Gastro-esophageal reflux disease without esophagitis: Secondary | ICD-10-CM | POA: Diagnosis not present

## 2017-11-07 NOTE — Progress Notes (Signed)
COMMUNITY PALLIATIVE CARE SW NOTE  PATIENT NAME: Suzanne Kim DOB: 1940-07-04 MRN: [id number]  PRIMARY CARE PROVIDER: [facility]  RESPONSIBLE PARTY:  Acct ID - Guarantor Home Phone Work Phone Relationship Acct Type  [id number] [name]  Self P/F     [address], [place], [place] [address]     PLAN OF CARE and INTERVENTIONS:             1. GOALS OF CARE/ ADVANCE CARE PLANNING:  Patient wants to remain in her home and not be hospitalized.  She has a DNR and MOST form. 2. SOCIAL/EMOTIONAL/SPIRITUAL ASSESSMENT/ INTERVENTIONS:  SW met with patient and her neighbor in her home.  Patient denied pain.  She became tearful when she said her mother died in her home in [place] two weeks ago.  She appeared to be grieving appropriately.  SW provided active listening and supportive counseling while she talked about a stressful court case she had to testify in over thirty years ago.  She attributes her decreased health on the stress caused by this event. 3. PATIENT/CAREGIVER EDUCATION/ COPING:  Patient copes by expressing her feelings and needs. 4. PERSONAL EMERGENCY PLAN:  Patient has a life alert.  She also has caregivers and a neighbor that will contact EMS if needed. 5. COMMUNITY RESOURCES COORDINATION/ HEALTH CARE NAVIGATION:  Patient has caregivers through an agency for three hours everyday. 6. FINANCIAL/LEGAL CONCERNS/INTERVENTIONS:  None per patient.     SOCIAL HX:  Social History   Tobacco Use  . Smoking status: Never Smoker  . Smokeless tobacco: Never Used  Substance Use Topics  . Alcohol use: No    Alcohol/week: 0.0 standard drinks    CODE STATUS:  DNR ADVANCED DIRECTIVES: N MOST FORM COMPLETE:  Y HOSPICE EDUCATION PROVIDED:  N PPS: Patient reports her intake is normal.  She reports increased weakness.  She moves from her recliner to her w/c. Duration of visit and documentation:  90 minutes.      Creola Corn Adria Costley, LCSW

## 2017-11-16 DIAGNOSIS — Z79899 Other long term (current) drug therapy: Secondary | ICD-10-CM | POA: Diagnosis not present

## 2017-11-16 DIAGNOSIS — E559 Vitamin D deficiency, unspecified: Secondary | ICD-10-CM | POA: Diagnosis not present

## 2017-11-29 ENCOUNTER — Other Ambulatory Visit: Payer: Medicare Other | Admitting: Licensed Clinical Social Worker

## 2017-11-29 ENCOUNTER — Other Ambulatory Visit: Payer: Medicare Other | Admitting: *Deleted

## 2017-11-29 DIAGNOSIS — Z515 Encounter for palliative care: Secondary | ICD-10-CM

## 2017-11-30 NOTE — Progress Notes (Signed)
COMMUNITY PALLIATIVE CARE SW NOTE  PATIENT NAME: Suzanne Kim DOB: Jun 12, 1940 MRN: [id number]  PRIMARY CARE PROVIDER: [facility]  RESPONSIBLE PARTY:  Acct ID - Guarantor Home Phone Work Phone Relationship Acct Type  [id number] [name]  Self P/F     [address], [place], [place] [address]     PLAN OF CARE and INTERVENTIONS:             1. GOALS OF CARE/ ADVANCE CARE PLANNING:  Patient's goal is to remain in her home.  She has a DNR and MOST form. 2. SOCIAL/EMOTIONAL/SPIRITUAL ASSESSMENT/ INTERVENTIONS:  SW and Palliative Care RN, Daryl Eastern, met with patient in her home.  Patient denied pain.  She was alert and oriented x3.  She continues paying her own bills and using her computer.  She said she is eating better since purchasing an air fryer.  Her caregivers prepare meals for her.  She stated feeling less grief about her mothers recent death.  Her sister is the executor of the will and Lyndie expressed trusting her.  SW provided active listening and supportive counseling. 3. PATIENT/CAREGIVER EDUCATION/ COPING:  Patient copes by expressing her feelings and needs opently. 4. PERSONAL EMERGENCY PLAN:  Patient has a life alert pendent.  She also has caregivers and a neighbor that will provide assistance. 5. COMMUNITY RESOURCES COORDINATION/ HEALTH CARE NAVIGATION:  Patient has caregivers through an agency three hours a day. 6. FINANCIAL/LEGAL CONCERNS/INTERVENTIONS:  None.     SOCIAL HX:  Social History   Tobacco Use  . Smoking status: Never Smoker  . Smokeless tobacco: Never Used  Substance Use Topics  . Alcohol use: No    Alcohol/week: 0.0 standard drinks    CODE STATUS:  DNR  ADVANCED DIRECTIVES: N MOST FORM COMPLETE:  Y HOSPICE EDUCATION PROVIDED:  Patient is familiar with Hospice. PPS:  Patient states her appetite is normal.  She can transfer from her lift chair to her w/c but reports having increased difficulty moving her  feet.\Duration of visit and documentation:  60 minutes.      Creola Corn Aryka Coonradt, LCSW

## 2017-12-01 NOTE — Progress Notes (Signed)
COMMUNITY PALLIATIVE CARE RN NOTE  PATIENT NAME: Suzanne Kim DOB: 04-25-1940 MRN: [id number]  PRIMARY CARE PROVIDER: [facility]  RESPONSIBLE PARTY:  Acct ID - Guarantor Home Phone Work Phone Relationship Acct Type  [id number] - [place],DI* [phone]  Self P/F     [address], [place], [place] [address]    PLAN OF CARE and INTERVENTION:  1. ADVANCE CARE PLANNING/GOALS OF CARE: Remain in her home for as long as possible and avoid hospitalizations 2. PATIENT/CAREGIVER EDUCATION: Reinforced Safe Transfers 3. DISEASE STATUS: Joint visit made with Palliative Care SW, Suzanne Kim. Patient sitting up in her recliner awake and alert. She is pleasant and conversational. Denies pain at this time. She continues to report progressive leg weakness. She is able to stand with assistance of her lift chair, caregiver and walker, but only for a very short period of time. She is only able to shuffle her feet enough to transfer to her wheelchair, but is finding this to be more difficult. Continues to require assistance with all ADLs, except feeding. She is incontinent of urine and wears Depends. Her intake is normal. She denies dyspnea. Her current concern is that she has a pessary that was supposed to be cleaned by her Gynecologist and re-inserted in October, however due to her current condition, she was unable to make this appointment. Will reach out to her Gynecologist to see what to do at this point since she is unable to make it into the office. Her last appointment was in May when she was much more ambulatory. She continues with hired caregivers a few hours during the day and a few hours in the evenings. Will continue to monitor.   HISTORY OF PRESENT ILLNESS:  This is a 77 yo female who resides in her home with hired caregivers for a few hours 2x/day. Palliative Care Team continues to follow patient. Next visit scheduled in 1 month.  CODE STATUS: DNR ADVANCED DIRECTIVES: N MOST FORM:  yes PPS: 30%   PHYSICAL EXAM:   VITALS: Today's Vitals   11/29/17 1540  BP: (!) 142/81  Pulse: 68  Resp: 18  Temp: 98 F (36.7 C)  SpO2: 100%  PainSc: 0-No pain    LUNGS: clear to auscultation  CARDIAC: Cor RRR EXTREMITIES: No edema SKIN: Exposed skin is dry and intact  NEURO: Alert and oriented x 3, pleasant mood, increased generalized weakness, wheelchair bound   (Duration of visit and documentation 60 minutes)    [name], RN, BSN

## 2017-12-27 ENCOUNTER — Other Ambulatory Visit: Payer: Medicare Other | Admitting: Licensed Clinical Social Worker

## 2017-12-27 ENCOUNTER — Other Ambulatory Visit: Payer: Medicare Other | Admitting: *Deleted

## 2017-12-27 DIAGNOSIS — Z515 Encounter for palliative care: Secondary | ICD-10-CM

## 2017-12-27 NOTE — Progress Notes (Signed)
COMMUNITY PALLIATIVE CARE SW NOTE  PATIENT NAME: Suzanne Kim DOB: [birth year] MRN: [id number]  PRIMARY CARE PROVIDER: [facility]  RESPONSIBLE PARTY:  Acct ID - Guarantor Home Phone Work Phone Relationship Acct Type  [id number] [name]  Self P/F     [address], [place], [place] [address]    PLAN OF CARE and INTERVENTIONS:             1. GOALS OF CARE/ ADVANCE CARE PLANNING:  Patient's goal is to remain in her home.  She has a DNR and MOST form. 2. SOCIAL/EMOTIONAL/SPIRITUAL ASSESSMENT/ INTERVENTIONS:  SW and Palliative Care RN, Daryl Eastern, met with patient in her home.  She was alert and oriented x3.  She expressed being frustrated since the agency changed her caregiver again.  Patient is able to manipulate her environment effectively.  She uses her computer and smart speaker device.  She is also reading the Bible more in honor to her deceased mother.  SW provided active listening and supportive counseling. 3. PATIENT/CAREGIVER EDUCATION/ COPING:  Patient copes by expressing her feelings and needs opently. 4. PERSONAL EMERGENCY PLAN:  Patient has a life alert pendent.  She also has caregivers and a neighbor that will provide assistance. 5. COMMUNITY RESOURCES COORDINATION/ HEALTH CARE NAVIGATION:  Patient has caregivers through an agency three hours a day. 6. FINANCIAL/LEGAL CONCERNS/INTERVENTIONS:  None.         SOCIAL HX:  Social History   Tobacco Use  . Smoking status: Never Smoker  . Smokeless tobacco: Never Used  Substance Use Topics  . Alcohol use: No    Alcohol/week: 0.0 standard drinks    CODE STATUS:  DNR  ADVANCED DIRECTIVES: N MOST FORM COMPLETE:  Y HOSPICE EDUCATION PROVIDED: No.  Patient is familiar with Hospice. PPS:  Patient reports her appetite is normal.  She transfers from her lift chair to her w/c. Duration of visit and documentation:  60 minutes.      Suzanne Corn Mamie Hundertmark, LCSW

## 2017-12-28 NOTE — Progress Notes (Signed)
COMMUNITY PALLIATIVE CARE RN NOTE  PATIENT NAME: Suzanne Kim DOB: 1940/02/18 MRN: [id number]  PRIMARY CARE PROVIDER: [facility]  RESPONSIBLE PARTY:  Acct ID - Guarantor Home Phone Work Phone Relationship Acct Type  [id number] - [place],DI* [phone]  Self P/F     [address], [place],  [address]    PLAN OF CARE and INTERVENTION:  1. ADVANCE CARE PLANNING/GOALS OF CARE: Remain in her home and avoid going to the hospital. She is a DNR. 2. PATIENT/CAREGIVER EDUCATION: Reinforced Safe Mobility/Transfers 3. DISEASE STATUS: Joint visit made with Palliative Care SW, Lynn Duffy. Met with patient in her home. She is sitting up in her recliner awake and alert. She is pleasant and engaging. She denies pain at this time, but does occasionally experience back pain. She states that recently she has been having muscle spasms in both of her legs. They are not painful, but they cause her legs to draw inwards at times. She is currently taking Neurontin 200 mg at bedtime. She reports that she does not sleep well during the night, but she does take naps during the day. She continues with hired caregivers for several hours during the day and in the evening to help with personal care, prepare meals and household chores. Her intake is normal. She is eating 3 meals/day. She remains able to transfer to her transporter wheelchair with the help from her lift chair, walker and 1 person assistance. Shuffled gait. Continues with bilateral leg weakness. She states that today, while transferring with her walker, her R arm became very weak causing her hand/arm to slip off of her walker x 2 incidents. The caregiver was there to prevent her from falling. Recommended that she starts using her gait belt in case this continues to happen. She requires assistance with bathing, dressing and transfers, but remains able to feed herself independently. Will continue to monitor.  HISTORY OF PRESENT ILLNESS:  This  is a 77 yo female who resides in her home. She has assistance from hired caregivers twice daily. Palliative Care Team continues to follow patient at home. Next visit scheduled in 1 month.  CODE STATUS: DNR ADVANCED DIRECTIVES: N MOST FORM: yes PPS: 30%   PHYSICAL EXAM:   VITALS: Today's Vitals   12/27/17 1547  BP: (!) 156/82  Pulse: 84  Resp: 16  Temp: 98.9 F (37.2 C)  TempSrc: Temporal  SpO2: 99%  PainSc: 0-No pain    LUNGS: clear to auscultation  CARDIAC: Cor RRR EXTREMITIES: No edema SKIN: Exposed skin is dry and intact  NEURO: Alert and oriented x 3, pleasant mood, generalized weakness, wheelchair bound   (Duration of visit and documentation 60 minutes)    Daryl Eastern, RN, BSN

## 2018-01-15 DIAGNOSIS — G35 Multiple sclerosis: Secondary | ICD-10-CM | POA: Diagnosis not present

## 2018-01-15 DIAGNOSIS — J069 Acute upper respiratory infection, unspecified: Secondary | ICD-10-CM | POA: Diagnosis not present

## 2018-01-15 DIAGNOSIS — R509 Fever, unspecified: Secondary | ICD-10-CM | POA: Diagnosis not present

## 2018-01-15 DIAGNOSIS — M545 Low back pain: Secondary | ICD-10-CM | POA: Diagnosis not present

## 2018-01-22 ENCOUNTER — Encounter (HOSPITAL_COMMUNITY): Payer: Self-pay | Admitting: Emergency Medicine

## 2018-01-22 ENCOUNTER — Emergency Department (HOSPITAL_COMMUNITY): Payer: Medicare Other

## 2018-01-22 ENCOUNTER — Inpatient Hospital Stay (HOSPITAL_COMMUNITY)
Admission: EM | Admit: 2018-01-22 | Discharge: 2018-01-25 | DRG: 377 | Disposition: A | Discharge status: Skilled Nursing Facility | Payer: Medicare Other | Attending: Family Medicine | Admitting: Family Medicine

## 2018-01-22 ENCOUNTER — Other Ambulatory Visit: Payer: Self-pay

## 2018-01-22 DIAGNOSIS — R195 Other fecal abnormalities: Secondary | ICD-10-CM | POA: Diagnosis present

## 2018-01-22 DIAGNOSIS — G4700 Insomnia, unspecified: Secondary | ICD-10-CM | POA: Diagnosis present

## 2018-01-22 DIAGNOSIS — D696 Thrombocytopenia, unspecified: Secondary | ICD-10-CM

## 2018-01-22 DIAGNOSIS — R748 Abnormal levels of other serum enzymes: Secondary | ICD-10-CM | POA: Diagnosis present

## 2018-01-22 DIAGNOSIS — K922 Gastrointestinal hemorrhage, unspecified: Secondary | ICD-10-CM | POA: Diagnosis not present

## 2018-01-22 DIAGNOSIS — G35 Multiple sclerosis: Secondary | ICD-10-CM | POA: Diagnosis present

## 2018-01-22 DIAGNOSIS — R630 Anorexia: Secondary | ICD-10-CM | POA: Diagnosis present

## 2018-01-22 DIAGNOSIS — Z79899 Other long term (current) drug therapy: Secondary | ICD-10-CM

## 2018-01-22 DIAGNOSIS — Z9882 Breast implant status: Secondary | ICD-10-CM

## 2018-01-22 DIAGNOSIS — D62 Acute posthemorrhagic anemia: Secondary | ICD-10-CM

## 2018-01-22 DIAGNOSIS — D649 Anemia, unspecified: Secondary | ICD-10-CM | POA: Diagnosis not present

## 2018-01-22 DIAGNOSIS — J9691 Respiratory failure, unspecified with hypoxia: Secondary | ICD-10-CM | POA: Diagnosis present

## 2018-01-22 DIAGNOSIS — K254 Chronic or unspecified gastric ulcer with hemorrhage: Secondary | ICD-10-CM | POA: Diagnosis present

## 2018-01-22 DIAGNOSIS — J90 Pleural effusion, not elsewhere classified: Secondary | ICD-10-CM

## 2018-01-22 DIAGNOSIS — R740 Nonspecific elevation of levels of transaminase and lactic acid dehydrogenase [LDH]: Secondary | ICD-10-CM | POA: Diagnosis present

## 2018-01-22 DIAGNOSIS — M62838 Other muscle spasm: Secondary | ICD-10-CM | POA: Diagnosis present

## 2018-01-22 DIAGNOSIS — I4510 Unspecified right bundle-branch block: Secondary | ICD-10-CM | POA: Diagnosis present

## 2018-01-22 DIAGNOSIS — M6282 Rhabdomyolysis: Secondary | ICD-10-CM | POA: Diagnosis present

## 2018-01-22 DIAGNOSIS — K449 Diaphragmatic hernia without obstruction or gangrene: Secondary | ICD-10-CM | POA: Diagnosis present

## 2018-01-22 DIAGNOSIS — K257 Chronic gastric ulcer without hemorrhage or perforation: Secondary | ICD-10-CM | POA: Diagnosis not present

## 2018-01-22 DIAGNOSIS — Z5329 Procedure and treatment not carried out because of patient's decision for other reasons: Secondary | ICD-10-CM | POA: Diagnosis not present

## 2018-01-22 DIAGNOSIS — K219 Gastro-esophageal reflux disease without esophagitis: Secondary | ICD-10-CM | POA: Diagnosis present

## 2018-01-22 DIAGNOSIS — Z66 Do not resuscitate: Secondary | ICD-10-CM | POA: Diagnosis present

## 2018-01-22 DIAGNOSIS — M810 Age-related osteoporosis without current pathological fracture: Secondary | ICD-10-CM | POA: Diagnosis present

## 2018-01-22 LAB — COMPREHENSIVE METABOLIC PANEL
ALBUMIN: 2.9 g/dL — AB (ref 3.5–5.0)
ALT: 52 U/L — ABNORMAL HIGH (ref 0–44)
AST: 105 U/L — ABNORMAL HIGH (ref 15–41)
Alkaline Phosphatase: 57 U/L (ref 38–126)
Anion gap: 5 (ref 5–15)
BILIRUBIN TOTAL: 0.6 mg/dL (ref 0.3–1.2)
BUN: 22 mg/dL (ref 8–23)
CO2: 21 mmol/L — ABNORMAL LOW (ref 22–32)
Calcium: 8.1 mg/dL — ABNORMAL LOW (ref 8.9–10.3)
Chloride: 116 mmol/L — ABNORMAL HIGH (ref 98–111)
Creatinine, Ser: 0.88 mg/dL (ref 0.44–1.00)
GFR calc non Af Amer: >60 mL/min (ref >60)
GLUCOSE: 102 mg/dL — AB (ref 70–99)
POTASSIUM: 3.6 mmol/L (ref 3.5–5.1)
Sodium: 142 mmol/L (ref 135–145)
TOTAL PROTEIN: 5.4 g/dL — AB (ref 6.5–8.1)

## 2018-01-22 LAB — PREPARE RBC (CROSSMATCH)

## 2018-01-22 LAB — BRAIN NATRIURETIC PEPTIDE: B NATRIURETIC PEPTIDE 5: 461.5 pg/mL — AB (ref 0.0–100.0)

## 2018-01-22 LAB — CBC WITH DIFFERENTIAL/PLATELET
Abs Immature Granulocytes: 0.12 10*3/uL — ABNORMAL HIGH (ref 0.00–0.07)
BASOS ABS: 0 10*3/uL (ref 0.0–0.1)
Basophils Relative: 0 %
EOS PCT: 1 %
Eosinophils Absolute: 0.1 10*3/uL (ref 0.0–0.5)
HEMATOCRIT: 18.9 % — AB (ref 36.0–46.0)
Hemoglobin: 5.4 g/dL — CL (ref 12.0–15.0)
Immature Granulocytes: 2 %
LYMPHS ABS: 1.4 10*3/uL (ref 0.7–4.0)
Lymphocytes Relative: 22 %
MCH: 24 pg — ABNORMAL LOW (ref 26.0–34.0)
MCHC: 28.6 g/dL — AB (ref 30.0–36.0)
MCV: 84 fL (ref 80.0–100.0)
Monocytes Absolute: 0.6 10*3/uL (ref 0.1–1.0)
Monocytes Relative: 10 %
NRBC: 1.7 % — AB (ref 0.0–0.2)
Neutro Abs: 4.2 10*3/uL (ref 1.7–7.7)
Neutrophils Relative %: 65 %
Platelets: 149 10*3/uL — ABNORMAL LOW (ref 150–400)
RBC: 2.25 MIL/uL — AB (ref 3.87–5.11)
RDW: 14.8 % (ref 11.5–15.5)
WBC: 6.4 10*3/uL (ref 4.0–10.5)

## 2018-01-22 LAB — MAGNESIUM: Magnesium: 2 mg/dL (ref 1.7–2.4)

## 2018-01-22 LAB — PHOSPHORUS: Phosphorus: 3.3 mg/dL (ref 2.5–4.6)

## 2018-01-22 LAB — I-STAT TROPONIN, ED: TROPONIN I, POC: 0.03 ng/mL (ref 0.00–0.08)

## 2018-01-22 LAB — TSH: TSH: 2.041 u[IU]/mL (ref 0.350–4.500)

## 2018-01-22 LAB — POC OCCULT BLOOD, ED: Fecal Occult Bld: POSITIVE — AB

## 2018-01-22 LAB — SALICYLATE LEVEL: Salicylate Lvl: <7 mg/dL (ref 2.8–30.0)

## 2018-01-22 LAB — CK: CK TOTAL: 1914 U/L — AB (ref 38–234)

## 2018-01-22 MED ORDER — FENTANYL CITRATE (PF) 100 MCG/2ML IJ SOLN
25.0000 ug | Freq: Once | INTRAMUSCULAR | Status: AC
  Administered 2018-01-22: 25 ug via INTRAVENOUS
  Filled 2018-01-22: qty 2

## 2018-01-22 MED ORDER — BISACODYL 5 MG PO TBEC: 5.0000 mg | DELAYED_RELEASE_TABLET | Freq: Every day | ORAL | Status: DC | PRN

## 2018-01-22 MED ORDER — ACETAMINOPHEN 325 MG PO TABS
650.0000 mg | ORAL_TABLET | Freq: Four times a day (QID) | ORAL | Status: DC | PRN
  Filled 2018-01-22: qty 2

## 2018-01-22 MED ORDER — PANTOPRAZOLE SODIUM 40 MG IV SOLR: 40.0000 mg | Freq: Two times a day (BID) | INTRAVENOUS | Status: DC

## 2018-01-22 MED ORDER — SODIUM CHLORIDE 0.9 % IV BOLUS
1000.0000 mL | Freq: Once | INTRAVENOUS | Status: AC
  Administered 2018-01-22: 1000 mL via INTRAVENOUS

## 2018-01-22 MED ORDER — ONDANSETRON HCL 4 MG/2ML IJ SOLN
4.0000 mg | Freq: Four times a day (QID) | INTRAMUSCULAR | Status: DC | PRN
  Administered 2018-01-23: 4 mg via INTRAVENOUS

## 2018-01-22 MED ORDER — KCL IN DEXTROSE-NACL 20-5-0.9 MEQ/L-%-% IV SOLN
INTRAVENOUS | Status: DC
  Administered 2018-01-22 – 2018-01-23 (×2): via INTRAVENOUS
  Filled 2018-01-22 (×2): qty 1000

## 2018-01-22 MED ORDER — SODIUM CHLORIDE 0.9 % IV SOLN
8.0000 mg/h | INTRAVENOUS | Status: DC
  Administered 2018-01-23: 8 mg/h via INTRAVENOUS
  Filled 2018-01-22 (×4): qty 80

## 2018-01-22 MED ORDER — ONDANSETRON HCL 4 MG PO TABS: 4.0000 mg | ORAL_TABLET | Freq: Four times a day (QID) | ORAL | Status: DC | PRN

## 2018-01-22 MED ORDER — SODIUM CHLORIDE 0.9% IV SOLUTION
Freq: Once | INTRAVENOUS | Status: AC
  Administered 2018-01-22: 20:00:00 via INTRAVENOUS

## 2018-01-22 MED ORDER — HYDROMORPHONE HCL 1 MG/ML IJ SOLN
0.5000 mg | INTRAMUSCULAR | Status: DC | PRN
  Administered 2018-01-22 – 2018-01-25 (×5): 0.5 mg via INTRAVENOUS
  Filled 2018-01-22 (×5): qty 0.5

## 2018-01-22 MED ORDER — HYDROCODONE-ACETAMINOPHEN 5-325 MG PO TABS
1.0000 | ORAL_TABLET | Freq: Four times a day (QID) | ORAL | Status: DC | PRN
  Administered 2018-01-23 – 2018-01-25 (×4): 1 via ORAL
  Filled 2018-01-22 (×4): qty 1

## 2018-01-22 MED ORDER — ACETAMINOPHEN 650 MG RE SUPP: 650.0000 mg | Freq: Four times a day (QID) | RECTAL | Status: DC | PRN

## 2018-01-22 MED ORDER — SENNOSIDES-DOCUSATE SODIUM 8.6-50 MG PO TABS: 1.0000 | ORAL_TABLET | Freq: Every evening | ORAL | Status: DC | PRN

## 2018-01-22 MED ORDER — SODIUM CHLORIDE 0.9 % IV SOLN
80.0000 mg | Freq: Once | INTRAVENOUS | Status: AC
  Administered 2018-01-23: 80 mg via INTRAVENOUS
  Filled 2018-01-22: qty 80

## 2018-01-22 NOTE — Progress Notes (Signed)
Patient ID: Suzanne Kim Radebaugh, female   DOB: Jan 08, 1941, 78 y.o.   MRN: [id number] H&P        History and Physical    Suzanne Kim Rufus WUJ:[id number] DOB: Jan 08, 1941 DOA: 01/22/2018  PCP: [facility]  Patient coming from: Home  I have personally briefly reviewed patient's [facility]  Chief Complaint: Weakness  HPI: Suzanne Kim Macmaster is a 78 y.o. female with medical history significant of multiple sclerosis presents with weakness.  Over the past week patient has had progressive weakness from her baseline.  At baseline she is able to stand and aid with her care.  She has been unable to stand over the past week.  She does have chronic lower extremity weakness worse on the right from her multiple sclerosis.  She has had worsening muscle cramps that she has been taking aspirin /back pain relief medication.  Patient does have a history of upper GI bleeding back in 2018.  She states that the aspirin is the only medication really helps her discomfort.  Patient was found to be anemic with a hemoglobin of 5.  Patient had [facility] that showed friable gastric mucosa.  During that admission she had a hemoglobin of 7.9 and received 1 unit of blood.  Upon discharge her hemoglobin was 10.  Patient was hemodynamically stable here.  She was heme positive.  She denies any abdominal discomfort nausea vomiting.  She has been suffering with upper respiratory infection of the past week.  She was seen by a home doctor who did some labs and did a chest x-ray. She  Did not hear any abnormal results back.  ED Course: Patient accepted from Dr. Rush Landmarkegeler. patient was ordered 2 units of packed red blood cells.  Patient was also found to have a CK of 1914.  She was given IV fluids and given some IV fentanyl for her pain.  Review of Systems: Denies chest pain shortness of abdominal pain melena nausea vomiting positive for weakness and cough all others reviewed with patient  and  are  negative unless otherwise stated   Past Medical History:  Diagnosis Date  . Hiatal hernia   . Multiple sclerosis (HCC) 1988    Past Surgical History:  Procedure Laterality Date  . BREAST ENHANCEMENT SURGERY  1975  . ESOPHAGOGASTRODUODENOSCOPY (EGD) WITH PROPOFOL N/A 05/12/2016   Procedure: ESOPHAGOGASTRODUODENOSCOPY (EGD) WITH PROPOFOL;  Surgeon: Bernette Redbirdobert Buccini, MD;  Location: [facility] END[facility];  Service: Endoscopy;  Laterality: N/A;  . TONSILLECTOMY    . TOTAL VAGINAL HYSTERECTOMY    . WISDOM TOOTH EXTRACTION       reports that she has never smoked. She has never used smokeless tobacco. She reports that she does not drink alcohol or use drugs. She lives at home she does have home care aide and her next-door neighbor helps take care of her she does not have any children No Known Allergies  Family History  Problem Relation Age of Onset  . Heart failure Father   . Ataxia Neg Hx   . Chorea Neg Hx   . Dementia Neg Hx   . Mental retardation Neg Hx   . Migraines Neg Hx   . Multiple sclerosis Neg Hx   . Neurofibromatosis Neg Hx   . Neuropathy Neg Hx   . Parkinsonism Neg Hx   . Seizures Neg Hx   . Stroke Neg Hx     Prior to Admission medications   Medication Sig  Start Date End Date Taking? Authorizing Provider  Acetaminophen (CHLORASEPTIC SORE THROAT PO) Take 1 lozenge by mouth daily as needed (cold symptoms).   Yes [provider]  acetaminophen (TYLENOL) 500 MG tablet Take 500 mg by mouth daily as needed for mild pain, moderate pain, fever or headache.    Yes [provider]  benzonatate (TESSALON) 100 MG capsule Take 100 mg by mouth 2 (two) times daily as needed for cough.   Yes [provider]  Chlorpheniramine-Acetaminophen (CORICIDIN HBP COLD/FLU PO) Take 2 tablets by mouth daily as needed (cold symptoms).   Yes [provider]  gabapentin (NEURONTIN) 100 MG capsule Take 200 mg by mouth 2 (two) times daily.    Yes [provider]    guaiFENesin (MUCINEX) 600 MG 12 hr tablet Take 600 mg by mouth daily as needed for cough or to loosen phlegm.   Yes [provider]  meclizine (ANTIVERT) 25 MG tablet Take 25 mg by mouth 2 (two) times daily as needed for dizziness.   Yes [provider]  methocarbamol (ROBAXIN) 750 MG tablet Take 900 mg by mouth 2 (two) times daily.   Yes [provider]  omeprazole (PRILOSEC) 20 MG capsule TAKE 1 CAPSULE DAILY 08/01/16  Yes Everlena Cooper, Adam R, DO  raloxifene (EVISTA) 60 MG tablet Take 60 mg by mouth daily.    Yes [provider]  vitamin E 400 UNIT capsule Take 400 Units by mouth daily.   Yes [provider]    Physical Exam: Vitals:   01/22/18 1930 01/22/18 2000 01/22/18 2011 01/22/18 2032  BP: 128/68 (!) 116/58 (!) 116/58 (!) 120/57  Pulse: 96 89 90 89  Resp: 20 16 16 17   Temp:   98.2 F (36.8 Kim) 98.4 F (36.9 Kim)  TempSrc:   Oral Oral  SpO2: 95% 98%    Weight:      Height:        Constitutional: NAD, calm, comfortable Vitals:   01/22/18 1930 01/22/18 2000 01/22/18 2011 01/22/18 2032  BP: 128/68 (!) 116/58 (!) 116/58 (!) 120/57  Pulse: 96 89 90 89  Resp: 20 16 16 17   Temp:   98.2 F (36.8 Kim) 98.4 F (36.9 Kim)  TempSrc:   Oral Oral  SpO2: 95% 98%    Weight:      Height:       Eyes: PERRL, lids and conjunctivae normal ENMT: Mucous membranes are moist. Posterior pharynx clear of any exudate or lesions.Normal dentition.  Neck: normal, supple,  Respiratory: clear to auscultation bilaterally, no wheezing, no crackles. Normal respiratory effort. No accessory muscle use.  Cardiovascular: Regular rate and rhythm, no murmurs / rubs / gallops. No extremity edema. 2+ pedal pulses. Abdomen: no tenderness, no masses palpated.  Bowel sounds positive.  Musculoskeletal: no clubbing / cyanosis. No joint deformity upper and lower extremities.  Fair muscle tone skin: no rashes, lesions, ulcers. No induration Neurologic: CN 2-12 grossly intact.  Right  lower extremity 3 out of 5 strength left lower extremity 3+ out of 5 strength upper extremities 4 out of 5 strength bilaterally  psychiatric: Normal judgment and insight. Alert and oriented x 3. Normal mood.     Labs on Admission: I have personally reviewed following labs and imaging studies  CBC: Recent Labs  Lab 01/22/18 1630  WBC 6.4  NEUTROABS 4.2  HGB 5.4*  HCT 18.9*  MCV 84.0  PLT 149*   Basic Metabolic Panel: Recent Labs  Lab 01/22/18 1630  NA 142  K 3.6  CL 116*  CO2 21*  GLUCOSE 102*  BUN 22  CREATININE 0.88  CALCIUM 8.1*  MG 2.0  PHOS 3.3   GFR: Estimated Creatinine Clearance: 42.8 mL/min (by Kim-G formula based on SCr of 0.88 mg/dL). Liver Function Tests: Recent Labs  Lab 01/22/18 1630  AST 105*  ALT 52*  ALKPHOS 57  BILITOT 0.6  PROT 5.4*  ALBUMIN 2.9*   No results for input(s): LIPASE, AMYLASE in the last 168 hours. No results for input(s): AMMONIA in the last 168 hours. Coagulation Profile: No results for input(s): INR, PROTIME in the last 168 hours. Cardiac Enzymes: Recent Labs  Lab 01/22/18 1630  CKTOTAL 1,914*   BNP (last 3 results) No results for input(s): PROBNP in the last 8760 hours. HbA1C: No results for input(s): HGBA1C in the last 72 hours. CBG: No results for input(s): GLUCAP in the last 168 hours. Lipid Profile: No results for input(s): CHOL, HDL, LDLCALC, TRIG, CHOLHDL, LDLDIRECT in the last 72 hours. Thyroid Function Tests: Recent Labs    01/22/18 1630  TSH 2.041   Anemia Panel: No results for input(s): VITAMINB12, FOLATE, FERRITIN, TIBC, IRON, RETICCTPCT in the last 72 hours. Urine analysis:    Component Value Date/Time   COLORURINE YELLOW 09/15/2016 1140   APPEARANCEUR CLEAR 09/15/2016 1140   LABSPEC 1.023 09/15/2016 1140   PHURINE 5.0 09/15/2016 1140   GLUCOSEU NEGATIVE 09/15/2016 1140   HGBUR MODERATE (A) 09/15/2016 1140   BILIRUBINUR NEGATIVE 09/15/2016 1140   KETONESUR 20 (A) 09/15/2016 1140   PROTEINUR  30 (A) 09/15/2016 1140   NITRITE NEGATIVE 09/15/2016 1140   LEUKOCYTESUR NEGATIVE 09/15/2016 1140    Radiological Exams on Admission: Dg Chest 2 View  Result Date: 01/22/2018 CLINICAL DATA:  Shortness of breath with cough EXAM: CHEST - 2 VIEW COMPARISON:  None. FINDINGS: Scattered calcifications are noted bilaterally, likely residua of prior granulomatous disease. No evident edema or consolidation. Heart size and pulmonary vascularity are normal. No adenopathy. There is aortic atherosclerosis. Bones appear osteoporotic. IMPRESSION: Apparent granulomatous disease with multiple foci of calcification. No frank edema or consolidation. Heart size within normal limits. There is aortic atherosclerosis. Bones osteoporotic. Aortic Atherosclerosis (ICD10-I70.0). Electronically Signed   By: Bretta BangWilliam  Woodruff III M.D.   On: 01/22/2018 16:04    EKG: Independently reviewed.  Sinus rhythm right bundle branch block  Assessment/Plan Principal Problem:   Acute posthemorrhagic anemia GI bleed Active Problems:   Anemia   Rhabdomyolysis associated mild transaminitis   Thrombocytopenia (HCC)   Osteoporosis Multiple sclerosis  1.  Admit to telemetry bed n.p.o., IV Protonix, GI consult, serial H/H.  D/Kim NSAIDs 2.  2 units packed white blood cells ordered in the ED.  Follow  H/H 3.  No obvious reason for patient's elevated CK other than her worsening ambulatory  status over the past week.  Continue IV fluids repeat CK and LFTs inn a.m. 4.  Patient has HX chronic thrombocytopenia  Though her platelets were normal in August of this year.  Follow 5.  Restart home medications for chronic medical issues when safe to do so.  Currently not on any meds for her multiple sclerosis.    DVT prophylaxis:S CDs Code Status: DNR  Disposition Plan: STR vs home 2-3 days   Consults called: GI  Dr Bosie ClosSchooler  Admission status: IP tele, it is my clinical opinion that admission to INPATIENT is reasonable and necessary because of  the expectation that this patient will require hospital care that crosses at least 2  midnights to treat this condition based on the medical complexity of the problems presented.  Given the aforementioned information, the predictability of an adverse outcome is felt to be significant    Synetta Fail MD [facility] Pager [phone]  If 7PM-7AM, please contact night-coverage [email or web address] Password [email or web address]  01/22/2018, 8:59 PM

## 2018-01-22 NOTE — ED Notes (Signed)
Patient transported to X-ray 

## 2018-01-22 NOTE — ED Notes (Signed)
Date and time results received: 01/22/18 1708 (use smartphrase ".now" to insert current time)  Test: Hgb Critical Value: 5.4  Name of Provider Notified: Dr Rush Landmark  Orders Received? Or Actions Taken?: Actions Taken: notified Hgb 5.4 to Dr Rush Landmark

## 2018-01-22 NOTE — ED Provider Notes (Signed)
[facility] DEPT Provider Note   CSN: [id number] Arrival date & time: 01/22/18  1431     History   Chief Complaint Chief Complaint  Patient presents with  . Spasms    HPI Suzanne Kim is a 78 y.o. female.  The history is provided by the patient, a caregiver and medical records. No language interpreter was used.  Illness  Location:  All over weakness and fatigue, muscle spasms Severity:  Severe Onset quality:  Gradual Duration:  1 week Timing:  Constant Progression:  Worsening Chronicity:  New Associated symptoms: congestion, cough, fatigue, myalgias and shortness of breath   Associated symptoms: no abdominal pain, no chest pain, no diarrhea, no fever, no headaches, no loss of consciousness, no nausea, no rash, no rhinorrhea, no sore throat, no vomiting and no wheezing     Past Medical History:  Diagnosis Date  . Hiatal hernia   . Multiple sclerosis ([place]) 1988    Patient Active Problem List   Diagnosis Date Noted  . GI bleed 05/11/2016  . Muscle spasm of right lower extremity 10/14/2013  . Overactive bladder 10/14/2013  . Hip pain, bilateral 10/14/2013  . Bilateral low back pain without sciatica 10/14/2013  . Secondary progressive multiple sclerosis ([place]) 04/09/2013    Past Surgical History:  Procedure Laterality Date  . BREAST ENHANCEMENT SURGERY  1975  . ESOPHAGOGASTRODUODENOSCOPY (EGD) WITH PROPOFOL N/A 05/12/2016   Procedure: ESOPHAGOGASTRODUODENOSCOPY (EGD) WITH PROPOFOL;  Surgeon: Ronald Lobo, MD;  Location: [facility];  Service: Endoscopy;  Laterality: N/A;  . TONSILLECTOMY    . TOTAL VAGINAL HYSTERECTOMY    . WISDOM TOOTH EXTRACTION       OB History   No obstetric history on file.      Home Medications    Prior to Admission medications   Medication Sig Start Date End Date Taking? Authorizing Provider  acetaminophen (TYLENOL) 500 MG tablet Take 1,000 mg by mouth every 6 (six) hours as needed for mild pain,  moderate pain, fever or headache.    [provider]  Cholecalciferol (VITAMIN D) 2000 units tablet Take 4,000 Units by mouth daily.    [provider]  [employer] COENZYME Q-10 100 MG capsule Take 100 mg by mouth daily. 03/28/17   [provider]  FLUoxetine (PROZAC) 10 MG capsule Take 10 mg by mouth at bedtime. 02/15/17   [provider]  gabapentin (NEURONTIN) 100 MG capsule Take 100-200 mg by mouth 2 (two) times daily. Takes 100 mg in the morning and 200 mg at bedtime    [provider]  Garlic Oil 633 MG CAPS Take 1 capsule by mouth daily. 03/30/17   [provider]  meclizine (ANTIVERT) 25 MG tablet Take 25 mg by mouth 2 (two) times daily as needed for dizziness.    [provider]  omeprazole (PRILOSEC) 20 MG capsule TAKE 1 CAPSULE DAILY 08/01/16   Pieter Partridge, DO  raloxifene (EVISTA) 60 MG tablet Take 60 mg by mouth daily.     [provider]  vitamin B-12 (CYANOCOBALAMIN) 100 MCG tablet Take 100 mcg by mouth daily. 02/24/17   [provider]    Family History Family History  Problem Relation Age of Onset  . Heart failure Father   . Ataxia Neg Hx   . Chorea Neg Hx   . Dementia Neg Hx   . Mental retardation Neg Hx   . Migraines Neg Hx   . Multiple sclerosis Neg Hx   . Neurofibromatosis  Neg Hx   . Neuropathy Neg Hx   . Parkinsonism Neg Hx   . Seizures Neg Hx   . Stroke Neg Hx     Social History Social History   Tobacco Use  . Smoking status: Never Smoker  . Smokeless tobacco: Never Used  Substance Use Topics  . Alcohol use: No    Alcohol/week: 0.0 standard drinks  . Drug use: No     Allergies   Patient has no known allergies.   Review of Systems Review of Systems  Constitutional: Positive for fatigue. Negative for chills, diaphoresis and fever.  HENT: Positive for congestion. Negative for rhinorrhea and sore throat.   Eyes: Negative for visual disturbance.  Respiratory: Positive for cough  and shortness of breath. Negative for chest tightness, wheezing and stridor.   Cardiovascular: Negative for chest pain and palpitations.  Gastrointestinal: Negative for abdominal pain, diarrhea, nausea and vomiting.  Genitourinary: Negative for dysuria and flank pain.  Musculoskeletal: Positive for gait problem and myalgias. Negative for back pain and neck stiffness.  Skin: Negative for rash and wound.  Neurological: Positive for weakness. Negative for dizziness, loss of consciousness, light-headedness, numbness and headaches.  Psychiatric/Behavioral: Negative for agitation.  All other systems reviewed and are negative.    Physical Exam Updated Vital Signs BP (!) 122/35   Pulse 87   Temp (!) 97.4 F (36.3 C) (Oral)   Resp 16   Ht _0  (1.626 m)   Wt 49.9 kg   SpO2 98%   BMI 18.88 kg/m   Physical Exam Vitals signs and nursing note reviewed.  Constitutional:      General: She is not in acute distress.    Appearance: She is well-developed. She is not ill-appearing, toxic-appearing or diaphoretic.  HENT:     Head: Normocephalic and atraumatic.     Nose: Congestion present. No rhinorrhea.     Mouth/Throat:     Pharynx: No oropharyngeal exudate or posterior oropharyngeal erythema.  Eyes:     Extraocular Movements: Extraocular movements intact.     Conjunctiva/sclera: Conjunctivae normal.     Pupils: Pupils are equal, round, and reactive to light.  Neck:     Musculoskeletal: Neck supple.  Cardiovascular:     Rate and Rhythm: Normal rate and regular rhythm.     Heart sounds: No murmur.  Pulmonary:     Effort: Pulmonary effort is normal. No respiratory distress.     Breath sounds: Rhonchi (mild in bases) present. No wheezing or rales.  Chest:     Chest wall: No tenderness.  Abdominal:     Palpations: Abdomen is soft.     Tenderness: There is no abdominal tenderness. There is no right CVA tenderness or left CVA tenderness.  Skin:    General: Skin is warm and dry.    Neurological:     Mental Status: She is alert and oriented to person, place, and time.     Sensory: No sensory deficit.     Motor: Weakness (in extremities with lifting from bed) present.  Psychiatric:        Mood and Affect: Mood normal.      ED Treatments / Results  Labs (all labs ordered are listed, but only abnormal results are displayed) Labs Reviewed  CK - Abnormal; Notable for the following components:      Result Value   Total CK 1,914 (*)    All other components within normal limits  CBC WITH DIFFERENTIAL/PLATELET - Abnormal; Notable for the following components:  RBC 2.25 (*)    Hemoglobin 5.4 (*)    HCT 18.9 (*)    MCH 24.0 (*)    MCHC 28.6 (*)    Platelets 149 (*)    nRBC 1.7 (*)    Abs Immature Granulocytes 0.12 (*)    All other components within normal limits  COMPREHENSIVE METABOLIC PANEL - Abnormal; Notable for the following components:   Chloride 116 (*)    CO2 21 (*)    Glucose, Bld 102 (*)    Calcium 8.1 (*)    Total Protein 5.4 (*)    Albumin 2.9 (*)    AST 105 (*)    ALT 52 (*)    All other components within normal limits  BRAIN NATRIURETIC PEPTIDE - Abnormal; Notable for the following components:   B Natriuretic Peptide 461.5 (*)    All other components within normal limits  POC OCCULT BLOOD, ED - Abnormal; Notable for the following components:   Fecal Occult Bld POSITIVE (*)    All other components within normal limits  URINE CULTURE  MAGNESIUM  TSH  SALICYLATE LEVEL  PHOSPHORUS  URINALYSIS, ROUTINE W REFLEX MICROSCOPIC  CK  COMPREHENSIVE METABOLIC PANEL  HEMATOCRIT  HEMATOCRIT  HEMOGLOBIN  HEMOGLOBIN  HEMATOCRIT  HEMATOCRIT  HEMOGLOBIN  HEMOGLOBIN  I-STAT TROPONIN, ED  TYPE AND SCREEN  PREPARE RBC (CROSSMATCH)    EKG EKG Interpretation  Date/Time:  Monday January 22 2018 16:26:46 EST Ventricular Rate:  87 PR Interval:    QRS Duration: 150 QT Interval:  406 QTC Calculation: 489 R Axis:   87 Text Interpretation:   Sinus rhythm Right bundle branch block No prior ECG for comaparison.  No STEMI Confirmed by Antony Blackbird [id number]) on 01/22/2018 5:02:30 PM   Radiology Dg Chest 2 View  Result Date: 01/22/2018 CLINICAL DATA:  Shortness of breath with cough EXAM: CHEST - 2 VIEW COMPARISON:  None. FINDINGS: Scattered calcifications are noted bilaterally, likely residua of prior granulomatous disease. No evident edema or consolidation. Heart size and pulmonary vascularity are normal. No adenopathy. There is aortic atherosclerosis. Bones appear osteoporotic. IMPRESSION: Apparent granulomatous disease with multiple foci of calcification. No frank edema or consolidation. Heart size within normal limits. There is aortic atherosclerosis. Bones osteoporotic. Aortic Atherosclerosis (ICD10-I70.0). Electronically Signed   By: Lowella Grip III M.D.   On: 01/22/2018 16:04    Procedures Procedures (including critical care time)  CRITICAL CARE Performed by: Gwenyth Allegra Maurisio Ruddy Total critical care time: 35 minutes Critical care time was exclusive of separately billable procedures and treating other patients. Critical care was necessary to treat or prevent imminent or life-threatening deterioration. Critical care was time spent personally by me on the following activities: development of treatment plan with patient and/or surrogate as well as nursing, discussions with consultants, evaluation of patient's response to treatment, examination of patient, obtaining history from patient or surrogate, ordering and performing treatments and interventions, ordering and review of laboratory studies, ordering and review of radiographic studies, pulse oximetry and re-evaluation of patient's condition.   Medications Ordered in ED Medications  dextrose 5 % and 0.9 % NaCl with KCl 20 mEq/L infusion ( Intravenous New Bag/Given 01/22/18 2244)  acetaminophen (TYLENOL) tablet 650 mg (has no administration in time range)    Or  acetaminophen  (TYLENOL) suppository 650 mg (has no administration in time range)  HYDROcodone-acetaminophen (NORCO/VICODIN) 5-325 MG per tablet 1 tablet (has no administration in time range)  bisacodyl (DULCOLAX) EC tablet 5 mg (has no administration in time range)  senna-docusate (Senokot-S) tablet 1 tablet (has no administration in time range)  ondansetron (ZOFRAN) tablet 4 mg (has no administration in time range)    Or  ondansetron (ZOFRAN) injection 4 mg (has no administration in time range)  pantoprazole (PROTONIX) 80 mg in sodium chloride 0.9 % 100 mL IVPB (has no administration in time range)  pantoprazole (PROTONIX) 80 mg in sodium chloride 0.9 % 250 mL (0.32 mg/mL) infusion (has no administration in time range)  pantoprazole (PROTONIX) injection 40 mg (has no administration in time range)  HYDROmorphone (DILAUDID) injection 0.5 mg (0.5 mg Intravenous Given 01/22/18 2207)  fentaNYL (SUBLIMAZE) injection 25 mcg (25 mcg Intravenous Given 01/22/18 1613)  sodium chloride 0.9 % bolus 1,000 mL (0 mLs Intravenous Stopped 01/22/18 2010)  0.9 %  sodium chloride infusion (Manually program via Guardrails IV Fluids) ( Intravenous New Bag/Given 01/22/18 2020)     Initial Impression / Assessment and Plan / ED Course  I have reviewed the triage vital signs and the nursing notes.  Pertinent labs & imaging results that were available during my care of the patient were reviewed by me and considered in my medical decision making (see chart for details).     Suzanne Kim is a 78 y.o. female with a past medical history significant for multiple sclerosis who presents with new fatigue, total body weakness, and severe muscle spasms.  She reports that she has had MS for over 60 years and says this does not feel like an MS flare.  She says that she is normally able to walk and get around by herself without any assistance until last Monday when she had onset of muscle weakness.  She says that she has been unable to ambulate at  all in the last week and has had to use assistance at home.  She has 3 hours of assistance with a caregiver daily and says that she has not been able to get up, get around, get to the bathroom or do other needed things.  She says that the pain is extremely severe in her arms and legs primarily.  She reports that she has had muscle spasms before but have never been this bad.  She describes the pain is severe.  She denies any nausea, vomiting, urinary symptoms or GI symptoms.  She does report that she has had some cough and cold symptoms for the last few weeks.  She reports that a home care doctor came to her house and did a chest x-ray last week but she does not know what the results were.  She was not told that she has a pneumonia.  She reports she still having the cough.  As the patient has not been able to ambulate in a week, patient decided to come get evaluated.  On exam, patient has no numbness in extremities.  She has a weak grip strength bilaterally but is symmetric.  Patient has difficulty lifting her legs off the bed bilaterally.  Normal pulses in all extremities.  Lungs had some fine coarseness in the bases but otherwise clear.  No murmur.  No facial droop.  Patient clear speech.  She denied double vision or blurry vision.  No ptosis was seen.  Normal extraocular movements.  Abdomen was nontender.  Mucous membranes were dry on exam.  Patient says she is not eating or drinking as well due to the inability to ambulate and her severe pain.  Patient is unsure if she is dehydrated or not.  Patient will work-up to  look for etiology other than her MS.  Given the arm and leg weakness being the primary sources of her problem, polymyositis considered. However, She will have work-up to look for dehydration, electrolyte imbalance, or occult infection.  Patient be given a small amount of pain medication as well as some fluids.  5:39 PM Labs began to return showing a hemoglobin of 5.4.  The significant decrease  from last value greater than 10.  Suspect symptomatic anemia as cause of symptoms.  Patient does report that she has been using multiple doses of "Bayer aspirin back and body" pills as it is doing it has helped her discomfort.  She does state she is a history of ulcer and GI bleed in the past.  Suspect this is likely source.  Fecal occult be obtained however patient be given 2 units of blood.  CK is elevated initially.  Fecal occult was positive.  Suspect GI source.  Patient will be admitted for symptomatic anemia.  Patient was given 2 units of blood initially.  Patient met for further management.  Final Clinical Impressions(s) / ED Diagnoses   Final diagnoses:  Muscle spasm  Symptomatic anemia    ED Discharge Orders    None     Clinical Impression: 1. Muscle spasm   2. Symptomatic anemia     Disposition: Admit  This note was prepared with assistance of Dragon voice recognition software. Occasional wrong-word or sound-a-like substitutions may have occurred due to the inherent limitations of voice recognition software.     Samaa Ueda, Gwenyth Allegra, MD 01/23/18 [phone]

## 2018-01-22 NOTE — ED Notes (Signed)
ED TO INPATIENT HANDOFF REPORT  Name/Age/Gender Fanny Skatesianna C Widener 78 y.o. female  Code Status Code Status History    Date Active Date Inactive Code Status Order ID Comments User Context   05/11/2016 2243 05/16/2016 1724 Full Code [id number]  Dorothea OgleMyers, Iskra M, MD Inpatient      Home/SNF/Other Given to floor  Chief Complaint leg pain   Level of Care/Admitting Diagnosis ED Disposition    ED Disposition Condition Comment   Admit  [facility] [[id number]]  Level of Care: Telemetry [5]  Admit to tele based on following criteria: Other see comments  Comments: GIB  Diagnosis: Anemia [[id number]][[id number]]  Admitting Physician: Synetta FailJOHNSON-PITTS, ENDIA [[id number]]  Attending Physician: Synetta FailJOHNSON-PITTS, ENDIA [[id number]]  Estimated length of stay: past midnight tomorrow  Certification:: I certify this patient will need inpatient services for at least 2 midnights  PT Class (Do Not Modify): Inpatient [101]  PT Acc Code (Do Not Modify): Private [1]       Medical History Past Medical History:  Diagnosis Date  . Hiatal hernia   . Multiple sclerosis ([facility]) 1988    Allergies No Known Allergies  IV Location/Drains/Wounds Patient Lines/Drains/Airways Status   Active Line/Drains/Airways    Name:   Placement date:   Placement time:   Site:   Days:   Peripheral IV 01/22/18 Left Antecubital   01/22/18    1629    Antecubital   less than 1   Peripheral IV 01/22/18 Right Forearm   01/22/18    1840    Forearm   less than 1          Labs/Imaging Results for orders placed or performed during the hospital encounter of 01/22/18 (from the past 48 hour(s))  Salicylate level     Status: None   Collection Time: 01/22/18  4:20 PM  Result Value Ref Range   Salicylate Lvl <7.0 2.8 - 30.0 mg/dL    Comment: Performed at [facility], [address] [place], [place] [address]  CK     Status: Abnormal   Collection Time: 01/22/18  4:30 PM  Result Value Ref Range   Total  CK 1,914 (H) 38 - 234 U/L    Comment: Performed at [facility], [address] [place], [place] [address]  Magnesium     Status: None   Collection Time: 01/22/18  4:30 PM  Result Value Ref Range   Magnesium 2.0 1.7 - 2.4 mg/dL    Comment: Performed at [facility], [address] [place], [place] [address]  CBC with Differential     Status: Abnormal   Collection Time: 01/22/18  4:30 PM  Result Value Ref Range   WBC 6.4 4.0 - 10.5 K/uL   RBC 2.25 (L) 3.87 - 5.11 MIL/uL   Hemoglobin 5.4 (LL) 12.0 - 15.0 g/dL    Comment: This critical result has verified and been called to JEFFEE,B. RN by Jani GravelKathleen Cohen on 01 06 2020 at 1706, and has been read back. CRITICAL RESULT VERIFIED   HCT 18.9 (L) 36.0 - 46.0 %   MCV 84.0 80.0 - 100.0 fL   MCH 24.0 (L) 26.0 - 34.0 pg   MCHC 28.6 (L) 30.0 - 36.0 g/dL   RDW 32.414.8 40.111.5 - 02.715.5 %   Platelets 149 (L) 150 - 400 K/uL   nRBC 1.7 (H) 0.0 - 0.2 %   Neutrophils Relative % 65 %   Neutro Abs 4.2 1.7 - 7.7 K/uL  Lymphocytes Relative 22 %   Lymphs Abs 1.4 0.7 - 4.0 K/uL   Monocytes Relative 10 %   Monocytes Absolute 0.6 0.1 - 1.0 K/uL   Eosinophils Relative 1 %   Eosinophils Absolute 0.1 0.0 - 0.5 K/uL   Basophils Relative 0 %   Basophils Absolute 0.0 0.0 - 0.1 K/uL   Immature Granulocytes 2 %   Abs Immature Granulocytes 0.12 (H) 0.00 - 0.07 K/uL    Comment: Performed at [facility], [address], [place], [place] [address]  Comprehensive metabolic panel     Status: Abnormal   Collection Time: 01/22/18  4:30 PM  Result Value Ref Range   Sodium 142 135 - 145 mmol/L   Potassium 3.6 3.5 - 5.1 mmol/L   Chloride 116 (H) 98 - 111 mmol/L   CO2 21 (L) 22 - 32 mmol/L   Glucose, Bld 102 (H) 70 - 99 mg/dL   BUN 22 8 - 23 mg/dL   Creatinine, Ser 7.03 0.44 - 1.00 mg/dL   Calcium 8.1 (L) 8.9 - 10.3 mg/dL   Total Protein 5.4 (L) 6.5 - 8.1 g/dL   Albumin 2.9 (L) 3.5 - 5.0 g/dL   AST 500 (H) 15 - 41  U/L   ALT 52 (H) 0 - 44 U/L   Alkaline Phosphatase 57 38 - 126 U/L   Total Bilirubin 0.6 0.3 - 1.2 mg/dL   GFR calc non Af Amer >60 >60 mL/min   GFR calc Af Amer >60 >60 mL/min   Anion gap 5 5 - 15    Comment: Performed at [facility], [address], [place], [place] [address]  I-Stat Troponin, ED (not at [facility])     Status: None   Collection Time: 01/22/18  4:30 PM  Result Value Ref Range   Troponin i, poc 0.03 0.00 - 0.08 ng/mL   Comment 3            Comment: Due to the release kinetics of cTnI, a negative result within the first hours of the onset of symptoms does not rule out myocardial infarction with certainty. If myocardial infarction is still suspected, repeat the test at appropriate intervals.   TSH     Status: None   Collection Time: 01/22/18  4:30 PM  Result Value Ref Range   TSH 2.041 0.350 - 4.500 uIU/mL    Comment: Performed by a 3rd Generation assay with a functional sensitivity of <=0.01 uIU/mL. Performed at [facility], [address] [place], [place] [address]   Brain natriuretic peptide     Status: Abnormal   Collection Time: 01/22/18  4:30 PM  Result Value Ref Range   B Natriuretic Peptide 461.5 (H) 0.0 - 100.0 pg/mL    Comment: Performed at [facility], [address] [place], [place] [address]  Phosphorus     Status: None   Collection Time: 01/22/18  4:30 PM  Result Value Ref Range   Phosphorus 3.3 2.5 - 4.6 mg/dL    Comment: Performed at [facility], [address], [place], [place] [address]  Type and screen [facility]     Status: None (Preliminary result)   Collection Time: 01/22/18  5:48 PM  Result Value Ref Range   ABO/RH(D) A POS    Antibody Screen NEG    Sample Expiration 01/25/2018    Unit Number [id number]    Blood Component Type RED CELLS,LR    Unit division 00  Status of Unit ALLOCATED    Transfusion Status OK TO TRANSFUSE     Crossmatch Result Compatible    Unit Number [id number]    Blood Component Type RED CELLS,LR    Unit division 00    Status of Unit ISSUED    Transfusion Status OK TO TRANSFUSE    Crossmatch Result      Compatible Performed at [facility], [address], [place], [place] [address]   Prepare RBC     Status: None   Collection Time: 01/22/18  5:48 PM  Result Value Ref Range   Order Confirmation      ORDER PROCESSED BY BLOOD BANK Performed at [facility], [address] [place], [place] [address]   POC occult blood, ED     Status: Abnormal   Collection Time: 01/22/18  8:10 PM  Result Value Ref Range   Fecal Occult Bld POSITIVE (A) NEGATIVE   Dg Chest 2 View  Result Date: 01/22/2018 CLINICAL DATA:  Shortness of breath with cough EXAM: CHEST - 2 VIEW COMPARISON:  None. FINDINGS: Scattered calcifications are noted bilaterally, likely residua of prior granulomatous disease. No evident edema or consolidation. Heart size and pulmonary vascularity are normal. No adenopathy. There is aortic atherosclerosis. Bones appear osteoporotic. IMPRESSION: Apparent granulomatous disease with multiple foci of calcification. No frank edema or consolidation. Heart size within normal limits. There is aortic atherosclerosis. Bones osteoporotic. Aortic Atherosclerosis (ICD10-I70.0). Electronically Signed   By: Bretta Bang III M.D.   On: 01/22/2018 16:04    Pending Labs Unresulted Labs (From admission, onward)    Start     Ordered   01/22/18 1524  Urinalysis, Routine w reflex microscopic  Once,   R     01/22/18 1525   01/22/18 1524  Urine culture  ONCE - STAT,   STAT     01/22/18 1525   Signed and Held  Hematocrit  3 times daily,   R     Signed and Held   Signed and Held  Hemoglobin  3 times daily,   R     Signed and Held          Vitals/Pain Today's Vitals   01/22/18 1930 01/22/18 2000 01/22/18 2011 01/22/18 2032  BP: 128/68 (!) 116/58 (!) 116/58 (!)  120/57  Pulse: 96 89 90 89  Resp: 20 16 16 17   Temp:   98.2 F (36.8 C) 98.4 F (36.9 C)  TempSrc:   Oral Oral  SpO2: 95% 98%    Weight:      Height:      PainSc:        Isolation Precautions No active isolations  Medications Medications  pantoprazole (PROTONIX) 80 mg in sodium chloride 0.9 % 100 mL IVPB (has no administration in time range)  pantoprazole (PROTONIX) 80 mg in sodium chloride 0.9 % 250 mL (0.32 mg/mL) infusion (has no administration in time range)  pantoprazole (PROTONIX) injection 40 mg (has no administration in time range)  HYDROmorphone (DILAUDID) injection 0.5 mg (has no administration in time range)  fentaNYL (SUBLIMAZE) injection 25 mcg (25 mcg Intravenous Given 01/22/18 1613)  sodium chloride 0.9 % bolus 1,000 mL (0 mLs Intravenous Stopped 01/22/18 2010)  0.9 %  sodium chloride infusion (Manually program via Guardrails IV Fluids) ( Intravenous New Bag/Given 01/22/18 2020)    Mobility non-ambulatory

## 2018-01-22 NOTE — ED Triage Notes (Signed)
Pt has MS. Pt is usually able to get around by herself. Last Monday pt woke up had extra weakness and muscle spasms.  Pt states pain and frequency is at its worst and has not let off at all. Otherwise patient is generally healthy.

## 2018-01-23 ENCOUNTER — Inpatient Hospital Stay (HOSPITAL_COMMUNITY): Payer: Medicare Other | Admitting: Anesthesiology

## 2018-01-23 ENCOUNTER — Inpatient Hospital Stay (HOSPITAL_COMMUNITY): Payer: Medicare Other

## 2018-01-23 ENCOUNTER — Encounter (HOSPITAL_COMMUNITY): Payer: Self-pay | Admitting: Anesthesiology

## 2018-01-23 ENCOUNTER — Other Ambulatory Visit: Payer: Self-pay

## 2018-01-23 ENCOUNTER — Encounter (HOSPITAL_COMMUNITY)
Admission: EM | Disposition: A | Discharge status: Skilled Nursing Facility | Payer: Self-pay | Source: Home / Self Care | Attending: Family Medicine

## 2018-01-23 DIAGNOSIS — D696 Thrombocytopenia, unspecified: Secondary | ICD-10-CM

## 2018-01-23 DIAGNOSIS — D649 Anemia, unspecified: Secondary | ICD-10-CM

## 2018-01-23 DIAGNOSIS — G35 Multiple sclerosis: Secondary | ICD-10-CM

## 2018-01-23 DIAGNOSIS — M6282 Rhabdomyolysis: Secondary | ICD-10-CM

## 2018-01-23 DIAGNOSIS — R195 Other fecal abnormalities: Secondary | ICD-10-CM

## 2018-01-23 DIAGNOSIS — K922 Gastrointestinal hemorrhage, unspecified: Secondary | ICD-10-CM

## 2018-01-23 DIAGNOSIS — D62 Acute posthemorrhagic anemia: Secondary | ICD-10-CM

## 2018-01-23 HISTORY — PX: ESOPHAGOGASTRODUODENOSCOPY (EGD) WITH PROPOFOL: SHX5813

## 2018-01-23 LAB — TYPE AND SCREEN
ABO/RH(D): A POS
Antibody Screen: NEGATIVE
UNIT DIVISION: 0
Unit division: 0

## 2018-01-23 LAB — COMPREHENSIVE METABOLIC PANEL
ALT: 48 U/L — ABNORMAL HIGH (ref 0–44)
AST: 87 U/L — ABNORMAL HIGH (ref 15–41)
Albumin: 2.7 g/dL — ABNORMAL LOW (ref 3.5–5.0)
Alkaline Phosphatase: 61 U/L (ref 38–126)
Anion gap: 6 (ref 5–15)
BUN: 13 mg/dL (ref 8–23)
CO2: 18 mmol/L — ABNORMAL LOW (ref 22–32)
Calcium: 7.5 mg/dL — ABNORMAL LOW (ref 8.9–10.3)
Chloride: 120 mmol/L — ABNORMAL HIGH (ref 98–111)
Creatinine, Ser: 0.79 mg/dL (ref 0.44–1.00)
GFR calc Af Amer: >60 mL/min (ref >60)
GFR calc non Af Amer: >60 mL/min (ref >60)
Glucose, Bld: 124 mg/dL — ABNORMAL HIGH (ref 70–99)
Potassium: 3.7 mmol/L (ref 3.5–5.1)
SODIUM: 144 mmol/L (ref 135–145)
Total Bilirubin: 0.6 mg/dL (ref 0.3–1.2)
Total Protein: 5 g/dL — ABNORMAL LOW (ref 6.5–8.1)

## 2018-01-23 LAB — BPAM RBC
BLOOD PRODUCT EXPIRATION DATE: 202001252359
Blood Product Expiration Date: 202001252359
ISSUE DATE / TIME: 202001061952
ISSUE DATE / TIME: 202001062244
UNIT TYPE AND RH: 6200
Unit Type and Rh: 6200

## 2018-01-23 LAB — HEMOGLOBIN: Hemoglobin: 9 g/dL — ABNORMAL LOW (ref 12.0–15.0)

## 2018-01-23 LAB — CK: Total CK: 1845 U/L — ABNORMAL HIGH (ref 38–234)

## 2018-01-23 LAB — HEMATOCRIT: HCT: 29.3 % — ABNORMAL LOW (ref 36.0–46.0)

## 2018-01-23 SURGERY — ESOPHAGOGASTRODUODENOSCOPY (EGD) WITH PROPOFOL
Anesthesia: Monitor Anesthesia Care

## 2018-01-23 MED ORDER — FUROSEMIDE 10 MG/ML IJ SOLN
40.0000 mg | Freq: Once | INTRAMUSCULAR | Status: AC
  Administered 2018-01-23: 40 mg via INTRAVENOUS
  Filled 2018-01-23: qty 4

## 2018-01-23 MED ORDER — PROPOFOL 10 MG/ML IV BOLUS
INTRAVENOUS | Status: AC
  Filled 2018-01-23: qty 20

## 2018-01-23 MED ORDER — GABAPENTIN 100 MG PO CAPS
200.0000 mg | ORAL_CAPSULE | Freq: Two times a day (BID) | ORAL | Status: DC
  Administered 2018-01-23 – 2018-01-25 (×5): 200 mg via ORAL
  Filled 2018-01-23 (×5): qty 2

## 2018-01-23 MED ORDER — LACTATED RINGERS IV SOLN
INTRAVENOUS | Status: DC
  Administered 2018-01-23: 13:00:00 via INTRAVENOUS

## 2018-01-23 MED ORDER — GUAIFENESIN ER 600 MG PO TB12: 600.0000 mg | ORAL_TABLET | Freq: Every day | ORAL | Status: DC | PRN

## 2018-01-23 MED ORDER — METHOCARBAMOL 500 MG PO TABS
750.0000 mg | ORAL_TABLET | Freq: Three times a day (TID) | ORAL | Status: DC
  Administered 2018-01-23 – 2018-01-25 (×7): 750 mg via ORAL
  Filled 2018-01-23 (×6): qty 2
  Filled 2018-01-23: qty 1.5

## 2018-01-23 MED ORDER — METHOCARBAMOL 750 MG PO TABS
900.0000 mg | ORAL_TABLET | Freq: Two times a day (BID) | ORAL | Status: DC
  Filled 2018-01-23: qty 0.5

## 2018-01-23 MED ORDER — PROPOFOL 500 MG/50ML IV EMUL
INTRAVENOUS | Status: DC | PRN
  Administered 2018-01-23 (×2): 20 mg via INTRAVENOUS

## 2018-01-23 MED ORDER — SODIUM CHLORIDE 0.9 % IV SOLN: INTRAVENOUS | Status: DC

## 2018-01-23 MED ORDER — PANTOPRAZOLE SODIUM 40 MG PO TBEC
40.0000 mg | DELAYED_RELEASE_TABLET | Freq: Every day | ORAL | Status: DC
  Administered 2018-01-24 – 2018-01-25 (×2): 40 mg via ORAL
  Filled 2018-01-23 (×2): qty 1

## 2018-01-23 MED ORDER — PROPOFOL 500 MG/50ML IV EMUL
INTRAVENOUS | Status: DC | PRN
  Administered 2018-01-23: 50 ug/kg/min via INTRAVENOUS

## 2018-01-23 SURGICAL SUPPLY — 15 items

## 2018-01-23 NOTE — Discharge Instructions (Signed)
Colonoscopy, Adult, Care After °This sheet gives you information about how to care for yourself after your procedure. Your doctor may also give you more specific instructions. If you have problems or questions, call your doctor. °What can I expect after the procedure? °After the procedure, it is common to have: °· A small amount of blood in your poop for 24 hours. °· Some gas. °· Mild cramping or bloating in your belly. °Follow these instructions at home: °General instructions °· For the first 24 hours after the procedure: °? Do not drive or use machinery. °? Do not sign important documents. °? Do not drink alcohol. °? Do your daily activities more slowly than normal. °? Eat foods that are soft and easy to digest. °· Take over-the-counter or prescription medicines only as told by your doctor. °To help cramping and bloating: ° °· Try walking around. °· Put heat on your belly (abdomen) as told by your doctor. Use a heat source that your doctor recommends, such as a moist heat pack or a heating pad. °? Put a towel between your skin and the heat source. °? Leave the heat on for 20-30 minutes. °? Remove the heat if your skin turns bright red. This is especially important if you cannot feel pain, heat, or cold. You can get burned. °Eating and drinking ° °· Drink enough fluid to keep your pee (urine) clear or pale yellow. °· Return to your normal diet as told by your doctor. Avoid heavy or fried foods that are hard to digest. °· Avoid drinking alcohol for as long as told by your doctor. °Contact a doctor if: °· You have blood in your poop (stool) 2-3 days after the procedure. °Get help right away if: °· You have more than a small amount of blood in your poop. °· You see large clumps of tissue (blood clots) in your poop. °· Your belly is swollen. °· You feel sick to your stomach (nauseous). °· You throw up (vomit). °· You have a fever. °· You have belly pain that gets worse, and medicine does not help your  pain. °Summary °· After the procedure, it is common to have a small amount of blood in your poop. You may also have mild cramping and bloating in your belly. °· For the first 24 hours after the procedure, do not drive or use machinery, do not sign important documents, and do not drink alcohol. °· Get help right away if you have a lot of blood in your poop, feel sick to your stomach, have a fever, or have more belly pain. °This information is not intended to replace advice given to you by your health care provider. Make sure you discuss any questions you have with your health care provider. °Document Released: 02/05/2010 Document Revised: 11/03/2016 Document Reviewed: 09/28/2015 °Elsevier Interactive Patient Education © 2019 [employer] ° °

## 2018-01-23 NOTE — Transfer of Care (Signed)
Immediate Anesthesia Transfer of Care Note  Patient: Suzanne Kim  Procedure(s) Performed: Procedure(s): ESOPHAGOGASTRODUODENOSCOPY (EGD) WITH PROPOFOL (N/A)  Patient Location: PACU  Anesthesia Type:MAC  Level of Consciousness:  sedated, patient cooperative and responds to stimulation  Airway & Oxygen Therapy:Patient Spontanous Breathing and Patient connected to face mask oxgen  Post-op Assessment:  Report given to PACU RN and Post -op Vital signs reviewed and stable  Post vital signs:  Reviewed and stable  Last Vitals:  Vitals:   01/23/18 0437 01/23/18 1233  BP: 140/63 (!) 157/55  Pulse: 81 93  Resp: 16 (!) 21  Temp: 36.8 C 37.8 C  SpO2: 85% 63%    Complications: No apparent anesthesia complications

## 2018-01-23 NOTE — Op Note (Addendum)
[facility] Patient Name: Suzanne LandauDianna Kim Procedure Date: 01/23/2018 MRN: [id number] Attending MD: Iva Booparl E Petra Sargeant , MD Date of Birth: 17-Nov-1940 CSN: [id number] Age: 78 Admit Type: Inpatient Procedure:                Upper GI endoscopy Indications:              Heme positive stool Providers:                Iva Booparl E. Braxston Quinter, MD, Dwain SarnaPatricia Ford, RN, Zoila ShutterGary                            Bryant, Technician, Greig RightLogan Key, CRNA Referring MD:              Medicines:                Propofol per Anesthesia, Monitored Anesthesia Care Complications:             Estimated Blood Loss:      Procedure:                Pre-Anesthesia Assessment:                           - Prior to the procedure, a History and Physical                            was performed, and patient medications and                            allergies were reviewed. The patient's tolerance of                            previous anesthesia was also reviewed. The risks                            and benefits of the procedure and the sedation                            options and risks were discussed with the patient.                            All questions were answered, and informed consent                            was obtained. Prior Anticoagulants: The patient has                            taken no previous anticoagulant or antiplatelet                            agents. ASA Grade Assessment: III - A patient with                            severe systemic disease. After reviewing the risks  and benefits, the patient was deemed in                            satisfactory condition to undergo the procedure.                           After obtaining informed consent, the endoscope was                            passed under direct vision. Throughout the                            procedure, the patient's blood pressure, pulse, and                            oxygen saturations were monitored  continuously. The                            GIF-H190 ([id number] Olympus adult endoscope was                            introduced through the mouth, and advanced to the                            second part of duodenum. The upper GI endoscopy was                            accomplished without difficulty. The patient                            tolerated the procedure well. Scope In: Scope Out: Findings:      A large hiatal hernia with a few Cameron ulcers was found.      The exam was otherwise without abnormality. Including gastric       retroflexion. Impression:               - Large hiatal hernia with a few Cameron ulcers.                           - The examination was otherwise normal.                           - No specimens collected. Moderate Sedation:      Not Applicable - Patient had care per Anesthesia. Recommendation:           - Return patient to hospital ward for ongoing care.                           - For completeness she would need a colonoscopy as                            I am not convinced we have explained her anemia and  heme + stool though it is possible the [facility]                            ulcers are responsible and she has had chronic                            blood loss anemia that has worsened. Hgb 10 and MCV                            79 08/2016.                           I think she needs some improvement in functional                            status before we undertake a colonoscopy, if she is                            willing to do one. She blanched whne I suggested it                            might be necessary. Procedure Code(s):        --- Professional ---                           [phone], Esophagogastroduodenoscopy, flexible,                            transoral; diagnostic, including collection of                            specimen(s) by brushing or washing, when performed                            (separate  procedure) Diagnosis Code(s):        --- Professional ---                           K44.9, Diaphragmatic hernia without obstruction or                            gangrene                           K25.9, Gastric ulcer, unspecified as acute or                            chronic, without hemorrhage or perforation                           R19.5, Other fecal abnormalities CPT copyright 2018 American Medical Association. All rights reserved. The codes documented in this report are preliminary and upon coder review may  be revised to meet current compliance requirements. Iva Booparl E Namari Breton, MD 01/23/2018 1:54:02 PM This report has been signed electronically. Number of Addenda: 0

## 2018-01-23 NOTE — H&P (Signed)
Patient ID: Suzanne Kim, female   DOB: [birth year], 78 y.o.   MRN: [id number] H&P                                                               History and Physical    Suzanne Kim:[id number] DOB: 14-Aug-1940 DOA: 01/22/2018  PCP: [facility]  Patient coming from: Home  I have personally briefly reviewed patient's old medical records in [facility]  Chief Complaint: Weakness  HPI: Suzanne Kim is a 78 y.o. female with medical history significant of multiple sclerosis presents with weakness.  Over the past week patient has had progressive weakness from her baseline.  At baseline she is able to stand and aid with her care.  She has been unable to stand over the past week.  She does have chronic lower extremity weakness worse on the right from her multiple sclerosis.  She has had worsening muscle cramps that she has been taking aspirin /back pain relief medication.  Patient does have a history of upper GI bleeding back in 2018.  She states that the aspirin is the only medication really helps her discomfort.  Patient was found to be anemic with a hemoglobin of 5.  Patient had a EGD done by [facility] that showed friable gastric mucosa.  During that admission she had a hemoglobin of 7.9 and received 1 unit of blood.  Upon discharge her hemoglobin was 10.  Patient was hemodynamically stable here.  She was heme positive.  She denies any abdominal discomfort nausea vomiting.  She has been suffering with upper respiratory infection of the past week.  She was seen by a home doctor who did some labs and did a chest x-ray. She  Did not hear any abnormal results back.  ED Course: Patient accepted from Dr. Rush Landmark. patient was ordered 2 units of packed red blood cells.  Patient was also found to have a CK of 1914.  She was given IV fluids and given some IV fentanyl for her pain.  Review of Systems: Denies chest pain shortness of abdominal pain melena nausea vomiting  positive for weakness and cough all others reviewed with patient  and are  negative unless otherwise stated       Past Medical History:  Diagnosis Date  . Hiatal hernia   . Multiple sclerosis (HCC) 1988         Past Surgical History:  Procedure Laterality Date  . BREAST ENHANCEMENT SURGERY  1975  . ESOPHAGOGASTRODUODENOSCOPY (EGD) WITH PROPOFOL N/A 05/12/2016   Procedure: ESOPHAGOGASTRODUODENOSCOPY (EGD) WITH PROPOFOL;  Surgeon: Bernette Redbird, MD;  Location: [facility] ENDOSCOPY;  Service: Endoscopy;  Laterality: N/A;  . TONSILLECTOMY    . TOTAL VAGINAL HYSTERECTOMY    . WISDOM TOOTH EXTRACTION       reports that she has never smoked. She has never used smokeless tobacco. She reports that she does not drink alcohol or use drugs. She lives at home she does have home care aide and her next-door neighbor helps take care of her she does not have any children No Known Allergies       Family History  Problem Relation Age of Onset  . Heart failure Father   . Ataxia Neg Hx   . Chorea Neg  Hx   . Dementia Neg Hx   . Mental retardation Neg Hx   . Migraines Neg Hx   . Multiple sclerosis Neg Hx   . Neurofibromatosis Neg Hx   . Neuropathy Neg Hx   . Parkinsonism Neg Hx   . Seizures Neg Hx   . Stroke Neg Hx            Prior to Admission medications   Medication Sig Start Date End Date Taking? Authorizing Provider  Acetaminophen (CHLORASEPTIC SORE THROAT PO) Take 1 lozenge by mouth daily as needed (cold symptoms).   Yes [provider]  acetaminophen (TYLENOL) 500 MG tablet Take 500 mg by mouth daily as needed for mild pain, moderate pain, fever or headache.    Yes [provider]  benzonatate (TESSALON) 100 MG capsule Take 100 mg by mouth 2 (two) times daily as needed for cough.   Yes [provider]  Chlorpheniramine-Acetaminophen (CORICIDIN HBP COLD/FLU PO) Take 2 tablets by mouth daily as needed (cold symptoms).   Yes  [provider]  gabapentin (NEURONTIN) 100 MG capsule Take 200 mg by mouth 2 (two) times daily.    Yes [provider]  guaiFENesin (MUCINEX) 600 MG 12 hr tablet Take 600 mg by mouth daily as needed for cough or to loosen phlegm.   Yes [provider]  meclizine (ANTIVERT) 25 MG tablet Take 25 mg by mouth 2 (two) times daily as needed for dizziness.   Yes [provider]  methocarbamol (ROBAXIN) 750 MG tablet Take 900 mg by mouth 2 (two) times daily.   Yes [provider]  omeprazole (PRILOSEC) 20 MG capsule TAKE 1 CAPSULE DAILY 08/01/16  Yes Everlena CooperJaffe, Adam R, DO  raloxifene (EVISTA) 60 MG tablet Take 60 mg by mouth daily.    Yes [provider]  vitamin E 400 UNIT capsule Take 400 Units by mouth daily.   Yes [provider]    Physical Exam:       Vitals:   01/22/18 1930 01/22/18 2000 01/22/18 2011 01/22/18 2032  BP: 128/68 (!) 116/58 (!) 116/58 (!) 120/57  Pulse: 96 89 90 89  Resp: 20 16 16 17   Temp:   98.2 F (36.8 C) 98.4 F (36.9 C)  TempSrc:   Oral Oral  SpO2: 95% 98%    Weight:      Height:        Constitutional: NAD, calm, comfortable       Vitals:   01/22/18 1930 01/22/18 2000 01/22/18 2011 01/22/18 2032  BP: 128/68 (!) 116/58 (!) 116/58 (!) 120/57  Pulse: 96 89 90 89  Resp: 20 16 16 17   Temp:   98.2 F (36.8 C) 98.4 F (36.9 C)  TempSrc:   Oral Oral  SpO2: 95% 98%    Weight:      Height:       Eyes: PERRL, lids and conjunctivae normal ENMT: Mucous membranes are moist. Posterior pharynx clear of any exudate or lesions.Normal dentition.  Neck: normal, supple,  Respiratory: clear to auscultation bilaterally, no wheezing, no crackles. Normal respiratory effort. No accessory muscle use.  Cardiovascular: Regular rate and rhythm, no murmurs / rubs / gallops. No extremity edema. 2+ pedal pulses. Abdomen: no tenderness, no masses palpated.  Bowel sounds positive.    Musculoskeletal: no clubbing / cyanosis. No joint deformity upper and lower extremities.  Fair muscle tone skin: no rashes, lesions, ulcers. No induration Neurologic: CN 2-12 grossly intact.  Right lower extremity 3 out  of 5 strength left lower extremity 3+ out of 5 strength upper extremities 4 out of 5 strength bilaterally  psychiatric: Normal judgment and insight. Alert and oriented x 3. Normal mood.     Labs on Admission: I have personally reviewed following labs and imaging studies  CBC: Recent Labs  Lab 01/22/18 1630  WBC 6.4  NEUTROABS 4.2  HGB 5.4*  HCT 18.9*  MCV 84.0  PLT 149*   Basic Metabolic Panel:    Recent Labs  Lab 01/22/18 1630  NA 142  K 3.6  CL 116*  CO2 21*  GLUCOSE 102*  BUN 22  CREATININE 0.88  CALCIUM 8.1*  MG 2.0  PHOS 3.3   GFR: Estimated Creatinine Clearance: 42.8 mL/min (by C-G formula based on SCr of 0.88 mg/dL). Liver Function Tests:    Recent Labs  Lab 01/22/18 1630  AST 105*  ALT 52*  ALKPHOS 57  BILITOT 0.6  PROT 5.4*  ALBUMIN 2.9*   No results for input(s): LIPASE, AMYLASE in the last 168 hours. No results for input(s): AMMONIA in the last 168 hours. Coagulation Profile: No results for input(s): INR, PROTIME in the last 168 hours. Cardiac Enzymes:    Recent Labs  Lab 01/22/18 1630  CKTOTAL 1,914*   BNP (last 3 results) No results for input(s): PROBNP in the last 8760 hours. HbA1C: No results for input(s): HGBA1C in the last 72 hours. CBG: No results for input(s): GLUCAP in the last 168 hours. Lipid Profile: No results for input(s): CHOL, HDL, LDLCALC, TRIG, CHOLHDL, LDLDIRECT in the last 72 hours. Thyroid Function Tests:    Recent Labs    01/22/18 1630  TSH 2.041   Anemia Panel: No results for input(s): VITAMINB12, FOLATE, FERRITIN, TIBC, IRON, RETICCTPCT in the last 72 hours. Urine analysis:         Component Value Date/Time   COLORURINE YELLOW 09/15/2016 1140   APPEARANCEUR CLEAR  09/15/2016 1140   LABSPEC 1.023 09/15/2016 1140   PHURINE 5.0 09/15/2016 1140   GLUCOSEU NEGATIVE 09/15/2016 1140   HGBUR MODERATE (A) 09/15/2016 1140   BILIRUBINUR NEGATIVE 09/15/2016 1140   KETONESUR 20 (A) 09/15/2016 1140   PROTEINUR 30 (A) 09/15/2016 1140   NITRITE NEGATIVE 09/15/2016 1140   LEUKOCYTESUR NEGATIVE 09/15/2016 1140    Radiological Exams on Admission: Dg Chest 2 View  Result Date: 01/22/2018 CLINICAL DATA:  Shortness of breath with cough EXAM: CHEST - 2 VIEW COMPARISON:  None. FINDINGS: Scattered calcifications are noted bilaterally, likely residua of prior granulomatous disease. No evident edema or consolidation. Heart size and pulmonary vascularity are normal. No adenopathy. There is aortic atherosclerosis. Bones appear osteoporotic. IMPRESSION: Apparent granulomatous disease with multiple foci of calcification. No frank edema or consolidation. Heart size within normal limits. There is aortic atherosclerosis. Bones osteoporotic. Aortic Atherosclerosis (ICD10-I70.0). Electronically Signed   By: Bretta Bang III M.D.   On: 01/22/2018 16:04    EKG: Independently reviewed.  Sinus rhythm right bundle branch block  Assessment/Plan Principal Problem:   Acute posthemorrhagic anemia GI bleed Active Problems:   Anemia   Rhabdomyolysis associated mild transaminitis   Thrombocytopenia (HCC)   Osteoporosis Multiple sclerosis  1.  Admit to telemetry bed n.p.o., IV Protonix, GI consult, serial H/H.  D/C NSAIDs 2.  2 units packed white blood cells ordered in the ED.  Follow  H/H 3.  No obvious reason for patient's elevated CK other than her worsening ambulatory  status over the past week.  Continue IV fluids repeat CK and LFTs  inn a.m. 4.  Patient has HX chronic thrombocytopenia  Though her platelets were normal in August of this year.  Follow 5.  Restart home medications for chronic medical issues when safe to do so.  Currently not on any meds for her  multiple sclerosis.    DVT prophylaxis:S CDs Code Status: DNR  Disposition Plan: STR vs home 2-3 days   Consults called: GI  Dr Bosie ClosSchooler  Admission status: IP tele, it is my clinical opinion that admission to INPATIENT is reasonable and necessary because of the expectation that this patient will require hospital care that crosses at least 2 midnights to treat this condition based on the medical complexity of the problems presented.  Given the aforementioned information, the predictability of an adverse outcome is felt to be significant    Synetta FailEndia Johnson-Pitts MD [facility] Pager [phone]- [phone]  If 7PM-7AM, please contact night-coverage [email or web address] Password [email or web address]  01/22/2018, 8:59 PM           Electronically signed by Synetta FailJohnson-Pitts, Shadiyah Wernli, MD at 01/22/2018 9:15 PM     ED to Hosp-Admission (Current) on 01/22/2018       Detailed Report

## 2018-01-23 NOTE — Consult Note (Addendum)
[facility] Attending   I have taken an interval history, reviewed the chart and examined the patient. I agree with the Advanced Practitioner's note, impression and recommendations.   Plan for EGD today to assess for possible upper GI bleed The risks and benefits as well as alternatives of endoscopic procedure(s) have been discussed and reviewed. All questions answered. The patient agrees to proceed.  Iva Booparl E. Gessner, MD, [facility] 01/23/2018 1:11 PM Pager [phone]     Consultation  Referring Provider: Dr. Mahala MenghiniSamtani     Primary Care Physician:  Almetta [facility] Primary Gastroenterologist: Gentry FitzUnassigned        Reason for Consultation: Anemia          HPI:   Suzanne Kim is a [age] y.o. female with a past medical history significant for multiple sclerosis who presented to the ER on 01/22/2018 with weakness.  We were consulted in regards to a feeling of a hemoglobin of 5.    This morning, the patient is found laying in her bed on her side with her knees scrunched up in discomfort.  Explains that her neighbor knows more about her situation than she does and she calls her on the phone.  Neighbor explains that about a week and a half ago patient experienced increased lower back pain which radiated down to her legs and thought she was having an "MS flare".  Started taking aspirin for back pain relief 325 mg at least 2 times a day per her note, though "she is not good at keeping track of her meds".  Tells me that prior to this past week patient was able to ambulate with a walker, but has been doing very poorly now.  Patient was not complaining of any abdominal pain, she is unaware of any melena at home.  No nausea or vomiting.  Currently patient does complain of some nausea but thinks this is just from not eating.    History of upper GI bleed in 2018.  Also reports upper respiratory infection over the past week, per ED physician.    Denies fever, chills, weight loss,  heartburn, reflux or vomiting.  ED course: 2 units PRBCs ordered, hemoglobin 5.4, hematocrit 18.9, platelets 149, white count normal, creatinine normal, AST elevated at 105, ALT 52, albumin 2.9, CK total 1914, chest x-ray showing apparent granulomatous disease with multiple foci of calcification, no frank edema or consolidation, heart size within normal limits, aortic atherosclerosis osteoporotic bones  Past GI history: EGD by [facility] during admission: Friable gastric mucosa  Past Medical History:  Diagnosis Date  . Hiatal hernia   . Multiple sclerosis (HCC) 1988    Past Surgical History:  Procedure Laterality Date  . BREAST ENHANCEMENT SURGERY  1975  . ESOPHAGOGASTRODUODENOSCOPY (EGD) WITH PROPOFOL N/A 05/12/2016   Procedure: ESOPHAGOGASTRODUODENOSCOPY (EGD) WITH PROPOFOL;  Surgeon: Bernette Redbirdobert Buccini, MD;  Location: [facility];  Service: Endoscopy;  Laterality: N/A;  . TONSILLECTOMY    . TOTAL VAGINAL HYSTERECTOMY    . WISDOM TOOTH EXTRACTION      Family History  Problem Relation Age of Onset  . Heart failure Father   . Ataxia Neg Hx   . Chorea Neg Hx   . Dementia Neg Hx   . Mental retardation Neg Hx   . Migraines Neg Hx   . Multiple sclerosis Neg Hx   . Neurofibromatosis Neg Hx   . Neuropathy Neg Hx   . Parkinsonism Neg Hx   . Seizures Neg Hx   .  Stroke Neg Hx    Social History   Tobacco Use  . Smoking status: Never Smoker  . Smokeless tobacco: Never Used  Substance Use Topics  . Alcohol use: No    Alcohol/week: 0.0 standard drinks  . Drug use: No    Prior to Admission medications   Medication Sig Start Date End Date Taking? Authorizing Provider  Acetaminophen (CHLORASEPTIC SORE THROAT PO) Take 1 lozenge by mouth daily as needed (cold symptoms).   Yes [provider]  acetaminophen (TYLENOL) 500 MG tablet Take 500 mg by mouth daily as needed for mild pain, moderate pain, fever or headache.    Yes [provider]  benzonatate (TESSALON) 100 MG  capsule Take 100 mg by mouth 2 (two) times daily as needed for cough.   Yes [provider]  Chlorpheniramine-Acetaminophen (CORICIDIN HBP COLD/FLU PO) Take 2 tablets by mouth daily as needed (cold symptoms).   Yes [provider]  gabapentin (NEURONTIN) 100 MG capsule Take 200 mg by mouth 2 (two) times daily.    Yes [provider]  guaiFENesin (MUCINEX) 600 MG 12 hr tablet Take 600 mg by mouth daily as needed for cough or to loosen phlegm.   Yes [provider]  meclizine (ANTIVERT) 25 MG tablet Take 25 mg by mouth 2 (two) times daily as needed for dizziness.   Yes [provider]  methocarbamol (ROBAXIN) 750 MG tablet Take 900 mg by mouth 2 (two) times daily.   Yes [provider]  omeprazole (PRILOSEC) 20 MG capsule TAKE 1 CAPSULE DAILY 08/01/16  Yes Everlena Cooper, Adam R, DO  raloxifene (EVISTA) 60 MG tablet Take 60 mg by mouth daily.    Yes [provider]  vitamin E 400 UNIT capsule Take 400 Units by mouth daily.   Yes [provider]    Current Facility-Administered Medications  Medication Dose Route Frequency Provider Last Rate Last Dose  . acetaminophen (TYLENOL) tablet 650 mg  650 mg Oral Q6H PRN Johnson-Pitts, Endia, MD       Or  . acetaminophen (TYLENOL) suppository 650 mg  650 mg Rectal Q6H PRN Johnson-Pitts, Endia, MD      . bisacodyl (DULCOLAX) EC tablet 5 mg  5 mg Oral Daily PRN Johnson-Pitts, Endia, MD      . dextrose 5 % and 0.9 % NaCl with KCl 20 mEq/L infusion   Intravenous Continuous Johnson-Pitts, Endia, MD 75 mL/hr at 01/23/18 0200    . HYDROcodone-acetaminophen (NORCO/VICODIN) 5-325 MG per tablet 1 tablet  1 tablet Oral Q6H PRN Johnson-Pitts, Endia, MD      . HYDROmorphone (DILAUDID) injection 0.5 mg  0.5 mg Intravenous Q4H PRN Johnson-Pitts, Endia, MD   0.5 mg at 01/23/18 0809  . ondansetron (ZOFRAN) tablet 4 mg  4 mg Oral Q6H PRN Johnson-Pitts, Endia, MD       Or  . ondansetron (ZOFRAN) injection 4 mg  4  mg Intravenous Q6H PRN Johnson-Pitts, Endia, MD      . pantoprazole (PROTONIX) 80 mg in sodium chloride 0.9 % 250 mL (0.32 mg/mL) infusion  8 mg/hr Intravenous Continuous Johnson-Pitts, Endia, MD 25 mL/hr at 01/23/18 0332 8 mg/hr at 01/23/18 0332  . [START ON 01/26/2018] pantoprazole (PROTONIX) injection 40 mg  40 mg Intravenous Q12H Johnson-Pitts, Endia, MD      . senna-docusate (Senokot-S) tablet 1 tablet  1 tablet Oral QHS PRN Johnson-Pitts, Endia, MD        Allergies as of 01/22/2018  . (No Known Allergies)  Review of Systems:    Constitutional: No weight loss, fever or chills Skin: No rash Cardiovascular: No chest pain Respiratory: No SOB  Gastrointestinal: See HPI and otherwise negative Genitourinary: No dysuria  Neurological: No headache Musculoskeletal: No new muscle or joint pain Hematologic: No bleeding  Psychiatric: No history of depression or anxiety    Physical Exam:  Vital signs in last 24 hours: Temp:  [97.4 F (36.3 C)-98.9 F (37.2 C)] 98.2 F (36.8 C) (01/07 0437) Pulse Rate:  [81-96] 81 (01/07 0437) Resp:  [13-20] 16 (01/07 0437) BP: (116-140)/(35-80) 140/63 (01/07 0437) SpO2:  [91 %-98 %] 91 % (01/07 0437) Weight:  [49.9 kg-58.3 kg] 58.3 kg (01/06 2111) Last BM Date: 01/21/18 General:   Pleasant Elderly Caucasian female appears to be in mild distress, Well developed, Well nourished, alert and cooperative Head:  Normocephalic and atraumatic. Eyes:   PEERL, EOMI. No icterus. Conjunctiva pink. Ears:  Normal auditory acuity. Neck:  Supple Throat: Oral cavity and pharynx without inflammation, swelling or lesion.  Lungs: Respirations even and unlabored. Inspiratory wheezing anteriorly Heart: Normal S1, S2. No MRG. Regular rate and rhythm. No peripheral edema, cyanosis or pallor.  Abdomen:  Very limited due to contracture Soft, nondistended.+BS Rectal:  Not performed.  Msk:  Symmetrical without gross deformities. Peripheral pulses intact.  Extremities:  B/l  Contractures of LE Neurologic:  Alert and  oriented x4 Skin:   Dry and intact without significant lesions or rashes. Psychiatric: Demonstrates good judgement and reason without abnormal affect or behaviors.   LAB RESULTS: Recent Labs    01/22/18 1630 01/23/18 0429  WBC 6.4  --   HGB 5.4* 9.0*  HCT 18.9* 29.3*  PLT 149*  --    BMET Recent Labs    01/22/18 1630 01/23/18 0429  NA 142 144  K 3.6 3.7  CL 116* 120*  CO2 21* 18*  GLUCOSE 102* 124*  BUN 22 13  CREATININE 0.88 0.79  CALCIUM 8.1* 7.5*   LFT Recent Labs    01/23/18 0429  PROT 5.0*  ALBUMIN 2.7*  AST 87*  ALT 48*  ALKPHOS 61  BILITOT 0.6   STUDIES: Dg Chest 2 View  Result Date: 01/22/2018 CLINICAL DATA:  Shortness of breath with cough EXAM: CHEST - 2 VIEW COMPARISON:  None. FINDINGS: Scattered calcifications are noted bilaterally, likely residua of prior granulomatous disease. No evident edema or consolidation. Heart size and pulmonary vascularity are normal. No adenopathy. There is aortic atherosclerosis. Bones appear osteoporotic. IMPRESSION: Apparent granulomatous disease with multiple foci of calcification. No frank edema or consolidation. Heart size within normal limits. There is aortic atherosclerosis. Bones osteoporotic. Aortic Atherosclerosis (ICD10-I70.0). Electronically Signed   By: Bretta Bang III M.D.   On: 01/22/2018 16:04    Impression / Plan:   Impression: 1.  Anemia: hgb 5.4--> 2 u PRBCs-->9.0, report of aspirin 325 at least twice daily over the past week and a half per patient and her neighbor who does assist with her care, denies abdominal pain, does complain of some nausea this morning, EGD in 2018 with friable gastric mucosa thought related to NSAID use; consider upper GI bleed versus other 2.  Mild transaminitis: Thought related to rhabdomyolysis with elevated CK 3.  Thrombocytopenia: ? Etiology? 4.  Multiple sclerosis: currently increase in contractures over the past  week  Plan: 1. Consider abdominal imaging during admission given thrombocytopenia and anemia  2.  Plan for EGD today with Dr. Leone Payor.  Did discuss risks,  benefits, limitations and alternatives and  patient agrees to proceed. 3.  Patient remain n.p.o. until after time procedure. 4.  Patient is currently on Pantoprazole infusion, possibly change to BID PPI pending results of EGD. 5.  Continue to monitor hemoglobin and transfusion as needed less than 7 6.  Please await further recommendations from Dr. Leone PayorGessner after time of procedure  Thank you for your kind consultation, we will continue to follow.  Violet BaldyJennifer Lynne [facility]  01/23/2018, 10:43 AM

## 2018-01-23 NOTE — Progress Notes (Signed)
Pt complaining of increased SOB after returning from EGD.  Oxygen sats were 88-90% on RA and respirations slightly labored at 28.  Crackles noted upon ausculation in upper lobes.  Oxygen applied, MD notified.  Orders received, will continue to monitor.

## 2018-01-23 NOTE — Progress Notes (Signed)
Agree with plan of care and work-up performed  Saw the patient in endoscopy suite patient is mildly febrile with a low-grade temperature in the 100 range She was found to have [facility] ulcer and large hiatal hernia on EGD 1/7 and may need a colonoscopy I would defer planning to GI on that-Allow full liquid diet until otherwise specified per GI as patient might have colonoscopy  Would panculture with chest x-ray urinary culture and blood cultures x2 if she spikes fever over 100.5-recheck CK in a.m. Continue IV fluids 100 cc/h--suspect if she has a fever and the most likely source would be UTI  Pleas Koch, MD [facility] 2:21 PM

## 2018-01-23 NOTE — Anesthesia Postprocedure Evaluation (Signed)
Anesthesia Post Note  Patient: VY BADLEY  Procedure(s) Performed: ESOPHAGOGASTRODUODENOSCOPY (EGD) WITH PROPOFOL (N/A )     Patient location during evaluation: Endoscopy Anesthesia Type: MAC Level of consciousness: awake and alert, oriented and patient cooperative Pain management: pain level controlled Respiratory status: spontaneous breathing, nonlabored ventilation, respiratory function stable and patient connected to nasal cannula oxygen Cardiovascular status: blood pressure returned to baseline and stable Postop Assessment: no apparent nausea or vomiting Anesthetic complications: no    Last Vitals:  Vitals:   01/23/18 1350 01/23/18 1424  BP: (!) 152/60 (!) 90/50  Pulse: 94 92  Resp: 18 16  Temp:  36.8 C  SpO2: 95% 93%    Last Pain:  Vitals:   01/23/18 1646  TempSrc:   PainSc: 3                  Sabah Zucco,E. Manmeet Arzola

## 2018-01-23 NOTE — Anesthesia Preprocedure Evaluation (Addendum)
Anesthesia Evaluation  Patient identified by MRN, date of birth, ID band Patient awake    Reviewed: Allergy & Precautions, NPO status , Patient's Chart, lab work & pertinent test results  History of Anesthesia Complications Negative for: history of anesthetic complications  Airway Mallampati: III  TM Distance: >3 FB Neck ROM: Full    Dental  (+) Dental Advisory Given, Chipped   Pulmonary neg pulmonary ROS, Recent URI , Resolved,    breath sounds clear to auscultation       Cardiovascular (-) anginanegative cardio ROS   Rhythm:Regular Rate:Normal     Neuro/Psych MS: uses walker    GI/Hepatic hiatal hernia, GERD  Medicated and Controlled,Elevated LFTs   Endo/Other  negative endocrine ROS  Renal/GU negative Renal ROS     Musculoskeletal   Abdominal   Peds  Hematology  (+) anemia , Hb 9.0 (s/p transfusion), plt 149   Anesthesia Other Findings   Reproductive/Obstetrics                            Anesthesia Physical Anesthesia Plan  ASA: III  Anesthesia Plan: MAC   Post-op Pain Management:    Induction:   PONV Risk Score and Plan: 2  Airway Management Planned: Natural Airway and Nasal Cannula  Additional Equipment:   Intra-op Plan:   Post-operative Plan:   Informed Consent: I have reviewed the patients History and Physical, chart, labs and discussed the procedure including the risks, benefits and alternatives for the proposed anesthesia with the patient or authorized representative who has indicated his/her understanding and acceptance.   Dental advisory given  Plan Discussed with: CRNA and Surgeon  Anesthesia Plan Comments: (Plan routine monitors, MAC)        Anesthesia Quick Evaluation

## 2018-01-24 DIAGNOSIS — K257 Chronic gastric ulcer without hemorrhage or perforation: Secondary | ICD-10-CM

## 2018-01-24 LAB — RENAL FUNCTION PANEL
Albumin: 2.5 g/dL — ABNORMAL LOW (ref 3.5–5.0)
Anion gap: 6 (ref 5–15)
BUN: 10 mg/dL (ref 8–23)
CHLORIDE: 115 mmol/L — AB (ref 98–111)
CO2: 22 mmol/L (ref 22–32)
Calcium: 7.7 mg/dL — ABNORMAL LOW (ref 8.9–10.3)
Creatinine, Ser: 0.96 mg/dL (ref 0.44–1.00)
GFR calc Af Amer: >60 mL/min (ref >60)
GFR calc non Af Amer: 57 mL/min — ABNORMAL LOW (ref >60)
Glucose, Bld: 103 mg/dL — ABNORMAL HIGH (ref 70–99)
Phosphorus: 3.6 mg/dL (ref 2.5–4.6)
Potassium: 3.6 mmol/L (ref 3.5–5.1)
Sodium: 143 mmol/L (ref 135–145)

## 2018-01-24 LAB — CBC WITH DIFFERENTIAL/PLATELET
Abs Immature Granulocytes: 0.13 10*3/uL — ABNORMAL HIGH (ref 0.00–0.07)
Basophils Absolute: 0 10*3/uL (ref 0.0–0.1)
Basophils Relative: 0 %
EOS PCT: 2 %
Eosinophils Absolute: 0.2 10*3/uL (ref 0.0–0.5)
HCT: 29.3 % — ABNORMAL LOW (ref 36.0–46.0)
HEMOGLOBIN: 8.9 g/dL — AB (ref 12.0–15.0)
Immature Granulocytes: 1 %
Lymphocytes Relative: 14 %
Lymphs Abs: 1.5 10*3/uL (ref 0.7–4.0)
MCH: 27.2 pg (ref 26.0–34.0)
MCHC: 30.4 g/dL (ref 30.0–36.0)
MCV: 89.6 fL (ref 80.0–100.0)
Monocytes Absolute: 1.3 10*3/uL — ABNORMAL HIGH (ref 0.1–1.0)
Monocytes Relative: 12 %
Neutro Abs: 7.7 10*3/uL (ref 1.7–7.7)
Neutrophils Relative %: 71 %
Platelets: 122 10*3/uL — ABNORMAL LOW (ref 150–400)
RBC: 3.27 MIL/uL — ABNORMAL LOW (ref 3.87–5.11)
RDW: 16.6 % — ABNORMAL HIGH (ref 11.5–15.5)
WBC: 10.9 10*3/uL — ABNORMAL HIGH (ref 4.0–10.5)
nRBC: 0.5 % — ABNORMAL HIGH (ref 0.0–0.2)

## 2018-01-24 LAB — CK: Total CK: 539 U/L — ABNORMAL HIGH (ref 38–234)

## 2018-01-24 MED ORDER — SODIUM CHLORIDE 0.9 % IV SOLN
510.0000 mg | Freq: Once | INTRAVENOUS | Status: AC
  Administered 2018-01-24: 510 mg via INTRAVENOUS
  Filled 2018-01-24: qty 17

## 2018-01-24 NOTE — Progress Notes (Signed)
Patient Demographics:    Suzanne Kim, is a 78 y.o. female, DOB - 04/03/1940, ELT:[id number]  Admit date - 01/22/2018   Admitting Physician Synetta Fail, MD  Outpatient Primary MD for the patient is [facility]  LOS - 2   Chief Complaint  Patient presents with  . Spasms        Subjective:    Suzanne Kim today has no fevers, no emesis,  No chest pain, appetite is not great  Assessment  & Plan :    Principal Problem:   Acute posthemorrhagic anemia Active Problems:   Secondary progressive multiple sclerosis (HCC)   GIB (gastrointestinal bleeding)   Symptomatic anemia   Rhabdomyolysis   Thrombocytopenia (HCC)   Osteoporosis   Heme + stool  Brief summary 78 y.o.femalewith medical history significant ofmultiple sclerosis presents with weakness, admitted on 01/23/2018, found to have anemia with Hgb of 5.4  Plan:-  1) acute GI bleed-- [facility] ulcer and large hiatal hernia on EGD 1/7 /20, patient awaiting possible colonoscopy, GI consult appreciated check serial H&H and transfuse as needed  2)Hypoxic respiratory failure----continue oxygen supplementation  3) history of chronic thrombocytopenia------- follow CBC and transfuse platelets per clinical criteria  4) anemia secondary to acute blood loss--- status post transfusion of 2 units of packed cells, monitor H&H  Disposition/Need for in-Hospital Stay- patient unable to be discharged at this time due to awaiting insurance authorization for transfer to SNF  Code Status : full   Family Communication:   Paid caregiver at bedside   Disposition Plan  : SNF rehab  Consults  :  Gi  DVT Prophylaxis  :  - SCDs   Lab Results  Component Value Date   PLT 122 (L) 01/24/2018    Inpatient Medications  Scheduled Meds: . gabapentin  200 mg Oral BID  . methocarbamol  750 mg Oral TID  . pantoprazole  40 mg Oral QAC  breakfast   Continuous Infusions: . ferumoxytol     PRN Meds:.acetaminophen **OR** acetaminophen, bisacodyl, guaiFENesin, HYDROcodone-acetaminophen, HYDROmorphone (DILAUDID) injection, ondansetron **OR** ondansetron (ZOFRAN) IV, senna-docusate    Anti-infectives (From admission, onward)   None        Objective:   Vitals:   01/23/18 2241 01/24/18 0507 01/24/18 1539 01/24/18 1853  BP: (!) 100/52 (!) 119/44 (!) 97/47   Pulse: 74 84 75   Resp: 16 18 18    Temp: 98.2 F (36.8 C) 98.1 F (36.7 C) 98.9 F (37.2 C)   TempSrc: Oral Oral Oral   SpO2: 97% 96% 99% (!) 83%  Weight:      Height:        Wt Readings from Last 3 Encounters:  01/22/18 58.3 kg  04/12/17 52.2 kg  05/11/16 52.2 kg     Intake/Output Summary (Last 24 hours) at 01/24/2018 1924 Last data filed at 01/24/2018 1754 Gross per 24 hour  Intake 240 ml  Output -  Net 240 ml     Physical Exam Patient is examined daily including today on 01/24/18 , exams remain the same as of yesterday except that has changed   Gen:- Awake Alert, frail-appearing HEENT:- [place].AT, No sclera icterus Nose- [place] 2 L/min Neck-Supple Neck,No JVD,.  Lungs-  CTAB , fair symmetrical air movement CV- S1,  S2 normal, regular  Abd-  +ve B.Sounds, Abd Soft, No tenderness,    Extremity/Skin:-   pedal pulses present  Psych-affect is appropriate, oriented x3 Neuro-no new focal deficits, no tremors   Data Review:   Micro Results No results found for this or any previous visit (from the past 240 hour(s)).  Radiology Reports Dg Chest 2 View  Result Date: 01/22/2018 CLINICAL DATA:  Shortness of breath with cough EXAM: CHEST - 2 VIEW COMPARISON:  None. FINDINGS: Scattered calcifications are noted bilaterally, likely residua of prior granulomatous disease. No evident edema or consolidation. Heart size and pulmonary vascularity are normal. No adenopathy. There is aortic atherosclerosis. Bones appear osteoporotic. IMPRESSION: Apparent granulomatous  disease with multiple foci of calcification. No frank edema or consolidation. Heart size within normal limits. There is aortic atherosclerosis. Bones osteoporotic. Aortic Atherosclerosis (ICD10-I70.0). Electronically Signed   By: Bretta Bang III M.D.   On: 01/22/2018 16:04   Dg Chest Port 1 View  Result Date: 01/23/2018 CLINICAL DATA:  Pleural effusion, follow-up, history multiple sclerosis EXAM: PORTABLE CHEST 1 VIEW COMPARISON:  Portable exam 1648 hours compared to 01/22/2018 FINDINGS: Rotated to the LEFT. Enlargement of cardiac silhouette. Atherosclerotic calcification aorta. New opacities in the RIGHT upper lobe and LEFT lower lobe question pneumonia. Calcific density in the mid RIGHT lung unchanged. Probable small LEFT pleural effusion. No pneumothorax. Bones demineralized. IMPRESSION: Enlargement of cardiac silhouette. Probable infiltrates in RIGHT upper and LEFT lower lobes with associated LEFT pleural effusion. Electronically Signed   By: Ulyses Southward M.D.   On: 01/23/2018 17:27     CBC Recent Labs  Lab 01/22/18 1630 01/23/18 0429 01/24/18 0537  WBC 6.4  --  10.9*  HGB 5.4* 9.0* 8.9*  HCT 18.9* 29.3* 29.3*  PLT 149*  --  122*  MCV 84.0  --  89.6  MCH 24.0*  --  27.2  MCHC 28.6*  --  30.4  RDW 14.8  --  16.6*  LYMPHSABS 1.4  --  1.5  MONOABS 0.6  --  1.3*  EOSABS 0.1  --  0.2  BASOSABS 0.0  --  0.0    Chemistries  Recent Labs  Lab 01/22/18 1630 01/23/18 0429 01/24/18 0537  NA 142 144 143  K 3.6 3.7 3.6  CL 116* 120* 115*  CO2 21* 18* 22  GLUCOSE 102* 124* 103*  BUN 22 13 10   CREATININE 0.88 0.79 0.96  CALCIUM 8.1* 7.5* 7.7*  MG 2.0  --   --   AST 105* 87*  --   ALT 52* 48*  --   ALKPHOS 57 61  --   BILITOT 0.6 0.6  --    ------------------------------------------------------------------------------------------------------------------ No results for input(s): CHOL, HDL, LDLCALC, TRIG, CHOLHDL, LDLDIRECT in the last 72 hours.  No results found for:  HGBA1C ------------------------------------------------------------------------------------------------------------------ Recent Labs    01/22/18 1630  TSH 2.041   ------------------------------------------------------------------------------------------------------------------ No results for input(s): VITAMINB12, FOLATE, FERRITIN, TIBC, IRON, RETICCTPCT in the last 72 hours.  Coagulation profile No results for input(s): INR, PROTIME in the last 168 hours.  No results for input(s): DDIMER in the last 72 hours.  Cardiac Enzymes No results for input(s): CKMB, TROPONINI, MYOGLOBIN in the last 168 hours.  Invalid input(s): CK ------------------------------------------------------------------------------------------------------------------    Component Value Date/Time   BNP 461.5 (H) 01/22/2018 1630     Reggie Bise M.D on 01/24/2018 at 7:24 PM  Pager---[phone] Go to [email or web address] - password [email or web address] for contact info  [facility] - Office  [phone]

## 2018-01-24 NOTE — Care Management Note (Signed)
Case Management Note  Patient Details  Name: Suzanne Kim MRN: [id number] Date of Birth: 1940-07-09  Subjective/Objective: Acute post hermorrhagic anemia. From home w/caregivers. GI following. PT cons-recc SNF. Patient in agreement to SNF has gone to [facility] in the past-CSW aware.                   Action/Plan:dc SNF.   Expected Discharge Date:                  Expected Discharge Plan:  Skilled Nursing Facility  In-House Referral:  Clinical Social Work  Discharge planning Services  CM Consult  Post Acute Care Choice:    Choice offered to:     DME Arranged:    DME Agency:     HH Arranged:    HH Agency:     Status of Service:  In process, will continue to follow  If discussed at Long Length of Stay Meetings, dates discussed:    Additional Comments:  Lanier Clam, RN 01/24/2018, 2:05 PM

## 2018-01-24 NOTE — NC FL2 (Signed)
[place] MEDICAID FL2 LEVEL OF CARE SCREENING TOOL     IDENTIFICATION  Patient Name: Suzanne Kim Birthdate: May 14, 1940 Sex: female Admission Date (Current Location): 01/22/2018  [facility] and [place] Number:  Producer, television/film/video and Address:  [facility],  [address], [place] [address]      Provider Number: [id number]  Attending Physician Name and Address:  Shon Hale, MD  Relative Name and Phone Number:  Eloise Harman, [phone]    Current Level of Care: Hospital Recommended Level of Care: Skilled Nursing Facility Prior Approval Number:    Date Approved/Denied:   PASRR Number: [id number]  Discharge Plan: SNF    Current Diagnoses: Patient Active Problem List   Diagnosis Date Noted  . Heme + stool   . Acute posthemorrhagic anemia 01/22/2018  . Symptomatic anemia 01/22/2018  . Rhabdomyolysis 01/22/2018  . Thrombocytopenia (HCC) 01/22/2018  . Osteoporosis 01/22/2018  . GIB (gastrointestinal bleeding) 05/11/2016  . Muscle spasm of right lower extremity 10/14/2013  . Overactive bladder 10/14/2013  . Hip pain, bilateral 10/14/2013  . Bilateral low back pain without sciatica 10/14/2013  . Secondary progressive multiple sclerosis (HCC) 04/09/2013    Orientation RESPIRATION BLADDER Height & Weight     Self, Time, Situation, Place  O2(2L) Incontinent Weight: 128 lb 8.5 oz (58.3 kg) Height:  5\' 4"  (162.6 cm)  BEHAVIORAL SYMPTOMS/MOOD NEUROLOGICAL BOWEL NUTRITION STATUS      Continent Diet(soft diet)  AMBULATORY STATUS COMMUNICATION OF NEEDS Skin   Extensive Assist Verbally                         Personal Care Assistance Level of Assistance  Bathing, Feeding, Dressing Bathing Assistance: Maximum assistance Feeding assistance: Limited assistance Dressing Assistance: Maximum assistance     Functional Limitations Info  Sight, Hearing, Speech Sight Info: Adequate Hearing Info: Adequate Speech Info: Adequate    SPECIAL  CARE FACTORS FREQUENCY  PT (By licensed PT), OT (By licensed OT)     PT Frequency: 5x wk OT Frequency: 5xwk            Contractures Contractures Info: Present    Additional Factors Info  Code Status, Allergies Code Status Info: Full Code Allergies Info: No Known Allergies           Current Medications (01/24/2018):  This is the current hospital active medication list Current Facility-Administered Medications  Medication Dose Route Frequency Provider Last Rate Last Dose  . acetaminophen (TYLENOL) tablet 650 mg  650 mg Oral Q6H PRN Iva Boop, MD       Or  . acetaminophen (TYLENOL) suppository 650 mg  650 mg Rectal Q6H PRN Iva Boop, MD      . bisacodyl (DULCOLAX) EC tablet 5 mg  5 mg Oral Daily PRN Iva Boop, MD      . gabapentin (NEURONTIN) capsule 200 mg  200 mg Oral BID Rhetta Mura, MD   200 mg at 01/24/18 1139  . guaiFENesin (MUCINEX) 12 hr tablet 600 mg  600 mg Oral Daily PRN Rhetta Mura, MD      . HYDROcodone-acetaminophen (NORCO/VICODIN) 5-325 MG per tablet 1 tablet  1 tablet Oral Q6H PRN Iva Boop, MD   1 tablet at 01/23/18 1546  . HYDROmorphone (DILAUDID) injection 0.5 mg  0.5 mg Intravenous Q4H PRN Iva Boop, MD   0.5 mg at 01/24/18 0131  . methocarbamol (ROBAXIN) tablet 750 mg  750 mg Oral TID Rhetta Mura, MD  750 mg at 01/24/18 1139  . ondansetron (ZOFRAN) tablet 4 mg  4 mg Oral Q6H PRN Iva Boop, MD       Or  . ondansetron [facility]) injection 4 mg  4 mg Intravenous Q6H PRN Iva Boop, MD   4 mg at 01/23/18 1316  . pantoprazole (PROTONIX) EC tablet 40 mg  40 mg Oral QAC breakfast Iva Boop, MD   40 mg at 01/24/18 1138  . senna-docusate (Senokot-S) tablet 1 tablet  1 tablet Oral QHS PRN Iva Boop, MD         Discharge Medications: Please see discharge summary for a list of discharge medications.  Relevant Imaging Results:  Relevant Lab Results:   Additional Information SSN:  [id number]  Althea Charon, LCSW

## 2018-01-24 NOTE — Progress Notes (Signed)
O2 Sats dropped to 83% RA while laying in bed. Pt unable to get OOB at this time and cannot sit up very straight d/t contractures and pain. O2 Sats quickly recovered to 94% 2L/[place].

## 2018-01-24 NOTE — Progress Notes (Signed)
Patient wants to discuss code status with attending this morning. Will report to oncoming nurse.

## 2018-01-24 NOTE — Progress Notes (Signed)
   Patient Name: KYNSLI UNDERDOWN Date of Encounter: 01/24/2018, 4:10 PM    Subjective  Stronger but "I'm not strong enough for a colonoscopy, I think it would do me in"   Objective  BP (!) 97/47 (BP Location: Right Arm)   Pulse 75   Temp 98.9 F (37.2 C) (Oral)   Resp 18   Ht 5\' 4"  (1.626 m)   Wt 58.3 kg   SpO2 99%   BMI 22.06 kg/m  Contractured, pale, in bed    Assessment and Plan  Anemia ? Of iron deficiency (suspect) Cameron erosions and large hiatal hernia Frail and weak - multiple sclerosis  Parenteral iron Risk/benefits of colonoscopy do not support performing one Signing off  Iva Boop, MD, [facility] 01/24/2018 4:10 PM Pager [phone]

## 2018-01-24 NOTE — Evaluation (Signed)
Physical Therapy Evaluation Patient Details Name: Suzanne Kim MRN: [id number] DOB: [birth year] Today's Date: 01/24/2018   History of Present Illness  78 yo female admitted with acute posthemorrhagic anemia, weakness, fall. Hx of MS, osteoporosis  Clinical Impression  On eval, pt required Total Assist for bed mobility. She sat EOB for at least 8 minutes with Min guard assist and heavy reliance on bil UEs. Attempted standing with STEDY with +2 assist-pt was unable. She c/o back pain which is limiting her mobility in addition to weakness. Discussed d/c plan-pt is open to ST rehab at [facility]. Recommend CSW consult and SNF rehab.     Follow Up Recommendations SNF    Equipment Recommendations  None recommended by PT    Recommendations for Other Services       Precautions / Restrictions Precautions Precautions: Fall Restrictions Weight Bearing Restrictions: No      Mobility  Bed Mobility Overal bed mobility: Needs Assistance Bed Mobility: Sidelying to Sit   Sidelying to sit: Total assist       General bed mobility comments: Assist for trunk and bil LEs. Utilized bedpad to aid with positioning, scooting.  Transfers Overall transfer level: Needs assistance   Transfers: Sit to/from Stand Sit to Stand: Total assist;+2 physical assistance;+2 safety/equipment         General transfer comment: Attempted sit to stand x 1 with STEDY with +2 assist. Pt unable to clear bottom from bed. She c/o back pain limiting ability to complete task.   Ambulation/Gait                Stairs            Wheelchair Mobility    Modified Rankin (Stroke Patients Only)       Balance Overall balance assessment: History of Falls;Needs assistance           Standing balance-Leahy Scale: Zero                               Pertinent Vitals/Pain Pain Assessment: Faces Faces Pain Scale: Hurts even more Pain Location: back Pain Descriptors / Indicators:  Aching;Discomfort;Sore;Grimacing Pain Intervention(s): Limited activity within patient's tolerance;Repositioned    Home Living Family/patient expects to be discharged to:: Unsure Living Arrangements: Alone Available Help at Discharge: Personal care attendant;Neighbor Type of Home: House       Home Layout: One level Home Equipment: Walker - 2 wheels;Wheelchair - manual;Bedside commode      Prior Function Level of Independence: Needs assistance   Gait / Transfers Assistance Needed: Around Christmas, pt was able to stand and pivot using a RW. In the last few weeks, pt has needed 1-2 person assist for mobility.            Hand Dominance        Extremity/Trunk Assessment   Upper Extremity Assessment Upper Extremity Assessment: Generalized weakness    Lower Extremity Assessment Lower Extremity Assessment: RLE deficits/detail;LLE deficits/detail RLE Deficits / Details: Strength: DF/PF 0/5. Hip/knee ~2/5  LLE Deficits / Details: At least 3/5 throughout    Cervical / Trunk Assessment Cervical / Trunk Assessment: Kyphotic(severely kyphotic neck and trunk)  Communication   Communication: No difficulties  Cognition Arousal/Alertness: Awake/alert Behavior During Therapy: WFL for tasks assessed/performed Overall Cognitive Status: Within Functional Limits for tasks assessed  General Comments      Exercises     Assessment/Plan    PT Assessment Patient needs continued PT services  PT Problem List Decreased strength;Decreased activity tolerance;Decreased balance;Decreased mobility;Pain;Decreased knowledge of use of DME;Decreased coordination       PT Treatment Interventions DME instruction;Gait training;Functional mobility training;Balance training;Patient/family education;Therapeutic activities;Therapeutic exercise    PT Goals (Current goals can be found in the Care Plan section)  Acute Rehab PT Goals Patient  Stated Goal: to return home PT Goal Formulation: With patient Time For Goal Achievement: 02/07/18 Potential to Achieve Goals: Fair    Frequency Min 2X/week   Barriers to discharge Decreased caregiver support      Co-evaluation               AM-PAC PT "6 Clicks" Mobility  Outcome Measure Help needed turning from your back to your side while in a flat bed without using bedrails?: Total Help needed moving from lying on your back to sitting on the side of a flat bed without using bedrails?: Total Help needed moving to and from a bed to a chair (including a wheelchair)?: Total Help needed standing up from a chair using your arms (e.g., wheelchair or bedside chair)?: Total Help needed to walk in hospital room?: Total Help needed climbing 3-5 steps with a railing? : Total 6 Click Score: 6    End of Session   Activity Tolerance: Patient limited by pain Patient left: in bed;with call bell/phone within reach(sitting at EOB with aide and RN-RN to give meds)   PT Visit Diagnosis: Pain;Muscle weakness (generalized) (M62.81)    Time: 4163-8453 PT Time Calculation (min) (ACUTE ONLY): 19 min   Charges:   PT Evaluation $PT Eval Moderate Complexity: 1 Mod            Rebeca Alert, PT Acute Rehabilitation Services Pager: [phone] Office: [phone]

## 2018-01-25 ENCOUNTER — Encounter (HOSPITAL_COMMUNITY): Payer: Self-pay | Admitting: Internal Medicine

## 2018-01-25 LAB — CBC
HCT: 29.3 % — ABNORMAL LOW (ref 36.0–46.0)
Hemoglobin: 8.8 g/dL — ABNORMAL LOW (ref 12.0–15.0)
MCH: 27.4 pg (ref 26.0–34.0)
MCHC: 30 g/dL (ref 30.0–36.0)
MCV: 91.3 fL (ref 80.0–100.0)
Platelets: 98 10*3/uL — ABNORMAL LOW (ref 150–400)
RBC: 3.21 MIL/uL — ABNORMAL LOW (ref 3.87–5.11)
RDW: 16.8 % — AB (ref 11.5–15.5)
WBC: 8.8 10*3/uL (ref 4.0–10.5)
nRBC: 0.3 % — ABNORMAL HIGH (ref 0.0–0.2)

## 2018-01-25 LAB — BASIC METABOLIC PANEL
Anion gap: 8 (ref 5–15)
BUN: 13 mg/dL (ref 8–23)
CALCIUM: 7.9 mg/dL — AB (ref 8.9–10.3)
CO2: 22 mmol/L (ref 22–32)
Chloride: 114 mmol/L — ABNORMAL HIGH (ref 98–111)
Creatinine, Ser: 0.91 mg/dL (ref 0.44–1.00)
GFR calc Af Amer: >60 mL/min (ref >60)
GLUCOSE: 95 mg/dL (ref 70–99)
Potassium: 3.7 mmol/L (ref 3.5–5.1)
Sodium: 144 mmol/L (ref 135–145)

## 2018-01-25 MED ORDER — HYDROCODONE-ACETAMINOPHEN 5-325 MG PO TABS: 1.0000 | ORAL_TABLET | Freq: Four times a day (QID) | ORAL | 0 refills | Status: DC | PRN

## 2018-01-25 MED ORDER — PANTOPRAZOLE SODIUM 40 MG PO TBEC: 40.0000 mg | DELAYED_RELEASE_TABLET | Freq: Every day | ORAL | 2 refills | Status: DC

## 2018-01-25 MED ORDER — ONDANSETRON HCL 4 MG PO TABS: 4.0000 mg | ORAL_TABLET | Freq: Four times a day (QID) | ORAL | 0 refills | Status: DC | PRN

## 2018-01-25 MED ORDER — SENNOSIDES-DOCUSATE SODIUM 8.6-50 MG PO TABS: 2.0000 | ORAL_TABLET | Freq: Every day | ORAL | 3 refills | Status: DC

## 2018-01-25 MED ORDER — MIRTAZAPINE 7.5 MG PO TABS: 7.5000 mg | ORAL_TABLET | Freq: Every day | ORAL | 3 refills | Status: DC

## 2018-01-25 NOTE — Care Management Important Message (Signed)
Important Message  Patient Details  Name: AMIYLA PLOTT MRN: [id number] Date of Birth: May 21, 1940   Medicare Important Message Given:  Yes    Caren Macadam 01/25/2018, 12:45 PMImportant Message  Patient Details  Name: BROOKLEN THOMANN MRN: [id number] Date of Birth: [birth year]   Medicare Important Message Given:  Yes    Caren Macadam 01/25/2018, 12:45 PM

## 2018-01-25 NOTE — Clinical Social Work Placement (Signed)
   CLINICAL SOCIAL WORK PLACEMENT  NOTE  Date:  01/25/2018  Patient Details  Name: Suzanne Kim MRN: [id number] Date of Birth: 20-Dec-1940  Clinical Social Work is seeking post-discharge placement for this patient at the Skilled  Nursing Facility level of care (*CSW will initial, date and re-position this form in  chart as items are completed):  Yes   Patient/family provided with [facility] Clinical Social Work Department's list of facilities offering this level of care within the geographic area requested by the patient (or if unable, by the patient's family).  Yes   Patient/family informed of their freedom to choose among providers that offer the needed level of care, that participate in Medicare, Medicaid or managed care program needed by the patient, have an available bed and are willing to accept the patient.  Yes   Patient/family informed of [facility]'s ownership interest in [facility] and [facility], as well as of the fact that they are under no obligation to receive care at these facilities.  PASRR submitted to EDS on       PASRR number received on       Existing PASRR number confirmed on 01/25/18     FL2 transmitted to all facilities in geographic area requested by pt/family on       FL2 transmitted to all facilities within larger geographic area on       Patient informed that his/her managed care company has contracts with or will negotiate with certain facilities, including the following:            Patient/family informed of bed offers received.  Patient chooses bed at [facility]     Physician recommends and patient chooses bed at      Patient to be transferred to [facility] on 01/25/18.  Patient to be transferred to facility by ptar     Patient family notified on 01/25/18 of transfer.  Name of family member notified:  pt does not want csw to call family      PHYSICIAN       Additional Comment:     _______________________________________________ Althea Charon, LCSW 01/25/2018, 2:45 PM

## 2018-01-25 NOTE — Progress Notes (Signed)
Report called to Shanda Bumps ,RN at [facility]. Awaiting [facility] to transport. Will continue to monitor.

## 2018-01-25 NOTE — Discharge Summary (Addendum)
Suzanne Kim, is a 78 y.o. female  DOB 04-26-1940  MRN [id number].  Admission date:  01/22/2018  Admitting Physician  Synetta FailEndia Johnson-Pitts, MD  Discharge Date:  01/25/2018   Primary MD  Housecalls, Doctors Making  Recommendations for primary care physician for things to follow:  1) repeat CBC on Monday, 01/29/2018 2)Avoid ibuprofen/Advil/Aleve/Motrin/Goody Powders/Naproxen/BC powders/Meloxicam/Diclofenac/Indomethacin and other Nonsteroidal anti-inflammatory medications as these will make you more likely to bleed and can cause stomach ulcers, can also cause Kidney problems.    Admission Diagnosis  Muscle spasm [M62.838] Symptomatic anemia [D64.9]   Discharge Diagnosis  Muscle spasm [M62.838] Symptomatic anemia [D64.9]    Principal Problem:   Acute posthemorrhagic anemia Active Problems:   Secondary progressive multiple sclerosis (HCC)   GIB (gastrointestinal bleeding)   Symptomatic anemia   Rhabdomyolysis   Thrombocytopenia (HCC)   Osteoporosis   Heme + stool      Past Medical History:  Diagnosis Date  . Hiatal hernia   . Multiple sclerosis (HCC) 1988    Past Surgical History:  Procedure Laterality Date  . BREAST ENHANCEMENT SURGERY  1975  . ESOPHAGOGASTRODUODENOSCOPY (EGD) WITH PROPOFOL N/A 05/12/2016   Procedure: ESOPHAGOGASTRODUODENOSCOPY (EGD) WITH PROPOFOL;  Surgeon: Bernette Redbirdobert Buccini, MD;  Location: [facility];  Service: Endoscopy;  Laterality: N/A;  . ESOPHAGOGASTRODUODENOSCOPY (EGD) WITH PROPOFOL N/A 01/23/2018   Procedure: ESOPHAGOGASTRODUODENOSCOPY (EGD) WITH PROPOFOL;  Surgeon: Iva BoopGessner, Carl E, MD;  Location: [facility];  Service: Endoscopy;  Laterality: N/A;  . TONSILLECTOMY    . TOTAL VAGINAL HYSTERECTOMY    . WISDOM TOOTH EXTRACTION         HPI  from the history and physical done on the day of admission:    Chief Complaint:Weakness  WUJ:[id number]:Suzanne C Gallowayis a 78  y.o.femalewith medical history significant ofmultiple sclerosis presents with weakness. Over the past week patient has had progressive weakness from her baseline. At baseline she is able to stand and aid with her care. She has been unable to stand over the past week. She does have chronic lower extremity weakness worse on the right from her multiple sclerosis. She has had worsening muscle cramps that she has been taking aspirin /backpain relief medication. Patient does have a history of upper GI bleeding back in 2018. She states that the aspirin is the only medication really helps her discomfort. Patient was found to be anemic with a hemoglobin of 5. Patient had a [facility] that showed friable gastric mucosa. During that admission she had a hemoglobin of 7.9 and received 1 unit of blood. Upon discharge her hemoglobin was 10. Patient was hemodynamically stable here. She was heme positive. She denies any abdominal discomfort nausea vomiting. She has been suffering with upper respiratory infection of the past week. She was seen by a home doctor who did some labs and did a chest x-ray. SheDid not hear any abnormal results back.  ED Course:Patient accepted from Dr. Rush Landmarkegeler.patient was ordered 2 units of packed red blood cells. Patient was also found to have a CK  of 1914. She was given IV fluids and given some IV fentanyl for her pain.  Review of Systems:Denies chest pain shortness of abdominal pain melena nausea vomiting positive for weakness and cough all others reviewed with patient and are negative unless otherwise stated      Hospital Course:     1)Acute Gi Bleed--- EGD suggest Cameron lesions and large hiatal hernia, declines colonoscopy, GI recommends conservative treatment with PPI, repeat CBC on Monday, 01/29/2018, transfuse as clinically indicated  2)Rhabdomyolysis with mild Transaminitis--CK and LFTs improved  3)Acute on chronic Anemia--- with history of  chronic iron deficiency anemia with baseline hemoglobin around 10, on admission hemoglobin dropped down to 5.4, patient received 2 units of packed cells, hemoglobin currently stable above 8 (8.8),  repeat CBC on Monday, 01/29/2018, transfuse as clinically indicated  4) multiple sclerosis with neuromuscular deficits----patient with increased weakness physical therapist recommends SNF rehab, continue methocarbamol and gabapentin  5) anorexia/insomnia----give Remeron 7.5 mg nightly for appetite stimulation and sleep  6)Hypoxic respiratory failure----overall improving, continue to wean off oxygen   7)Social/Ethics--- patient is a DNR/DNI  Discharge Condition: stable  Follow UP  Contact information for after-discharge care    Destination    [facility] Preferred SNF .   Service:  Skilled Nursing Contact information: [address] [place] [address] [phone]               Consults obtained - Gi  Diet and Activity recommendation:  As advised  Discharge Instructions    Discharge Instructions    Call MD for:  difficulty breathing, headache or visual disturbances   Complete by:  As directed    Call MD for:  persistant dizziness or light-headedness   Complete by:  As directed    Call MD for:  persistant nausea and vomiting   Complete by:  As directed    Call MD for:  severe uncontrolled pain   Complete by:  As directed    Call MD for:  temperature >100.4   Complete by:  As directed    Diet - low sodium heart healthy   Complete by:  As directed    Discharge instructions   Complete by:  As directed    1) repeat CBC on Monday, 01/29/2018 2)Avoid ibuprofen/Advil/Aleve/Motrin/Goody Powders/Naproxen/BC powders/Meloxicam/Diclofenac/Indomethacin and other Nonsteroidal anti-inflammatory medications as these will make you more likely to bleed and can cause stomach ulcers, can also cause Kidney problems.   Increase activity slowly   Complete by:   As directed         Discharge Medications     Allergies as of 01/25/2018   No Known Allergies     Medication List    STOP taking these medications   CORICIDIN HBP COLD/FLU PO   omeprazole 20 MG capsule Commonly known as:  PRILOSEC     TAKE these medications   acetaminophen 500 MG tablet Commonly known as:  TYLENOL Take 500 mg by mouth daily as needed for mild pain, moderate pain, fever or headache.   CHLORASEPTIC SORE THROAT PO Take 1 lozenge by mouth daily as needed (cold symptoms).   benzonatate 100 MG capsule Commonly known as:  TESSALON Take 100 mg by mouth 2 (two) times daily as needed for cough.   gabapentin 100 MG capsule Commonly known as:  NEURONTIN Take 200 mg by mouth 2 (two) times daily.   guaiFENesin 600 MG 12 hr tablet Commonly known as:  MUCINEX Take 600 mg by mouth daily as needed for cough  or to loosen phlegm.   HYDROcodone-acetaminophen 5-325 MG tablet Commonly known as:  NORCO/VICODIN Take 1 tablet by mouth every 6 (six) hours as needed for moderate pain.   meclizine 25 MG tablet Commonly known as:  ANTIVERT Take 25 mg by mouth 2 (two) times daily as needed for dizziness.   methocarbamol 750 MG tablet Commonly known as:  ROBAXIN Take 750 mg by mouth 3 (three) times daily.   mirtazapine 7.5 MG tablet Commonly known as:  REMERON Take 1 tablet (7.5 mg total) by mouth at bedtime.   ondansetron 4 MG tablet Commonly known as:  ZOFRAN Take 1 tablet (4 mg total) by mouth every 6 (six) hours as needed for nausea.   pantoprazole 40 MG tablet Commonly known as:  PROTONIX Take 1 tablet (40 mg total) by mouth daily before breakfast. Start taking on:  January 26, 2018   raloxifene 60 MG tablet Commonly known as:  EVISTA Take 60 mg by mouth daily.   senna-docusate 8.6-50 MG tablet Commonly known as:  Senokot-S Take 2 tablets by mouth at bedtime.   vitamin E 400 UNIT capsule Take 400 Units by mouth daily.       Major procedures and  Radiology Reports - PLEASE review detailed and final reports for all details, in brief -   Dg Chest 2 View  Result Date: 01/22/2018 CLINICAL DATA:  Shortness of breath with cough EXAM: CHEST - 2 VIEW COMPARISON:  None. FINDINGS: Scattered calcifications are noted bilaterally, likely residua of prior granulomatous disease. No evident edema or consolidation. Heart size and pulmonary vascularity are normal. No adenopathy. There is aortic atherosclerosis. Bones appear osteoporotic. IMPRESSION: Apparent granulomatous disease with multiple foci of calcification. No frank edema or consolidation. Heart size within normal limits. There is aortic atherosclerosis. Bones osteoporotic. Aortic Atherosclerosis (ICD10-I70.0). Electronically Signed   By: Bretta BangWilliam  Woodruff III M.D.   On: 01/22/2018 16:04   Dg Chest Port 1 View  Result Date: 01/23/2018 CLINICAL DATA:  Pleural effusion, follow-up, history multiple sclerosis EXAM: PORTABLE CHEST 1 VIEW COMPARISON:  Portable exam 1648 hours compared to 01/22/2018 FINDINGS: Rotated to the LEFT. Enlargement of cardiac silhouette. Atherosclerotic calcification aorta. New opacities in the RIGHT upper lobe and LEFT lower lobe question pneumonia. Calcific density in the mid RIGHT lung unchanged. Probable small LEFT pleural effusion. No pneumothorax. Bones demineralized. IMPRESSION: Enlargement of cardiac silhouette. Probable infiltrates in RIGHT upper and LEFT lower lobes with associated LEFT pleural effusion. Electronically Signed   By: Ulyses SouthwardMark  Boles M.D.   On: 01/23/2018 17:27    Micro Results   No results found for this or any previous visit (from the past 240 hour(s)).     Today   Subjective    Diya Donzetta MattersGalloway today has no new complaints, eager to go to rehab.... Appetite is not great         Patient request DNR/DNI status   Patient has been seen and examined prior to discharge   Objective   Blood pressure (!) 103/43, pulse 75, temperature 98.1 F (36.7 C),  temperature source Oral, resp. rate 14, height 5\' 4"  (1.626 m), weight 58.3 kg, SpO2 99 %.   Intake/Output Summary (Last 24 hours) at 01/25/2018 1521 Last data filed at 01/24/2018 1754 Gross per 24 hour  Intake 60 ml  Output -  Net 60 ml    Exam Gen:- Awake Alert, no acute distress  HEENT:- [place].AT, No sclera icterus Neck-Supple Neck,No JVD,.  Lungs-  CTAB , good air movement bilaterally  CV-  S1, S2 normal, regular Abd-  +ve B.Sounds, Abd Soft, No tenderness,    Extremity/Skin:- No  edema,   good pulses Psych-affect is appropriate, oriented x3 MSK/Neuro-baseline neuromuscular deficits consistent with underlying multiple sclerosis, no further new acute Focal neuro deficits   Data Review   CBC w Diff:  Lab Results  Component Value Date   WBC 8.8 01/25/2018   HGB 8.8 (L) 01/25/2018   HCT 29.3 (L) 01/25/2018   PLT 98 (L) 01/25/2018   LYMPHOPCT 14 01/24/2018   MONOPCT 12 01/24/2018   EOSPCT 2 01/24/2018   BASOPCT 0 01/24/2018    CMP:  Lab Results  Component Value Date   NA 144 01/25/2018   K 3.7 01/25/2018   CL 114 (H) 01/25/2018   CO2 22 01/25/2018   BUN 13 01/25/2018   CREATININE 0.91 01/25/2018   PROT 5.0 (L) 01/23/2018   ALBUMIN 2.5 (L) 01/24/2018   BILITOT 0.6 01/23/2018   ALKPHOS 61 01/23/2018   AST 87 (H) 01/23/2018   ALT 48 (H) 01/23/2018  .   Total Discharge time is about 33 minutes  Shon Hale M.D on 01/25/2018 at 3:21 PM  Pager---[phone]  Go to [email or web address] - password [email or web address] for contact info  [facility] - Office  [phone]

## 2018-01-25 NOTE — Progress Notes (Signed)
Clinical Social Worker facilitated patient discharge including contacting patient family and facility to confirm patient discharge plans.  Clinical information faxed to facility and family agreeable with plan.  CSW arranged ambulance transport via [facility] to [facility].  RN to call [phone] (pt will go in rm# [phone]) for  report prior to discharge.  Clinical Social Worker will sign off for now as social work intervention is no longer needed. Please consult [place] again if new need arises.  Marrianne Mood, MSW, [employer] [phone]

## 2018-01-25 NOTE — Clinical Social Work Note (Signed)
Clinical Social Work Assessment  Patient Details  Name: Suzanne Kim MRN: [id number] Date of Birth: [birth year]  Date of referral:  01/25/18               Reason for consult:  Facility Placement, Discharge Planning                Permission sought to share information with:  Family Supports Permission granted to share information::  Yes, Verbal Permission Granted  Name::     Wendie Chess  Agency::     Relationship::  friend  Contact Information:  [phone]  Housing/Transportation Living arrangements for the past 2 months:  [place] of Information:  Patient Patient Interpreter Needed:  None Criminal Activity/Legal Involvement Pertinent to Current Situation/Hospitalization:  No - Comment as needed Significant Relationships:  Neighbor Lives with:  Self Do you feel safe going back to the place where you live?  Yes Need for family participation in patient care:  Yes (Comment)  Care giving concerns:  No family at bedside. Patient stated she has support from her neighbors    Facilities manager / plan:  CSW met patient at bedside to discuss discharge plans and offer support. CSW spoke to pt about rehab and the process of SNF. Patient stated she understands and would prefer [facility]. Patient stated she would like to have her lift chair at the facility to help with her mobility. CSW stated she will reach out to [facility] to see if they would be abel to have lift chair at facility  Employment status:  Retired Forensic scientist:  Medicare PT Recommendations:  [place] / Referral to community resources:  [place]  Patient/Family's Response to care:  Patient very pleasant during assessment   Patient/Family's Understanding of and Emotional Response to Diagnosis, Current Treatment, and Prognosis:  Patient agreeable of discharge plan to SNF  Emotional Assessment Appearance:  Appears stated  age Attitude/Demeanor/Rapport:  Engaged Affect (typically observed):  Accepting Orientation:  Oriented to Self, Oriented to Place, Oriented to  Time, Oriented to Situation Alcohol / Substance use:  Not Applicable Psych involvement (Current and /or in the community):  No (Comment)  Discharge Needs  Concerns to be addressed:  Care Coordination Readmission within the last 30 days:  No Current discharge risk:  Dependent with Mobility Barriers to Discharge:  Continued Medical Work up   [employer], LCSW 01/25/2018, 11:06 AM

## 2018-01-25 NOTE — Plan of Care (Signed)
Plan of care reviewed. 

## 2018-01-31 ENCOUNTER — Other Ambulatory Visit: Payer: Medicare Other | Admitting: *Deleted

## 2018-01-31 DIAGNOSIS — Z515 Encounter for palliative care: Secondary | ICD-10-CM

## 2018-01-31 NOTE — Progress Notes (Signed)
COMMUNITY PALLIATIVE CARE RN NOTE  PATIENT NAME: Suzanne Kim DOB: [birth year] MRN: [id number]  PRIMARY CARE PROVIDER: [facility]  RESPONSIBLE PARTY:  Acct ID - Guarantor Home Phone Work Phone Relationship Acct Type  [id number] [name] [phone]  Self P/F     [name], [place], [place] [address]    Arrived at patient's home for a scheduled home visit today at 3:30p. The back door is usually unlocked, however was locked today. Contacted patient's home phone and there was no answer. Contacted patient's cell phone (which is what she prefers) at [phone] and the voicemail on this phone has not been set up, so I was not able to leave a message. Will attempt to contact patient tomorrow again tomorrow.   [name], RN, BSN

## 2018-02-02 ENCOUNTER — Other Ambulatory Visit: Payer: Self-pay | Admitting: *Deleted

## 2018-02-02 NOTE — Patient Outreach (Signed)
[facility]) Care Management  02/02/2018  RAYLEY GAO 07-07-1940 [id number]  Onsite visit to facility.  Met with Vickii Chafe, SW she reports patient is very frail. She has private pay caregivers when she goes home, she has diagnosis of MS.  RNCM stopped by patient room, discussed [facility] care management. RNCM asked about patient doctor, it is listed as Patent attorney", she reports she does not know the exact name of her provider but they do come from "[place]".  RNCM left information regarding [facility] and will follow up with patient next week.  Royetta Crochet. Laymond Purser, MSN, RN, [employer] , [place] [phone]) Business Cell  [phone]) Toll Free Office

## 2018-02-08 ENCOUNTER — Other Ambulatory Visit: Payer: Self-pay | Admitting: *Deleted

## 2018-02-08 NOTE — Patient Outreach (Signed)
[facility]) Care Management  02/08/2018  Suzanne Kim 1940/12/14 [id number]   Onsite visit to [facility]  Met with patient in her room to review [facility] Care Management information left last week.  Patient states that she hopes that this exacerbation will end soon. She has lived with MS since she was 37 but did not have much disability until about 35 years ago.   Patient reports she had caregivers for 3 hours a day 7 days a week, then just a short time before this exacerbation, she increased to 6 hours a day 7 days a week.  Patient also reports she had Palliative care services, they made a monthly visit for an assessment.  Patient reports her neighbors are supportive and transport to appointments. She states she manages her medications and does not have any issues with affordability.   RNCM reviewed [facility] care management services again.  Patient unsure if she will go home soon, her goal is to get back to walking.  She has had her lift chair from home delivered to the facility.   Plan to continue to monitor for [facility] care management needs.  Will collaborate with [facility] UM. Royetta Crochet. Laymond Purser, MSN, AGNP-C, [place] [phone]) Business Cell  [phone]) Toll Free Office

## 2018-02-12 ENCOUNTER — Non-Acute Institutional Stay: Payer: Medicare Other | Admitting: Licensed Clinical Social Worker

## 2018-02-12 DIAGNOSIS — Z515 Encounter for palliative care: Secondary | ICD-10-CM

## 2018-02-14 ENCOUNTER — Other Ambulatory Visit: Payer: Self-pay | Admitting: *Deleted

## 2018-02-14 NOTE — Progress Notes (Signed)
COMMUNITY PALLIATIVE CARE SW NOTE  PATIENT NAME: Suzanne Kim DOB: [birth year] MRN: [id number]  PRIMARY CARE PROVIDER: [facility]  RESPONSIBLE PARTY:  Acct ID - Guarantor Home Phone Work Phone Relationship Acct Type  [id number] [name]  Self P/F     [place], [place],  [address]     PLAN OF CARE and INTERVENTIONS:             1. GOALS OF CARE/ ADVANCE CARE PLANNING:  Goal is for patient to return to her home.  She is a DNR and MOST form. 2. SOCIAL/EMOTIONAL/SPIRITUAL ASSESSMENT/ INTERVENTIONS:  SW met with patient in her room at [facility].  She stated she was moved there from the hospital after she had an MS visit at home.  Patient expressed not being able to move her legs and having severe muscle spasms.  She also reports her verbalizations are not as clear.  SW provided active listening and supportive counseling while patient expressed her concerns about returning home.  Her neighbor's daughter has been visiting her with her dog which gives patient great joy.  Also consulted the facility SW, Peggy, regarding Palliative Care involvement. 3. PATIENT/CAREGIVER EDUCATION/ COPING:  SW provided education to both patient and the facility SW regarding Palliative Care visits in the facility. 4. PERSONAL EMERGENCY PLAN:  Per facility protocol. 5. COMMUNITY RESOURCES COORDINATION/ HEALTH CARE NAVIGATION:  Patient continues to have her private caregivers three hours a day. 6. FINANCIAL/LEGAL CONCERNS/INTERVENTIONS:  None.     SOCIAL HX:  Social History   Tobacco Use  . Smoking status: Never Smoker  . Smokeless tobacco: Never Used  Substance Use Topics  . Alcohol use: No    Alcohol/week: 0.0 standard drinks    CODE STATUS:  DNR  ADVANCED DIRECTIVES: N MOST FORM COMPLETE:  Y HOSPICE EDUCATION PROVIDED:  N PPS:  Patient reports her appetite is normal.  She is having difficulty standing even with assistance. Duration of visit and  documentation:  60 minutes. Plan:  Continue assessing patients improvement at the facility.  Next visit scheduled in one month.      Creola Corn Glendora Clouatre, LCSW

## 2018-02-14 NOTE — Patient Outreach (Signed)
[facility]) Care Management  02/14/2018  Suzanne Kim September 04, 1940 [id number]   Onsite visit to [facility]  IDT meeting, patient making some progress.   Met with patient in her room. She reports she is concerned about the $176 copay, she has a supplement and wonders if it will cover. RNCM and [facility] UM nurse told her to speak with business office.  Patient still unclear what she will need to go home, she has some private duty care and supportive neighbors. She may increase hours. She does not know when she will get to leave facility.  She is getting Palliative care while at facility.   RNCM reminded patient of [facility] CM and that if she has palliative care at home, we would not work in addition to them as that would be considered a duplication, she verbalized understanding.   Before leaving facility, [facility] and this RNCM spoke with Abigail Butts in business office and let them know patient has questions about supplement, she will speak with patient.  Royetta Crochet. Laymond Purser, MSN, RN, [employer] , [place] [phone]) Business Cell  [phone]) Toll Free Office

## 2018-02-28 ENCOUNTER — Other Ambulatory Visit: Payer: Self-pay

## 2018-02-28 NOTE — Patient Outreach (Signed)
[facility]) Care Management  02/28/2018  SEETA RAYMON 1940-07-09 [id number]  EMMI: general discharge alert Referral date: 02/28/18 Referral reason: Got discharge papers: no,  Know who to call about changes in condition:  No,   Scheduled follow up:  No Insurance: Medicare Day # 1  Telephone call to patient regarding EMMI general discharge red alert. HIPAA verified with patient. Explained reason for call.  Patient states she uses [facility] for her primary MD visits. Patient states she was discharged from [facility] nursing and rehab center  on 02/24/18 but has not made follow up doctor appointment. Patient states she has the contact phone number and will call to schedule doctor home visit.  Patient states her private pay care has resumed since her discharge from the skilled nursing facility.  Patient states she is supposed to have home health care but she has not heard whether services will be starting or not.   Patient reports she has her medications and takes them as prescribed.  Patient denies any new symptoms at this time.  RNCM offered to contact [facility] nursing center to obtain name of home health agency and assist with care coordination. Patient verbally agreed.  RNCM advised patient to notify MD of any changes in condition prior to scheduled appointment. RNCM advised patient to contact [facility] to schedule post hospital discharge follow up visit.  RNCM provided contact name and number: [phone] or main office number [phone] and 24 hour nurse advise line [phone] mailed to patient.  RNCM verified patient aware of 911 services for urgent/ emergent needs. Patient reports she received a [facility] care management folder from the [facility] post acute care nurse while at [facility] nursing and rehab center.   RNCM contacted [facility] skilled nursing facility and spoke with Peggy.  Peggy verified patient was [facility]. RNCM contacted [facility] and spoke with [place].  Trula Ore states she spoke with patient on yesterday and confirmed order for skilled nursing, physical  And occupational therapy and a CNA.  Trula Ore states patient declined skilled nursing and CNA.  Trula Ore states patients tentative start of care date  for physical/ occupational therapy is 03/02/18.  Trula Ore states she will contact patient to confirm.  RNCM contacted patient to inform her the name of Home health agency providing her services.  Also informed patient of tentative start of care date.   RNCM offered to give patient name and contact phone number for [facility]. Patient states she does not have anything to write with. Requested RNCm contact her neighbor, Loistine Simas at [phone].  RNCM contacted Ms. Loistine Simas as requested by patient. HIPAA verified by Loistine Simas for patient. Contact phone number and name for [facility] given to [place]. RNCM requested Loistine Simas give this contact information to patient. Loistine Simas verbalized understanding and agreement.   PLAN; RNCM will follow up with patient within 4 business days to confirm home health services have started.   [name] RN,BSN,CCM [facility] Telephonic  [phone]      PLAN:

## 2018-03-05 ENCOUNTER — Ambulatory Visit: Payer: Self-pay

## 2018-03-06 ENCOUNTER — Other Ambulatory Visit: Payer: Self-pay

## 2018-03-06 NOTE — Patient Outreach (Signed)
[facility]) Care Management  03/06/2018  Suzanne Kim [birth year] [id number]  EMMI: general discharge alert Referral date: 02/28/18 Referral reason: Got discharge papers: no,  Know who to call about changes in condition:  No,   Scheduled follow up:  No Insurance: Medicare Day # 1  Telephone call to patient regarding EMMI general discharge follow up. HIPAA verified with patient.  Patient states her home health services for physical and occupational therapy have started. Patient states she called [facility] last week to attempt to set  Up appointment. She states she has not heard back.  Patient states she does not see any other doctors other than the primary MD with [facility]. Patient states she also has a Pessory that needs to be checked.  Patient denies having any issues or concerns at this time.  Denies any new symptoms.  RNCM offered to call [facility] for patient to assist with coordination of care. Patient verbally agreed.  RNCM called and left voice message at [facility] for a return call.   PLAN;  RNCM will follow up with patient and [facility] within 4 business days.   [name] RN,BSN,CCM [facility] Telephonic  [phone]

## 2018-03-07 ENCOUNTER — Other Ambulatory Visit: Payer: Self-pay

## 2018-03-07 ENCOUNTER — Ambulatory Visit: Payer: Self-pay

## 2018-03-07 NOTE — Patient Outreach (Signed)
[facility]) Care Management  03/07/2018  Suzanne Kim 1940-02-17 [id number]   EMMI:general discharge alert Referral date:02/28/18 Referral reason:Got discharge papers: no, Know who to call about changes in condition: No, Scheduled follow up: No Insurance: Medicare Day #1  Received call from [place] with [facility].  [name] states patient sees [name], nurse practitioner with [facility]. She states she will need to contact [name] to determine her availability for hospital follow up visit on tomorrow 03/08/18.  [name] states she will return call to this [facility] when she has appointment time.   PLAN; RNCM will await return call from [place] with [facility].   [name] RN,BSN,CCM [facility] Telephonic  [phone]

## 2018-03-09 ENCOUNTER — Other Ambulatory Visit: Payer: Self-pay

## 2018-03-09 NOTE — Patient Outreach (Signed)
[facility]) Care Management  03/09/2018  Suzanne Kim 09/14/1940 [id number]  EMMI:general discharge alert Referral date:02/28/18 Referral reason:Got discharge papers: no, Know who to call about changes in condition: No, Scheduled follow up: No Insurance: Medicare Day #1  Received return call from [place] with [facility] stating patient has an in home appointment scheduled for 03/15/18 at 2:00pm.   Telephone call to patient regarding EMMI general discharged. HIPAA verified with patient. RNCM confirmed patient has appointment with [facility] on 03/15/18 at 2:00pm. Patient verified. Patient denies any further needs or concerns at this time.    PLAN:  RNCM will close patient due to patient being assessed and having no further needs.  RNCM will send patient [facility] care management brochure/ magnet   [name] RN,BSN,CCM [facility] Telephonic  [phone]

## 2018-03-16 ENCOUNTER — Other Ambulatory Visit: Payer: Medicare Other | Admitting: Licensed Clinical Social Worker

## 2018-03-16 DIAGNOSIS — Z515 Encounter for palliative care: Secondary | ICD-10-CM

## 2018-03-19 NOTE — Progress Notes (Signed)
COMMUNITY PALLIATIVE CARE SW NOTE  PATIENT NAME: Suzanne Kim DOB: 1940-05-11 MRN: [id number]  PRIMARY CARE PROVIDER: [facility]  RESPONSIBLE PARTY:  Acct ID - Guarantor Home Phone Work Phone Relationship Acct Type  [id number] [name]  Self P/F     [place], [place], [place] [address]     PLAN OF CARE and INTERVENTIONS:             1. GOALS OF CARE/ ADVANCE CARE PLANNING:  Goal is for patient to remain in her home.  She has a MOST form and DNR. 2. SOCIAL/EMOTIONAL/SPIRITUAL ASSESSMENT/ INTERVENTIONS:  SW met with patient and her private caregivers in her home.  She was discharged from [facility] about two weeks ago.  Patient expressed being very pleased that she has returned home.  Her caregivers hours have increased in the evening.  Patient stated, "I still can't walk."  SW provided active listening and supportive counseling.  She denied pain. 3. PATIENT/CAREGIVER EDUCATION/ COPING:  Patient copes by expressing her feelings openly. 4. PERSONAL EMERGENCY PLAN:  Patient utilizes her life alert pendent.  She has caregivers and a neighbor that assist with care. 5. COMMUNITY RESOURCES COORDINATION/ HEALTH CARE NAVIGATION:  Patient has caregivers in the mid-morning and the evening. 6. FINANCIAL/LEGAL CONCERNS/INTERVENTIONS:  None.     SOCIAL HX:  Social History   Tobacco Use  . Smoking status: Never Smoker  . Smokeless tobacco: Never Used  Substance Use Topics  . Alcohol use: No    Alcohol/week: 0.0 standard drinks    CODE STATUS:  DNR  ADVANCED DIRECTIVES: N MOST FORM COMPLETE:  Y HOSPICE EDUCATION PROVIDED:  N PPS:  Patient states that her appetite is normal.  She cannot stand and requires assistance with transfers. Duration of visit and documentation:  45 minutes.      Creola Corn Aradhya Shellenbarger, LCSW

## 2018-04-03 ENCOUNTER — Telehealth: Payer: Self-pay | Admitting: Licensed Clinical Social Worker

## 2018-04-03 NOTE — Telephone Encounter (Signed)
Palliative Care SW phoned patient to schedule a home visit.  She expressed concern regarding the recent pandemic and declined a visit.  She agreed to have SW phone her in a few weeks to assess.

## 2018-05-14 ENCOUNTER — Other Ambulatory Visit: Payer: Medicare Other | Admitting: *Deleted

## 2018-05-14 ENCOUNTER — Other Ambulatory Visit: Payer: Self-pay

## 2018-05-14 DIAGNOSIS — Z515 Encounter for palliative care: Secondary | ICD-10-CM

## 2018-05-17 NOTE — Progress Notes (Signed)
COMMUNITY PALLIATIVE CARE RN NOTE  PATIENT NAME: MONICIA MARROQUIN DOB: 05-10-1940 MRN: [id number]  PRIMARY CARE PROVIDER: [facility]  RESPONSIBLE PARTY:  Acct ID - Guarantor Home Phone Work Phone Relationship Acct Type  [id number] [name] [phone]  Self P/F     [name], [place], [place] [address]   Due to the COVID-19 crisis, this virtual check-in visit was done via telephone from my office and it was initiated and consent by this patient and or family.  PLAN OF CARE and INTERVENTION:  1. ADVANCE CARE PLANNING/GOALS OF CARE: Goal is for patient to remain in her home for as long as possible. She is a DNR. 2. PATIENT/CAREGIVER EDUCATION: Reinforced Safe Transfers 3. DISEASE STATUS: Virtual check in visit completed with patient via telephone. Patient denies pain. She reports that she was having issues with her bottom having a burning/stinging sensation. She is now using barrier cream, which she states has worked well. She continues to sit in her recliner for the majority of the day. She continues with home health for CNA services for about 3 hours/day, but now requires 2 aids d/t increased weakness. She is now unable to stand or bear any weight so requires 2 person assistance with all transfers. They do place patient in her transporter wheelchair when requested so she can get on her computer. She also has her neighbor's daughter who comes in the evenings to help her get ready for bed. She continues to sleep in a recliner. Her intake remains normal. Denies dysphagia. She is incontinent of both bowel and bladder and wears adult briefs. She spoke about her stay at  [facility] back in January and is pleased that she was able to return home. She denies any needs at this  time. Will continue to monitor.  HISTORY OF PRESENT ILLNESS:  This is a 78 yo female who resides at home alone. She has a very supportive neighbor and caregivers. Palliative Care Team  continues to follow patient. Will continue to check in monthly and PRN.  CODE STATUS: DNR  ADVANCED DIRECTIVES: N MOST FORM: yes PPS: 30%    (Duration of visit and documentation 30 minutes)    [name], RN BSN

## 2018-05-29 ENCOUNTER — Other Ambulatory Visit: Payer: Medicare Other | Admitting: Licensed Clinical Social Worker

## 2018-05-29 ENCOUNTER — Other Ambulatory Visit: Payer: Self-pay

## 2018-05-29 DIAGNOSIS — Z515 Encounter for palliative care: Secondary | ICD-10-CM

## 2018-05-30 NOTE — Progress Notes (Signed)
COMMUNITY PALLIATIVE CARE SW NOTE  PATIENT NAME: Suzanne Kim DOB: February 06, 1940 MRN: [id number]  PRIMARY CARE PROVIDER: [facility]  RESPONSIBLE PARTY:  Acct ID - Guarantor Home Phone Work Phone Relationship Acct Type  [id number] Suzanne Kim* [phone]  Self P/F     [address], [place], [place] [address]   Due to the COVID-19 crisis, this virtual check-in visit was done via telephone from my office and it was initiated and consent given by thispatient.  PLAN OF CARE and INTERVENTIONS:             1. GOALS OF CARE/ ADVANCE CARE PLANNING:  Patient wishes to remain in her home.  Patient has a DNR and MOST form. 2. SOCIAL/EMOTIONAL/SPIRITUAL ASSESSMENT/ INTERVENTIONS:  SW conducted a virtual check-in visit with patient in her home.  She continues to spend her daytime hours watching tv in her living room. She stated her bottom was irritated, but the caregivers are providing barrier cream.  Her caregivers take her to the bathroom in the morning, dress her and prepare breakfast.    She also has caregivers in the evening who prepare her for bed.  She felt that she was not making any progress walking.  She will wake up in the middle of the night occasionally and will remain awake for a few hours.  She had no other concerns at this time.   3. PATIENT/CAREGIVER EDUCATION/ COPING:  Patient expresses her feelings openly. 4. PERSONAL EMERGENCY PLAN:  Patient has a life alert pendent that she will use. 5. COMMUNITY RESOURCES COORDINATION/ HEALTH CARE NAVIGATION:  Patient has private caregivers two times per day. 6. FINANCIAL/LEGAL CONCERNS/INTERVENTIONS:  None.     SOCIAL HX:  Social History   Tobacco Use  . Smoking status: Never Smoker  . Smokeless tobacco: Never Used  Substance Use Topics  . Alcohol use: No    Alcohol/week: 0.0 standard drinks    CODE STATUS:   DNR ADVANCED DIRECTIVES: N MOST FORM COMPLETE:  Y HOSPICE EDUCATION PROVIDED:  N PPS: Patient's appetite is  normal.  She requires assistance with tranfers. Duration of visit and documentation:  40 minutes.      Suzanne Kohler [name], LCSW

## 2018-06-21 ENCOUNTER — Other Ambulatory Visit: Payer: Self-pay

## 2018-06-21 ENCOUNTER — Other Ambulatory Visit: Payer: Medicare Other | Admitting: *Deleted

## 2018-06-21 DIAGNOSIS — Z515 Encounter for palliative care: Secondary | ICD-10-CM

## 2018-06-29 NOTE — Progress Notes (Addendum)
COMMUNITY PALLIATIVE CARE RN NOTE  PATIENT NAME: Suzanne Kim DOB: 1940/08/25 MRN: [id number]  PRIMARY CARE PROVIDER: [facility]  RESPONSIBLE PARTY:  Acct ID - Guarantor Home Phone Work Phone Relationship Acct Type  [id number] [name]* [phone]  Self P/F     [address], [place], [place] [address]   Due to the COVID-19 crisis, this virtual check-in visit was done via telephone from my office and it was initiated and consent by this patient and or family.  PLAN OF CARE and INTERVENTION:  1. ADVANCE CARE PLANNING/GOALS OF CARE: Goal is for patient to remain in her home. She is a DNR. 2. PATIENT/CAREGIVER EDUCATION: Reinforced Safe Transfers and Skin Breakdown Prevention/Management 3. DISEASE STATUS: Virtual check-in visit completed with patient today. Patient is very pleasant and engaging. She remains alert and oriented x 4. She denies pain. She continues to spend much of her day in her recliner in the living room watching TV.Marland Kitchen She also sleeps in her recliner, as it is more comfortable for patient d/t the curvature in her back. She states that her condition remains the same overall. She continues to require 2 person assistance for transfers and has 2 hired caregivers at a time helping her during the day. She is total care for all ADLs, except feeding. She is intermittently incontinent of both bowel and bladder. She uses a bedside commode. They also place patient in her wheelchair so that she can sit at her computer, as she likes to "surf" the internet and pays her bills this way. She reports a tingling sensation at times to her bottom, where she sits all the time. Caregivers are placing a barrier cream on her bottom several times per day that is effective. Her intake remains good, along with good fluid intake. Will continue to monitor.   HISTORY OF PRESENT ILLNESS: This is a 78 yo female who lives in her home. She has hired caregivers twice daily for a few hours to  assist with her personal care. She has a very supportive neighbor. Palliative Care Team continues to follow patient. Will continues to check in with patient monthly and PRN.   CODE STATUS: DNR  ADVANCED DIRECTIVES: Y (HCPOA, Living Will) MOST FORM: yes PPS: 30%   (Duration of visit and documentation 45 minutes)    Daryl Eastern, RN BSN

## 2018-07-18 IMAGING — CT CT RENAL STONE PROTOCOL
2 of 3 series · 17 of 46 positions shown, 19 images · non-contrast
Comparison: None.

CLINICAL DATA: New onset fecal incontinence and diarrhea.
Hematuria.

EXAM:
CT ABDOMEN AND PELVIS WITHOUT CONTRAST
TECHNIQUE: Multidetector CT imaging of the abdomen and pelvis was performed
following the standard protocol without IV contrast.

[Series 3: lung · axial · 0.87mm/px · z∈[-95,-19]mm · 14 of 44 slices shown, 16 images]
[im 3/44  soft-tissue]
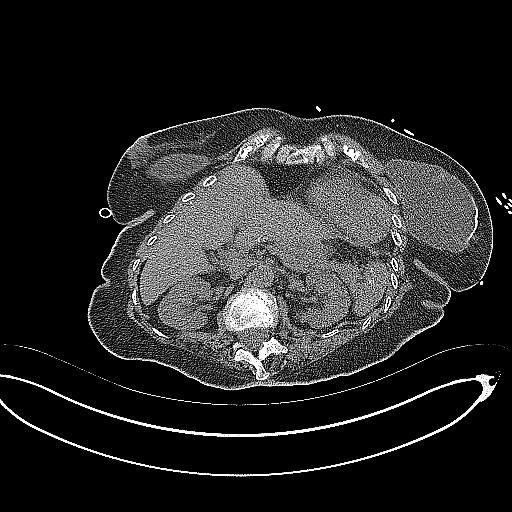
[im 3/44  bone]
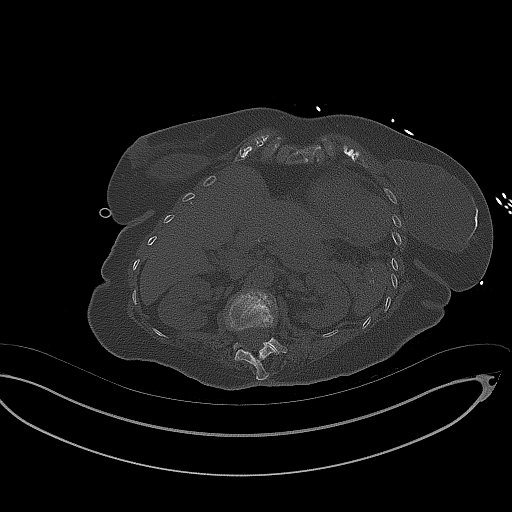
[im 6/44  soft-tissue]
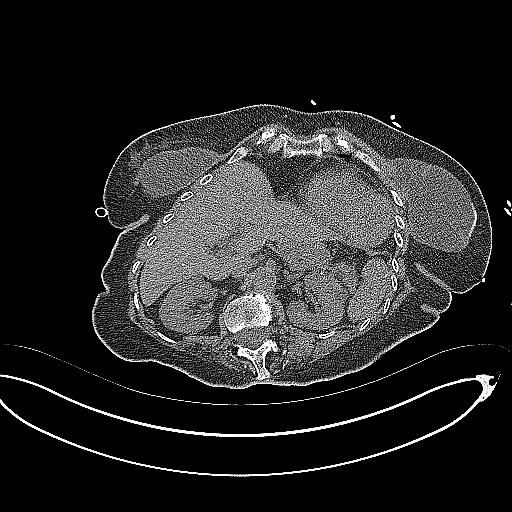
[im 9/44  soft-tissue]
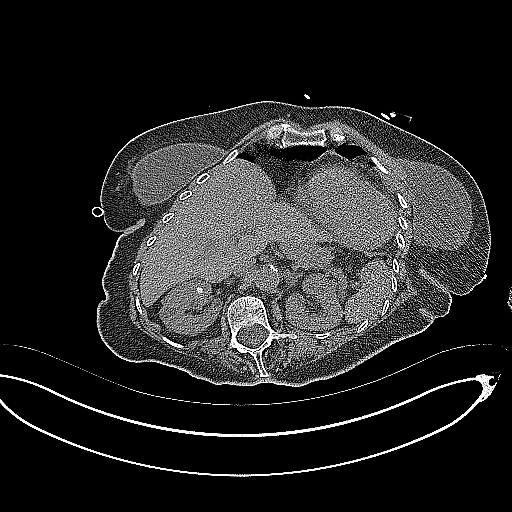
[im 12/44  soft-tissue]
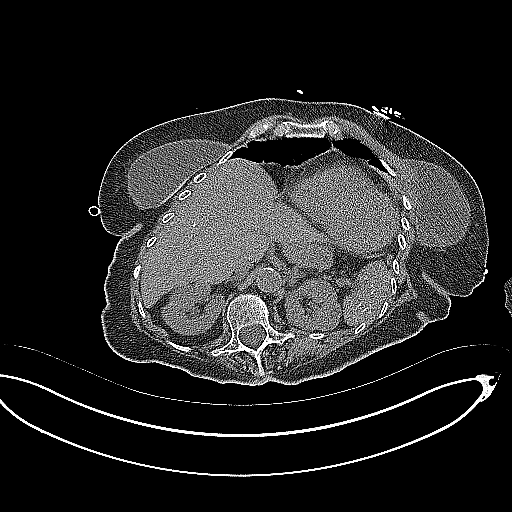
[im 14/44  soft-tissue]
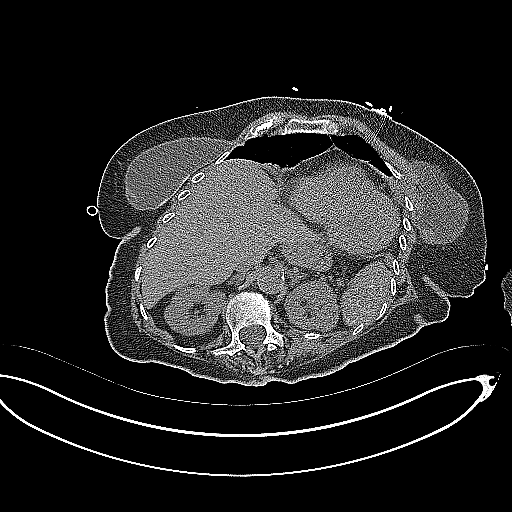
[im 17/44  soft-tissue]
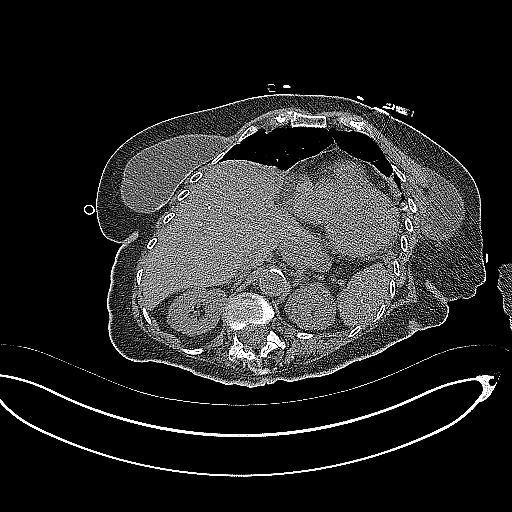
[im 20/44  soft-tissue]
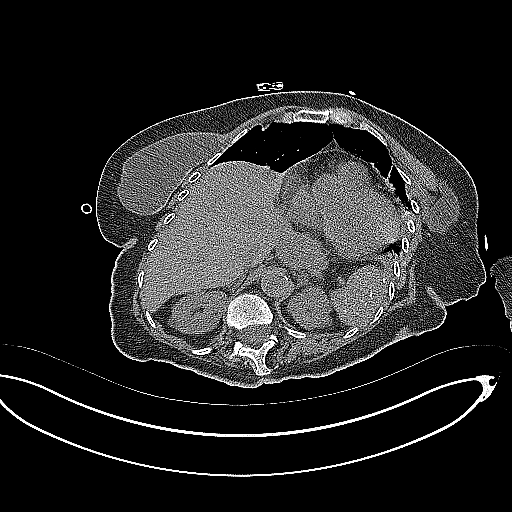
[im 24/44  soft-tissue]
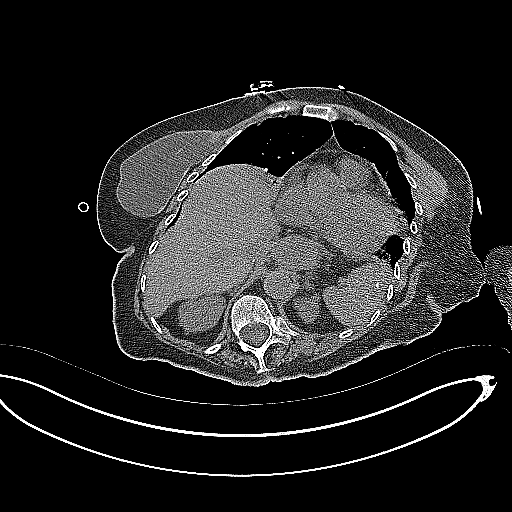
[im 27/44  soft-tissue]
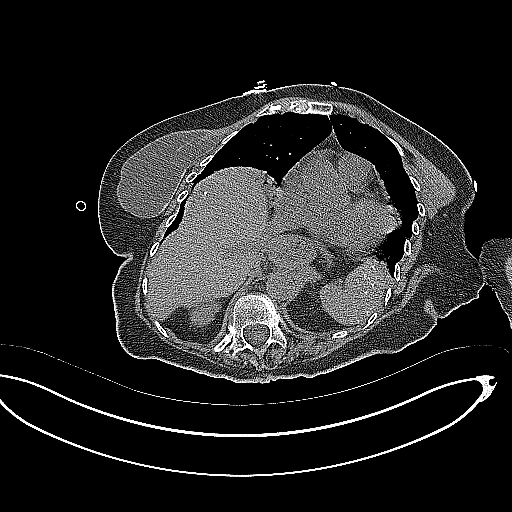
[im 27/44  bone]
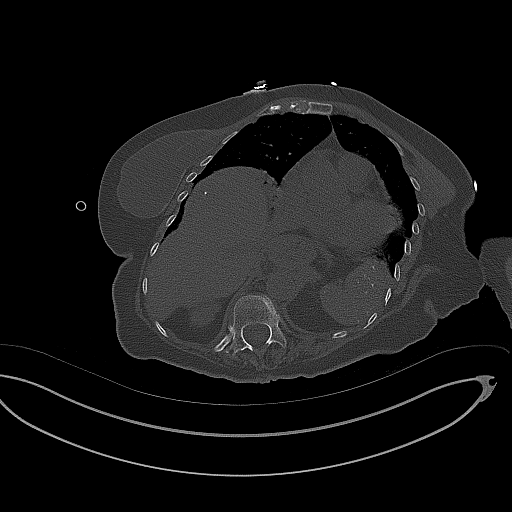
[im 30/44  soft-tissue]
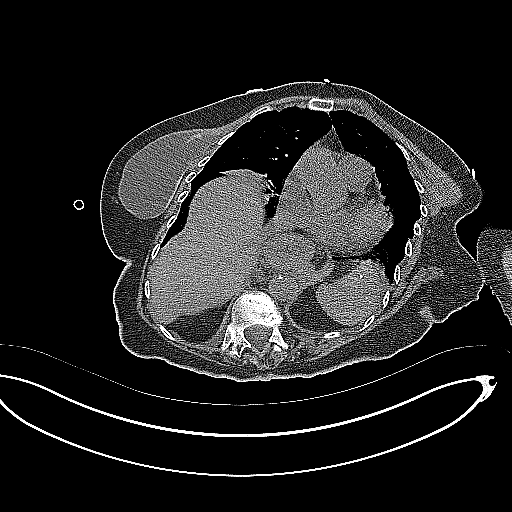
[im 32/44  soft-tissue]
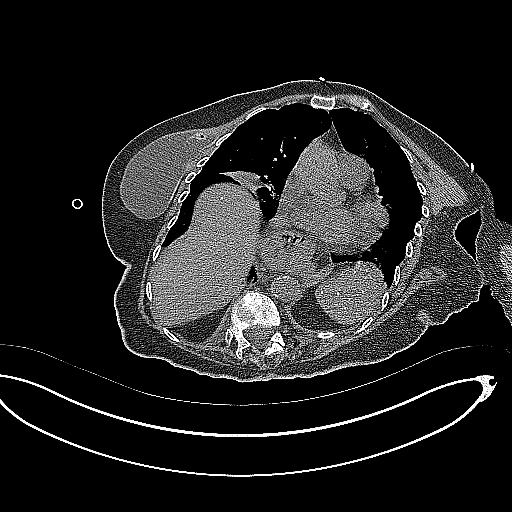
[im 35/44  soft-tissue]
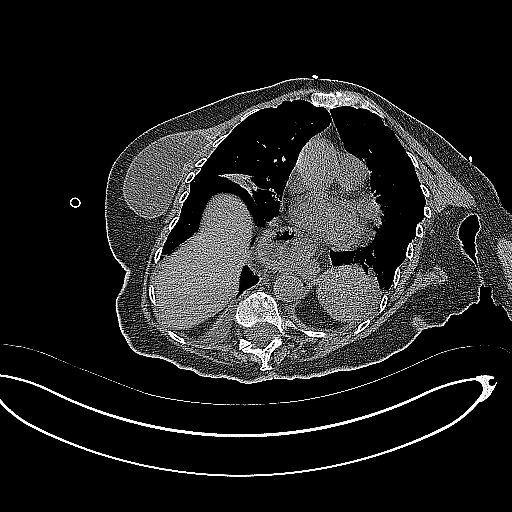
[im 38/44  soft-tissue]
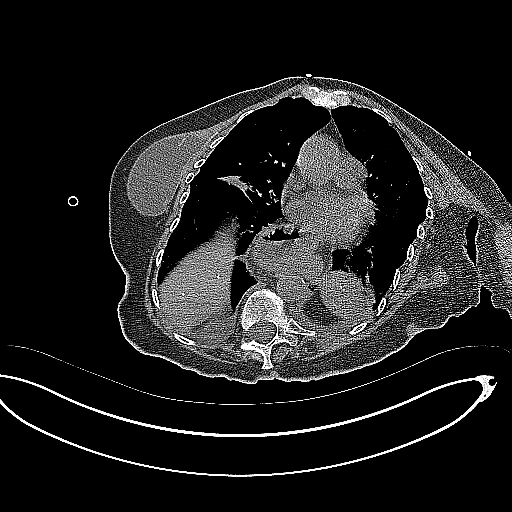
[im 41/44  soft-tissue]
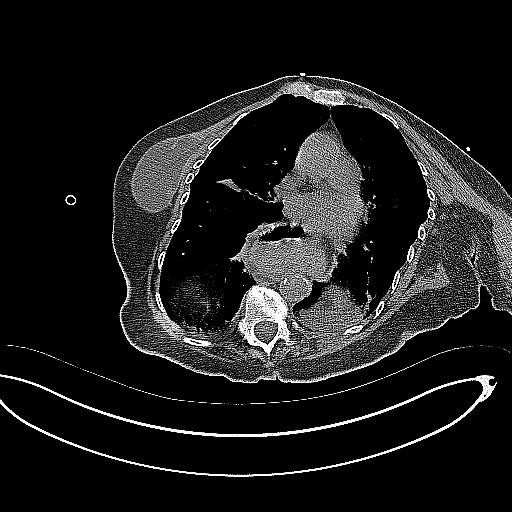

[Series 4: coronal · coronal · 0.81mm/px · 3 of 144 slices shown]
[im 48/144  soft-tissue]
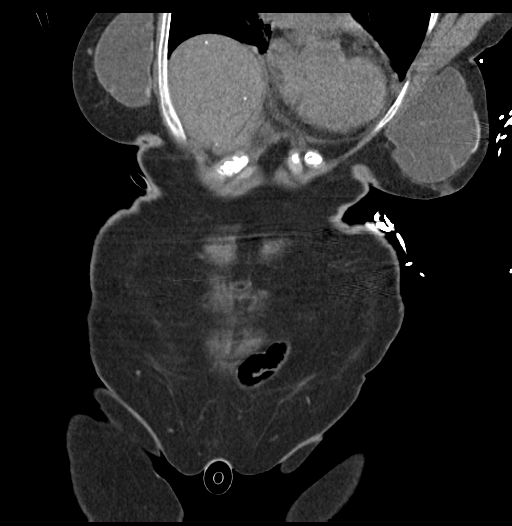
[im 64/144  soft-tissue]
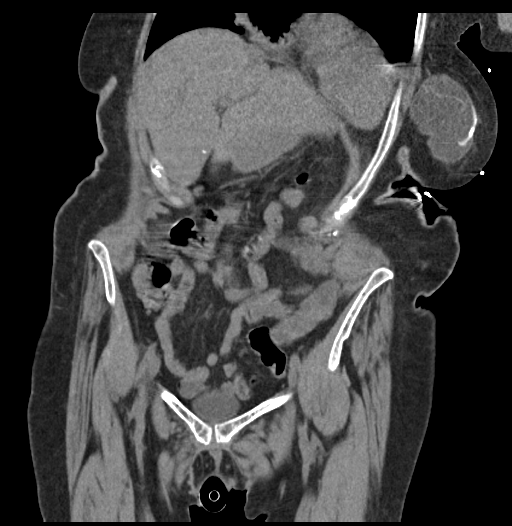
[im 80/144  soft-tissue]
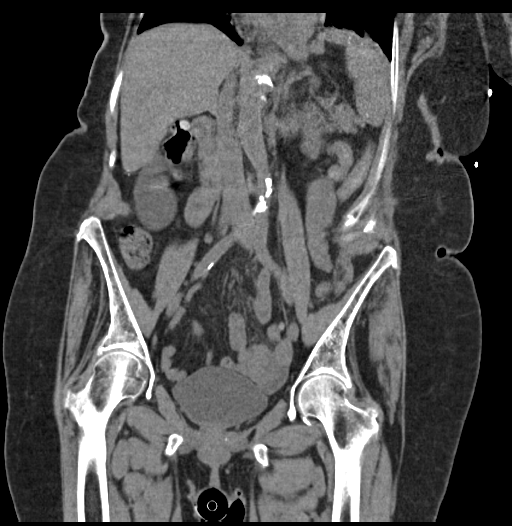

[17 of 46 positions shown; findings below may reference images not displayed]

FINDINGS: Lower chest: Mild bibasilar chronic interstitial disease. Trace
right pleural effusion.

Hepatobiliary: No focal hepatic mass. Punctate calcifications
throughout the liver as can be seen with prior granulomatous
disease. Normal gallbladder.

Pancreas: Unremarkable. No pancreatic ductal dilatation or
surrounding inflammatory changes.

Spleen: No focal mass. Punctate calcifications throughout the spleen
as can be seen with prior granulomatous disease.

Adrenals/Urinary Tract: Normal adrenal glands. No obstructive
uropathy. No urolithiasis. Course dystrophic calcification in the
anterior interpolar aspect of the right kidney. Normal bladder.

Stomach/Bowel: No bowel dilatation or bowel wall thickening. No
pneumatosis, pneumoperitoneum or portal venous gas. Normal appendix.
Left inguinal hernia containing small bowel without dilatation.
Sigmoid diverticulosis without evidence of diverticulitis.

Vascular/Lymphatic: Abdominal aortic atherosclerosis. Abdominal
aorta is normal in caliber.

Reproductive: Prior hysterectomy. Vaginal pessary device is present.

Other: No fluid collection or hematoma.

Musculoskeletal: No acute osseous abnormality. Degenerative disc
disease with disc height loss throughout the lumbar spine. S-shaped
scoliosis of the thoracolumbar spine.
IMPRESSION: 1. No urolithiasis or obstructive uropathy.
2. Left inguinal hernia containing a loop of small bowel without
resulting bowel obstruction.
3.  Aortic Atherosclerosis ([id number]-170.0)

## 2018-07-25 ENCOUNTER — Telehealth: Payer: Self-pay | Admitting: Licensed Clinical Social Worker

## 2018-07-25 NOTE — Telephone Encounter (Signed)
Palliative Care SW phone patient to schedule a visit.  There was no answer.

## 2018-07-26 ENCOUNTER — Other Ambulatory Visit: Payer: Medicare Other | Admitting: Licensed Clinical Social Worker

## 2018-07-30 NOTE — Progress Notes (Signed)
COMMUNITY PALLIATIVE CARE SW NOTE  PATIENT NAME: Suzanne Kim DOB: February 04, 1940 MRN: [id number]  PRIMARY CARE PROVIDER: [facility]  RESPONSIBLE PARTY:  Acct ID - Guarantor Home Phone Work Phone Relationship Acct Type  [id number] [name]* [phone]  Self P/F     [address], [place], [place] [address]   Due to the COVID-19 crisis, this virtual check-in visit was done via telephone from my office and it was initiated and consent given by thispatient.  PLAN OF CARE and INTERVENTIONS:             1. GOALS OF CARE/ ADVANCE CARE PLANNING:  Goal is for patient to remain in her home.  She has a DNR and MOST form. 2. SOCIAL/EMOTIONAL/SPIRITUAL ASSESSMENT/ INTERVENTIONS:  SW conducted a virtual check-in visit in patient's home.  She stated she was the "same" and required assistance will all ADLs except eating.  She did participate in conversation around the food she enjoys.  SW provided active listening and supportive counseling while patient discussed missing SW and RN visits. 3. PATIENT/CAREGIVER EDUCATION/ COPING:  Patient copes by expressing her feelings openly.  She wishes to remain in control of her situation. 4. PERSONAL EMERGENCY PLAN:  She will contact her neighbor, Venetia Night, for assistance.  She also has a Life Alert pendent. 5. COMMUNITY RESOURCES COORDINATION/ HEALTH CARE NAVIGATION:  She has private caregivers two times a day. 6. FINANCIAL/LEGAL CONCERNS/INTERVENTIONS:  None.     SOCIAL HX:  Social History   Tobacco Use  . Smoking status: Never Smoker  . Smokeless tobacco: Never Used  Substance Use Topics  . Alcohol use: No    Alcohol/week: 0.0 standard drinks    CODE STATUS:  DNR ADVANCED DIRECTIVES: N MOST FORM COMPLETE:  Yes HOSPICE EDUCATION PROVIDED: N  PPS:  Patient reports her appetite is normal.  She needs assistance with transfers. Duration of visit and documentation:  45 minutes.     Creola Corn Koury Roddy, LCSW

## 2018-08-24 ENCOUNTER — Other Ambulatory Visit: Payer: Medicare Other | Admitting: *Deleted

## 2018-08-24 ENCOUNTER — Other Ambulatory Visit: Payer: Self-pay

## 2018-08-24 DIAGNOSIS — Z515 Encounter for palliative care: Secondary | ICD-10-CM

## 2018-08-27 NOTE — Progress Notes (Signed)
COMMUNITY PALLIATIVE CARE RN NOTE  PATIENT NAME: Suzanne Kim DOB: 04/30/1940 MRN: [id number]  PRIMARY CARE PROVIDER: [facility]  RESPONSIBLE PARTY:  Acct ID - Guarantor Home Phone Work Phone Relationship Acct Type  [id number] Suzanne Kim* [phone]  Self P/F     [address]uz[address] Kim Suz[address], [place], [place] [address]   Due to the COVID-19 crisis, this virtual check-in visit was done via telephone from my office and it was initiated and consent by this patient and or family.  PLAN OF CARE and INTERVENTION:  1. ADVANCE CARE PLANNING/GOALS OF CARE: Goal is for patient to remain in her home and avoid hospitalizations. 2. PATIENT/CAREGIVER EDUCATION: None 3. DISEASE STATUS: Virtual check-in visit completed via telephone with patient. She sounds very pleasant and conversational. She denies pain at this time, but does state that it has become more difficult for her to sit up in her wheelchair d/t her severe kyphosis, which does cause pain. She is taking PRN Tylenol which manages her pain well when needed. She spends her day sitting in her recliner and also sleeps there. She now requires a Hoyer lift for transfers to the bedside commode. She continues with two caregivers 7 days/week for 3 hours each day. She says that she has now given the responsibility of her finances to her neighbor. Her bottom has cleared with the use of barrier cream mixed with Vaseline. She also has a chair cushion. She continues with a good appetite. No dysphagia reported. She has no current issues or concerns. She continues to prefer telephone visits, as she is trying to keep visitors at a minimum. Will continue to monitor.   HISTORY OF PRESENT ILLNESS:  This is a 78 yo female who resides at home alone. She has a very supportive neighbor. Palliative Care team continues to follow patient. Will continue to check in with patient monthly and PRN.  CODE STATUS: DNR  ADVANCED DIRECTIVES: Y MOST FORM: yes PPS:  30%   (Duration of visit and documentation 45 minutes)   Suzanne Eastern, RN BSN

## 2018-09-28 ENCOUNTER — Other Ambulatory Visit: Payer: Medicare Other | Admitting: Licensed Clinical Social Worker

## 2018-09-28 ENCOUNTER — Other Ambulatory Visit: Payer: Self-pay

## 2018-09-28 DIAGNOSIS — Z515 Encounter for palliative care: Secondary | ICD-10-CM

## 2018-10-01 NOTE — Progress Notes (Signed)
COMMUNITY PALLIATIVE CARE SW NOTE  PATIENT NAME: Suzanne Kim DOB: 1940/11/09 MRN: [id number]  PRIMARY CARE PROVIDER: [facility]  RESPONSIBLE PARTY:  Acct ID - Guarantor Home Phone Work Phone Relationship Acct Type  [id number] [name]* [phone]  Self P/F     [place] [address], [place], [place] [address]   Due to the COVID-19 crisis, this virtual check-in visit was done via telephone from my office and it was initiated and consent given by thispatient.  PLAN OF CARE and INTERVENTIONS:             1. GOALS OF CARE/ ADVANCE CARE PLANNING:  Patient wishes to remain in her home.  She has a MOST form and DNR. 2. SOCIAL/EMOTIONAL/SPIRITUAL ASSESSMENT/ INTERVENTIONS:  SW conducted a virtual check-in visit with patient in her home.  She stated she was recovering from pneumonia last week.  She is currently on ABT.  She had a home ex-ray after her caregiver was diagnosed with pneumonia.  She denied any discomfort at this time.  She continues to have two private caregivers at a time twice a day.   3. PATIENT/CAREGIVER EDUCATION/ COPING:  Patient copes by controlling her environment as much as possible. 4. PERSONAL EMERGENCY PLAN:  She has a life alert pendant. 5. COMMUNITY RESOURCES COORDINATION/ HEALTH CARE NAVIGATION:  Patient has private caregivers two times a day. 6. FINANCIAL/LEGAL CONCERNS/INTERVENTIONS:  None.     SOCIAL HX:  Social History   Tobacco Use  . Smoking status: Never Smoker  . Smokeless tobacco: Never Used  Substance Use Topics  . Alcohol use: No    Alcohol/week: 0.0 standard drinks    CODE STATUS: DNR  ADVANCED DIRECTIVES: N MOST FORM COMPLETE:  Yes HOSPICE EDUCATION PROVIDED: No PPS:  Her appetite is normal.  She requires assistance with transfers. Duration of visit and documentation:  45 minutes.      Creola Corn Evelisse Szalkowski, LCSW

## 2018-10-31 ENCOUNTER — Other Ambulatory Visit: Payer: Medicare Other | Admitting: *Deleted

## 2018-10-31 ENCOUNTER — Other Ambulatory Visit: Payer: Self-pay

## 2018-10-31 DIAGNOSIS — Z515 Encounter for palliative care: Secondary | ICD-10-CM

## 2018-10-31 NOTE — Progress Notes (Signed)
PATIENT NAME: KATRINKA HERBISON DOB: 03-07-1940 MRN: [id number]  PRIMARY CARE PROVIDER: [facility]  RESPONSIBLE PARTY:  Acct ID - Guarantor Home Phone Work Phone Relationship Acct Type  [id number] [name]* [phone]  Self P/F     [place] [address], [place], [place] [address]   Due to the COVID-19 crisis, this virtual check-in visit was done via telephone from my office and it was initiated and consent by this patient and or family.  PLAN OF CARE and INTERVENTION:  1. ADVANCE CARE PLANNING/GOALS OF CARE: Goal is for patient to remain in her home. She is a DNR. 2. PATIENT/CAREGIVER EDUCATION: Safe Transfers 3. DISEASE STATUS: Virtual check-in visit completed via telephone. Patient denies pain at this time. She does experience occasional back pain and will take Tylenol PRN if needed. She says that recently she has been having issues with her breathing, mainly with exertion. She reports that about a month ago, she had a chest x-ray that showed Pneumonia after her caregiver was diagnosed with the same. She was placed on antibiotics for 10 days. I recommended that she reach out to [facility] to make them aware. She says she ordered a pulse ox that is arriving today and wants to check her readings prior to calling them. She still requests that Palliative care visits be done via telephone until the pandemic is over. She states that she is going to call her PCP to have her flu shot given next week and will talk to them about her shortness of breath then. She continues with a hired caregiver that comes daily for 3 hours in the am, and to assist her in getting ready for bed in the evenings. She now only has 1 caregiver coming at a time vs 2 in the past. She is transferred via [facility]. She requires 1 person assistance with bathing, dressing and toileting. She remains able to feed herself. She has a good appetite and fluid intake. Denies dysphagia. Occasional urinary incontinence  and she wears adult briefs. Barrier cream being applied to her bottom. No active skin issues. Will continue to monitor.  HISTORY OF PRESENT ILLNESS:  This is a 78 yo female who lives in her home alone. She has hired caregivers twice daily to assist with meals, personal care and household chores. Palliative care team continues to follow patient. Will continue to visit patient monthly and PRN.  CODE STATUS: DNR ADVANCED DIRECTIVES: N MOST FORM: yes PPS: 30%   (Duration of visit and documentation 45 minutes)   Daryl Eastern, RN BSN

## 2018-11-19 ENCOUNTER — Telehealth: Payer: Self-pay | Admitting: Licensed Clinical Social Worker

## 2018-11-19 NOTE — Telephone Encounter (Signed)
Palliative Care SW phoned patient to assess coping and needs.  There was no answer.  Will attempt again at a later time.

## 2018-11-22 ENCOUNTER — Other Ambulatory Visit: Payer: Medicare Other | Admitting: Licensed Clinical Social Worker

## 2018-11-22 ENCOUNTER — Encounter: Payer: Self-pay | Admitting: Licensed Clinical Social Worker

## 2018-11-22 DIAGNOSIS — Z515 Encounter for palliative care: Secondary | ICD-10-CM

## 2018-11-22 NOTE — Progress Notes (Signed)
COMMUNITY PALLIATIVE CARE SW NOTE  PATIENT NAME: Suzanne Kim DOB: 08-27-1940 MRN: [id number]  PRIMARY CARE PROVIDER: [facility]  RESPONSIBLE PARTY:  Acct ID - Guarantor Home Phone Work Phone Relationship Acct Type  [id number] [name]* [phone]  Self P/F     [place] [address], [place], [place] [address]   Due to the COVID-19 crisis, this virtual check-in visit was done via telephone from my office and it was initiated and consent given by thispatient.  PLAN OF CARE and INTERVENTIONS:             1. GOALS OF CARE/ ADVANCE CARE PLANNING:  Patient's goal is to remain at home.  She has a DNR and MOST form. 2. SOCIAL/EMOTIONAL/SPIRITUAL ASSESSMENT/ INTERVENTIONS:  SW conducted a virtual check-in visit with patient in her home.  She stated she is now transferred with a hoyer lift.  She received her flu shot today and expressed relief in this.  Her neighbor and her the neighbor's daughter continue to be very supportive.  Patient declined a visit to her home due to her concern for COVID-19.  SW expressed understanding. 3. PATIENT/CAREGIVER EDUCATION/ COPING:  Patient copes by problem-solving. 4. PERSONAL EMERGENCY PLAN:  She has a life alert pendent. 5. COMMUNITY RESOURCES COORDINATION/ HEALTH CARE NAVIGATION:  She has private caregivers two times a day. 6. FINANCIAL/LEGAL CONCERNS/INTERVENTIONS:  None.     SOCIAL HX:  Social History   Tobacco Use  . Smoking status: Never Smoker  . Smokeless tobacco: Never Used  Substance Use Topics  . Alcohol use: No    Alcohol/week: 0.0 standard drinks    CODE STATUS:  DNR ADVANCED DIRECTIVES: N MOST FORM COMPLETE:  Yes HOSPICE EDUCATION PROVIDED:  N PPS:  Patient reports she has a good appetite and feels she has gained weight.  She is w/c bound. Duration of visit and documentation:  30 minutes.      Creola Corn Sritha Chauncey, LCSW

## 2018-11-23 ENCOUNTER — Other Ambulatory Visit: Payer: Self-pay

## 2018-12-19 ENCOUNTER — Other Ambulatory Visit: Payer: Medicare Other | Admitting: *Deleted

## 2018-12-19 ENCOUNTER — Other Ambulatory Visit: Payer: Self-pay

## 2018-12-19 DIAGNOSIS — Z515 Encounter for palliative care: Secondary | ICD-10-CM

## 2018-12-19 NOTE — Progress Notes (Signed)
COMMUNITY PALLIATIVE CARE RN NOTE  PATIENT NAME: Suzanne Kim DOB: 19-Mar-1940 MRN: [id number]  PRIMARY CARE PROVIDER: [facility]  RESPONSIBLE PARTY:  Acct ID - Guarantor Home Phone Work Phone Relationship Acct Type  [id number] Suzanne Kim* [phone]  Self P/F     [place] [address], [place], [place] [address]   Due to the COVID-19 crisis, this virtual check-in visit was done via telephone from my office and it was initiated and consent by this patient and or family.  PLAN OF CARE and INTERVENTION:  1. ADVANCE CARE PLANNING/GOALS OF CARE: Goal is for patient to remain at home and avoid hospitalizations. She has a DNR. 2. PATIENT/CAREGIVER EDUCATION: Symptom Management 3. DISEASE STATUS: Virtual check-in visit completed via telephone. Patient is very pleasant and conversational. She remains alert and oriented x 3 and able to engage in appropriate conversation. She denies pain at this time. She denies dyspnea. She continues with hired caregivers 3 hours per day in the am 7 days/week. They also come 4 nights per week to assist with getting her ready for bed. She has a private caregiver that comes the other 3 nights per week. She continues to be transferred via [facility]. She is unable to sit up in her wheelchair d/t her severe kyphosis. This makes her unable to sit in front of her computer as she used to in the past. She states that she is going to buy an Ipad so she can surf the internet and keep in better contact with family and friends. She requires assistance with all ADLs, except feeding. She is intermittently incontinent of both bowel and bladder and wears adult briefs. She has no skin breakdown. Barrier cream being applied daily. She reports that about 3 weeks ago she woke up with a fever of 100. She contacted her PCP, who sent a nurse out the same day. She was placed on a 5-day course of antibiotics and is now afebrile. She feels overall that things are stable. She continues to  request visits be made via telephone during the time d/t pandemic. Will continue to monitor.  HISTORY OF PRESENT ILLNESS:  This is a 78 yo female who resides at home. She has a very supportive neighbor. Palliative care team continues to follow patient. Will continue to visit monthly and PRN.  CODE STATUS: DNR  ADVANCED DIRECTIVES: Y (HPOA, Living Will) MOST FORM: yes PPS: 30%   (Duration of visit and documentation 30 minutes)   Daryl Eastern, RN BSN

## 2019-02-11 ENCOUNTER — Telehealth: Payer: Self-pay | Admitting: Licensed Clinical Social Worker

## 2019-02-11 NOTE — Telephone Encounter (Signed)
Palliative Care SW phoned patient and attempted to leave a vm.  There was no answer.

## 2019-02-13 ENCOUNTER — Telehealth: Payer: Self-pay | Admitting: Licensed Clinical Social Worker

## 2019-02-13 NOTE — Telephone Encounter (Signed)
Palliative Care SW phoned patient again to schedule a visit.  Could not leave a message.

## 2019-02-15 ENCOUNTER — Telehealth: Payer: Self-pay | Admitting: Licensed Clinical Social Worker

## 2019-02-15 NOTE — Telephone Encounter (Signed)
Palliative Care SW phoned patient to assess comfort and needs.  Could not leave a vm.

## 2019-02-20 ENCOUNTER — Other Ambulatory Visit: Payer: Self-pay

## 2019-02-20 ENCOUNTER — Other Ambulatory Visit: Payer: Medicare Other | Admitting: *Deleted

## 2019-02-20 DIAGNOSIS — Z515 Encounter for palliative care: Secondary | ICD-10-CM

## 2019-02-20 NOTE — Progress Notes (Signed)
COMMUNITY PALLIATIVE CARE RN NOTE  PATIENT NAME: JENNALYN CAWLEY DOB: [birth year] MRN: [id number]  PRIMARY CARE PROVIDER: [facility]  RESPONSIBLE PARTY:  Acct ID - Guarantor Home Phone Work Phone Relationship Acct Type  [id number] [name] [phone]  Self P/F     [name], [place], [place] [address]   Due to the COVID-19 crisis, this virtual check-in visit was done via telephone from my office and it was initiated and consent by this patient and or family.  PLAN OF CARE and INTERVENTION:  1. ADVANCE CARE PLANNING/GOALS OF CARE: Goal is for patient to remain in her home. She has a DNR. 2. PATIENT/CAREGIVER EDUCATION: N/A 3. DISEASE STATUS: Virtual check-in visit completed via telephone. Patient continues to prefer telephone visits during Covid-19 pandemic. She is pleased at the fact that she has remained Covid free thus far by limiting visitors. She denies pain at this time. She is alert and oriented x 3. She denies any coughing or shortness of breath. She is non-ambulatory and transferred via [facility]. She requires 1 person assistance with bathing, dressing and toileting. She is intermittently incontinent of both bowel and bladder and wears adult briefs. Barrier cream is placed on patient's bottom after each incontinent episode. She denies any skin breakdown or redness. She is able to feed herself independently. She continues with hired caregivers to assist patient several hours per day in the am and pm, 7 days/week. She reports that her intake is good. She denies dysphagia. She remains able to take all of her medications without difficulty. She says that a nurse from [facility] came yesterday to draw routine labs. No new issues or concerns. Will continue to monitor.  HISTORY OF PRESENT ILLNESS: This is a 79 yo female who resides at home with assistance from hired caregivers several hours per day. Palliative care continues to follow patient for additional  support. Will continue to visit patient monthly and PRN.   CODE STATUS: DNR ADVANCED DIRECTIVES: Y MOST FORM: yes PPS: 30%   (Duration of visit and documentation 30 minutes)   [name], RN BSN

## 2019-03-25 ENCOUNTER — Other Ambulatory Visit: Payer: Medicare Other | Admitting: *Deleted

## 2019-03-25 ENCOUNTER — Other Ambulatory Visit: Payer: Self-pay

## 2019-03-25 DIAGNOSIS — Z515 Encounter for palliative care: Secondary | ICD-10-CM

## 2019-03-27 NOTE — Progress Notes (Signed)
COMMUNITY PALLIATIVE CARE RN NOTE  PATIENT NAME: Suzanne Kim DOB: 29-Mar-1940 MRN: [id number]  PRIMARY CARE PROVIDER: [facility]  RESPONSIBLE PARTY:  Acct ID - Guarantor Home Phone Work Phone Relationship Acct Type  [id number] [name] [phone]  Self P/F     [name], [place], [place] [address]   Due to the COVID-19 crisis, this virtual check-in visit was done via telephone from my office and it was initiated and consent by this patient and or family.  PLAN OF CARE and INTERVENTION:  1. ADVANCE CARE PLANNING/GOALS OF CARE:Goal is for patient to remain in her home. She has a DNR. 2. PATIENT/CAREGIVER EDUCATION: Safe Transfers, symptom management 3. DISEASE STATUS: Virtual check-in visit completed via telephone. Patient denies pain at this time. She does experience occasional back pain and will take Tylenol if necessary. She remains alert and oriented x 3 and able to engage in appropriate conversation. She denies shortness of breath. She continues with hired caregivers daily to assist with her personal care. She requires maximum assistance for bathing, dressing and toileting. She is transferred via [facility] lift. She is intermittently incontinent of both bowel and bladder and wears adult briefs. She is able to feed herself independently. She reports that her appetite is normal and is on a regular diet with thin liquids. She denies dysphagia. She has no skin issues. Barrier cream being applied daily after each incontinent episode. She is going to reach out to her PCP regarding receiving the Covid-19 vaccine, as she is unable to travel outside of the home with car transport. No complaints or concerns at this time. She continues to request telephone visits to minimize visits d/t pandemic. Will continue to monitor.  HISTORY OF PRESENT ILLNESS:  This is a 79 yo female who resides at home alone. She does have hired caregivers and her neighbor visits daily. Palliative care  continues to follow patient and will visit monthly and PRN.  CODE STATUS: DNR  ADVANCED DIRECTIVES: Y MOST FORM: yes PPS: 30%   (Duration of visit and documentation 30 minutes)   [name], RN BSN

## 2019-05-13 ENCOUNTER — Other Ambulatory Visit: Payer: Self-pay

## 2019-05-13 ENCOUNTER — Other Ambulatory Visit: Payer: Medicare Other

## 2019-05-13 DIAGNOSIS — Z515 Encounter for palliative care: Secondary | ICD-10-CM

## 2019-05-13 NOTE — Progress Notes (Signed)
COMMUNITY PALLIATIVE CARE SW NOTE  PATIENT NAME: Suzanne Kim DOB: Sep 05, 1940 MRN: [id number]  PRIMARY CARE PROVIDER: [facility]  RESPONSIBLE PARTY:  Acct ID - Guarantor Home Phone Work Phone Relationship Acct Type  [id number] [name] [phone]  Self P/F     [address], [place], [place] [address]     PLAN OF CARE and INTERVENTIONS:             1. GOALS OF CARE/ ADVANCE CARE PLANNING: Patient is a DNR, form in the home 2. SOCIAL/EMOTIONAL/SPIRITUAL ASSESSMENT/ INTERVENTIONS: SW completed Tele Health visit with patient, per her request due to COVID concerns. SW introduced herself to patient as new palliative care SW. She reported that she is having intermittent pain due to a suspected UTI. Patient has contacted her doctor and a urine specimen has been ordered. Patient report that she is confined to her wheelchair. She has hired caregivers for three hours a day. Her neighbor brings her dinner and assist with getting her bed at night. Patient also reported swelling in her legs and feet this past week, but has shown improvement as the swelling is down today. She verbalized no coping or care issues at this time.  3. PATIENT/CAREGIVER EDUCATION/ COPING: Patient is alert and oriented x3. She is able to verbalize her needs. She was receptive to SW and was openly engaged about her current status. SW reinforced and reassured patient of the support of the palliative care team.  4. PERSONAL EMERGENCY PLAN: Patient can access 911 for any emergencies.  5. COMMUNITY RESOURCES COORDINATION/ HEALTH CARE NAVIGATION: Patient has a supportive network that include privately paid caregivers and her neighbors. Patient suspect that she has a UTI, contacted her physician and a urinalysis was ordered.Patient is awaiting for specimen to be picked up and will contact her physician for follow-up.   6. FINANCIAL/LEGAL CONCERNS/INTERVENTIONS: No financial or legal concerns noted.      SOCIAL HX:   Social History   Tobacco Use  . Smoking status: Never Smoker  . Smokeless tobacco: Never Used  Substance Use Topics  . Alcohol use: No    Alcohol/week: 0.0 standard drinks    CODE STATUS:   Code Status: Prior DNR ADVANCED DIRECTIVES: N MOST FORM COMPLETE:  No HOSPICE EDUCATION PROVIDED: No  PPS: Patient is wheelchair bound. She requires assistance with all ADL's. Patient is alert and oriented x3 and can verbalize her needs.  SW spent 30 minutes with patient via Tele Health visit per patient request due to COVID concerns, providing introductions, supportive presence and counseling, active listening, and reassurance of support.       [address] [place], [place]

## 2019-05-30 ENCOUNTER — Other Ambulatory Visit: Payer: Medicare Other | Admitting: *Deleted

## 2019-05-30 ENCOUNTER — Other Ambulatory Visit: Payer: Self-pay

## 2019-05-30 DIAGNOSIS — Z515 Encounter for palliative care: Secondary | ICD-10-CM

## 2019-06-05 NOTE — Progress Notes (Signed)
COMMUNITY PALLIATIVE CARE RN NOTE  PATIENT NAME: Suzanne Kim DOB: Mar 15, 1940 MRN: [id number]  PRIMARY CARE PROVIDER: [facility]  RESPONSIBLE PARTY:  Acct ID - Guarantor Home Phone Work Phone Relationship Acct Type  [id number] Suzanne Kim* [phone]  Self P/F     [address], [place], [place] [address]   Due to the COVID-19 crisis, this virtual check-in visit was done via telephone from my office and it was initiated and consent by this patient and or family.  PLAN OF CARE and INTERVENTION:  1. ADVANCE CARE PLANNING/GOALS OF CARE: Goal is for patient to remain at home and avoid hospitalizations. She has a DNR. 2. PATIENT/CAREGIVER EDUCATION: Symptom management, s/s of infection 3. DISEASE STATUS: Virtual check-in visit completed via telephone. She remains alert and oriented x 3. She is pleasant and engaging. She denies pain at this time. She does experience occasional lower back pain, and Tylenol is effective when needed. She denies any issues with breathing. No current issues with bilateral lower extremity swelling. She continues with private caregivers 3 hours per day. Her neighbor continues to bring her dinner in the evenings and help her get ready for bed. She is transferred via [facility] lift. She is non-ambulatory and transported via wheelchair. She requires assistance with bathing, dressing and toileting. She remains able to feed herself independently. She is intermittently incontinent of both bowel and bladder. She had a recent UTI and was treated with a 3-day course of antibiotics and she feels that the infection has cleared. She is no longer experiencing dysuria or pain in her pelvic region. Her appetite is good. She denies dysphagia. She spends her day sitting up in her recliner and also sleeps there due to the curvature in her back. She denies any skin issues at this time. Barrier cream is being applied daily. She continues to request that visits be made via telephone  due to the Covid-19 pandemic. She is still limiting visitors. She has received both Covid-19 vaccinations in her home without side effects. Will continue to monitor.  HISTORY OF PRESENT ILLNESS: This is a 79 yo female who resides at home alone. She does have help and support. Palliative care team continues to follow patient and visits monthly and PRN.  CODE STATUS: DNR  ADVANCED DIRECTIVES: Y MOST FORM: yes PPS: 30%   (Duration of visit and documentation 30 minutes)   Suzanne Norse, RN BSN

## 2019-07-09 ENCOUNTER — Other Ambulatory Visit: Payer: Medicare Other | Admitting: *Deleted

## 2019-07-09 ENCOUNTER — Other Ambulatory Visit: Payer: Self-pay

## 2019-07-09 DIAGNOSIS — Z515 Encounter for palliative care: Secondary | ICD-10-CM

## 2019-07-12 NOTE — Progress Notes (Signed)
COMMUNITY PALLIATIVE CARE RN NOTE  PATIENT NAME: Suzanne Kim DOB: [birth year] MRN: [id number]  PRIMARY CARE PROVIDER: [facility]  RESPONSIBLE PARTY:  Acct ID - Guarantor Home Phone Work Phone Relationship Acct Type  [id number] [name] [phone]  Self P/F     [name], [place], [place] [address]   Due to the COVID-19 crisis, this virtual check-in visit was done via telephone from my office and it was initiated and consent by this patient and or family.  PLAN OF CARE and INTERVENTION:  1. ADVANCE CARE PLANNING/GOALS OF CARE: Goal is for patient to remain in her home. She has a DNR. 2. PATIENT/CAREGIVER EDUCATION: Symptom management, safe transfers 3. DISEASE STATUS: Virtual check-in visit completed via telephone. Patient remains alert and oriented x 3. Her mood sounds pleasant and she is very engaging. She denies pain at this time. She does experience occasional back pain and Tylenol is effective when needed. She received her Covid-19 vaccination without any side effects. She received the single dose vaccine about a week ago. She is now agreeable to in-person visits again. She remains total care for ADLs, but is able to feed herself. She is transferred via [facility] lift. She is intermittently incontinent of both bowel and bladder. They continue to apply barrier cream after each incontinent episode. She wears adult briefs. Her intake is good. No dysphagia. No issues with breathing. She does report that she had a reddened area noted to the outer aspect of her right leg that turned into a blister. When that blister healed, another one appeared. This blister is starting to heal. She did reach out to [facility] who prescribed a steroid cream. She was disturbed, however that when the nurse came out to visit, she felt that the patient does not need to be left alone for any length of time and called the Department of Social Services. A case manager from this  department came out to assess the situation and she did not feel any red flags or reason for future visits so the case was closed immediately. Patient has hired caregivers for 3 hours during the day. She then has someone to come over to prepare her dinner, then her neighbor's daughter comes to get her ready for bed each night. Her next door neighbor is her primary contact and comes over whenever needed. Will continue to monitor.  HISTORY OF PRESENT ILLNESS: This is a 79 yo female with a diagnsis of Multiple Sclerosis. Palliative care team continues to follow patient. Will continue to visit monthly and PRN.    CODE STATUS: DNR ADVANCED DIRECTIVES: Y MOST FORM: yes PPS: 30%   (Duration of visit and documentation 30 minutes)   [name], RN BSN

## 2019-08-08 ENCOUNTER — Other Ambulatory Visit: Payer: Self-pay

## 2019-08-08 ENCOUNTER — Other Ambulatory Visit: Payer: Medicare Other | Admitting: *Deleted

## 2019-08-08 DIAGNOSIS — Z515 Encounter for palliative care: Secondary | ICD-10-CM

## 2019-08-19 NOTE — Progress Notes (Signed)
COMMUNITY PALLIATIVE CARE RN NOTE  PATIENT NAME: Suzanne Kim DOB: 1940/09/20 MRN: [id number]  PRIMARY CARE PROVIDER: [facility]  RESPONSIBLE PARTY:  Acct ID - Guarantor Home Phone Work Phone Relationship Acct Type  [id number] Suzanne Kim* [phone]  Self P/F     [address], [place], [place] [address]   Due to the COVID-19 crisis, this virtual check-in visit was done via telephone from my office and it was initiated and consent by this patient and or family.  PLAN OF CARE and INTERVENTION:  1. ADVANCE CARE PLANNING/GOALS OF CARE: Goal is for patient to remain in her home. She has a DNR. 2. PATIENT/CAREGIVER EDUCATION: Symptom management, safe transfers, s/s of infection 3. DISEASE STATUS: Virtual check-in visit completed via telephone. Patient remains alert and oriented x 3, and is pleasant and engaging. She denies pain at this time. She does experience occasional pain in her lower back and Tylenol is effective when needed. She denies any issues with breathing. Denies cough or congestion. She did receive her Covid-19 vaccine (Johnson[employer]Johnson), without any side effects. She remains dependent with bathing, dressing, toileting and transfers. She is non-ambulatory and transferred via [facility] lift. She is incontinent of both bowel and bladder. Zinc oxide is applied after each incontinent episode. She continues with hired caregivers that check on her 3 times/day in 2-3 hr intervals. Her intake remains good. Denies dysphagia. The blisters that appeared on her legs have resolved, however she reports 3 new flat reddened areas to her left arm. She had one reddened, flat spot on her right arm, but is fading away. They are itchy at times. Unsure of cause. She says they pop up and then fades away. Will continue to monitor.   HISTORY OF PRESENT ILLNESS: This is a 79 yo female with a diagnsis of Multiple Sclerosis. Palliative care team continues to follow patient. Will continue to visit  monthly and PRN.     CODE STATUS: DNR ADVANCED DIRECTIVES: Y MOST FORM: yes PPS: 30%   (Duration of visit and documentation 30 minutes)   [name], RN BSN

## 2019-08-30 ENCOUNTER — Other Ambulatory Visit: Payer: Medicare Other | Admitting: *Deleted

## 2019-08-30 ENCOUNTER — Other Ambulatory Visit: Payer: Self-pay

## 2019-08-30 DIAGNOSIS — Z515 Encounter for palliative care: Secondary | ICD-10-CM

## 2019-09-20 NOTE — Progress Notes (Signed)
COMMUNITY PALLIATIVE CARE RN NOTE  PATIENT NAME: Suzanne Kim DOB: 12/24/1940 MRN: [id number]  PRIMARY CARE PROVIDER: [facility]  RESPONSIBLE PARTY:  Acct ID - Guarantor Home Phone Work Phone Relationship Acct Type  [id number] [name] [phone]  Self P/F     [name], [place], [place] [address]   Due to the COVID-19 crisis, this virtual check-in visit was done via telephone from my office and it was initiated and consent by this patient and or family.  PLAN OF CARE and INTERVENTION:  1. ADVANCE CARE PLANNING/GOALS OF CARE: Goal is for patient to remain in her home as long as possible. 2. PATIENT/CAREGIVER EDUCATION: Symptom management, safe transfers 3. DISEASE STATUS: Virtual check-in visit completed via telephone. Patient denies pain at this time. She does have occasional back pain that she treats with Tylenol as needed. She denies any issues with breathing. She remains alert and oriented x 3. Pleasant mood and engaging. She is total care for all ADLs, except feeding. She is transferred via [facility] lift. She sleeps in her recliner at all times d/t her severe back curvature. Her intake remains good. She denies dysphagia. She feels she has adequate hydration and takes her medications without difficulties. She is incontinent of both bowel and bladder and wears adult briefs. She continues with caregivers that assist patient 3x/day for a few hours to assist with personal care, light housework and meal preparation. Her neighbor lives next door and is very supportive. She has been having ongoing recent issues with small raised red areas appearing on her legs, then will begin to fade away after a few days. They do itch at times. Unsure of the cause. There are not painful. She is sleeping well during the night. Will continue to monitor.  HISTORY OF PRESENT ILLNESS: This is a 79 yo female with a diagnsis of Multiple Sclerosis. Palliative care will continue to follow patient.     CODE STATUS: DNR ADVANCED DIRECTIVES: Y MOST FORM: yes PPS: 30%   (Duration of visit and documentation 30 minutes)   [name], RN BSN

## 2019-11-25 IMAGING — DX DG CHEST 1V PORT
1 series · 1 of 1 positions shown · non-contrast
Comparison: Portable exam 7794 hours compared to 01/22/2018

CLINICAL DATA: Pleural effusion, follow-up, history multiple
sclerosis

EXAM:
PORTABLE CHEST 1 VIEW

[chest ap]
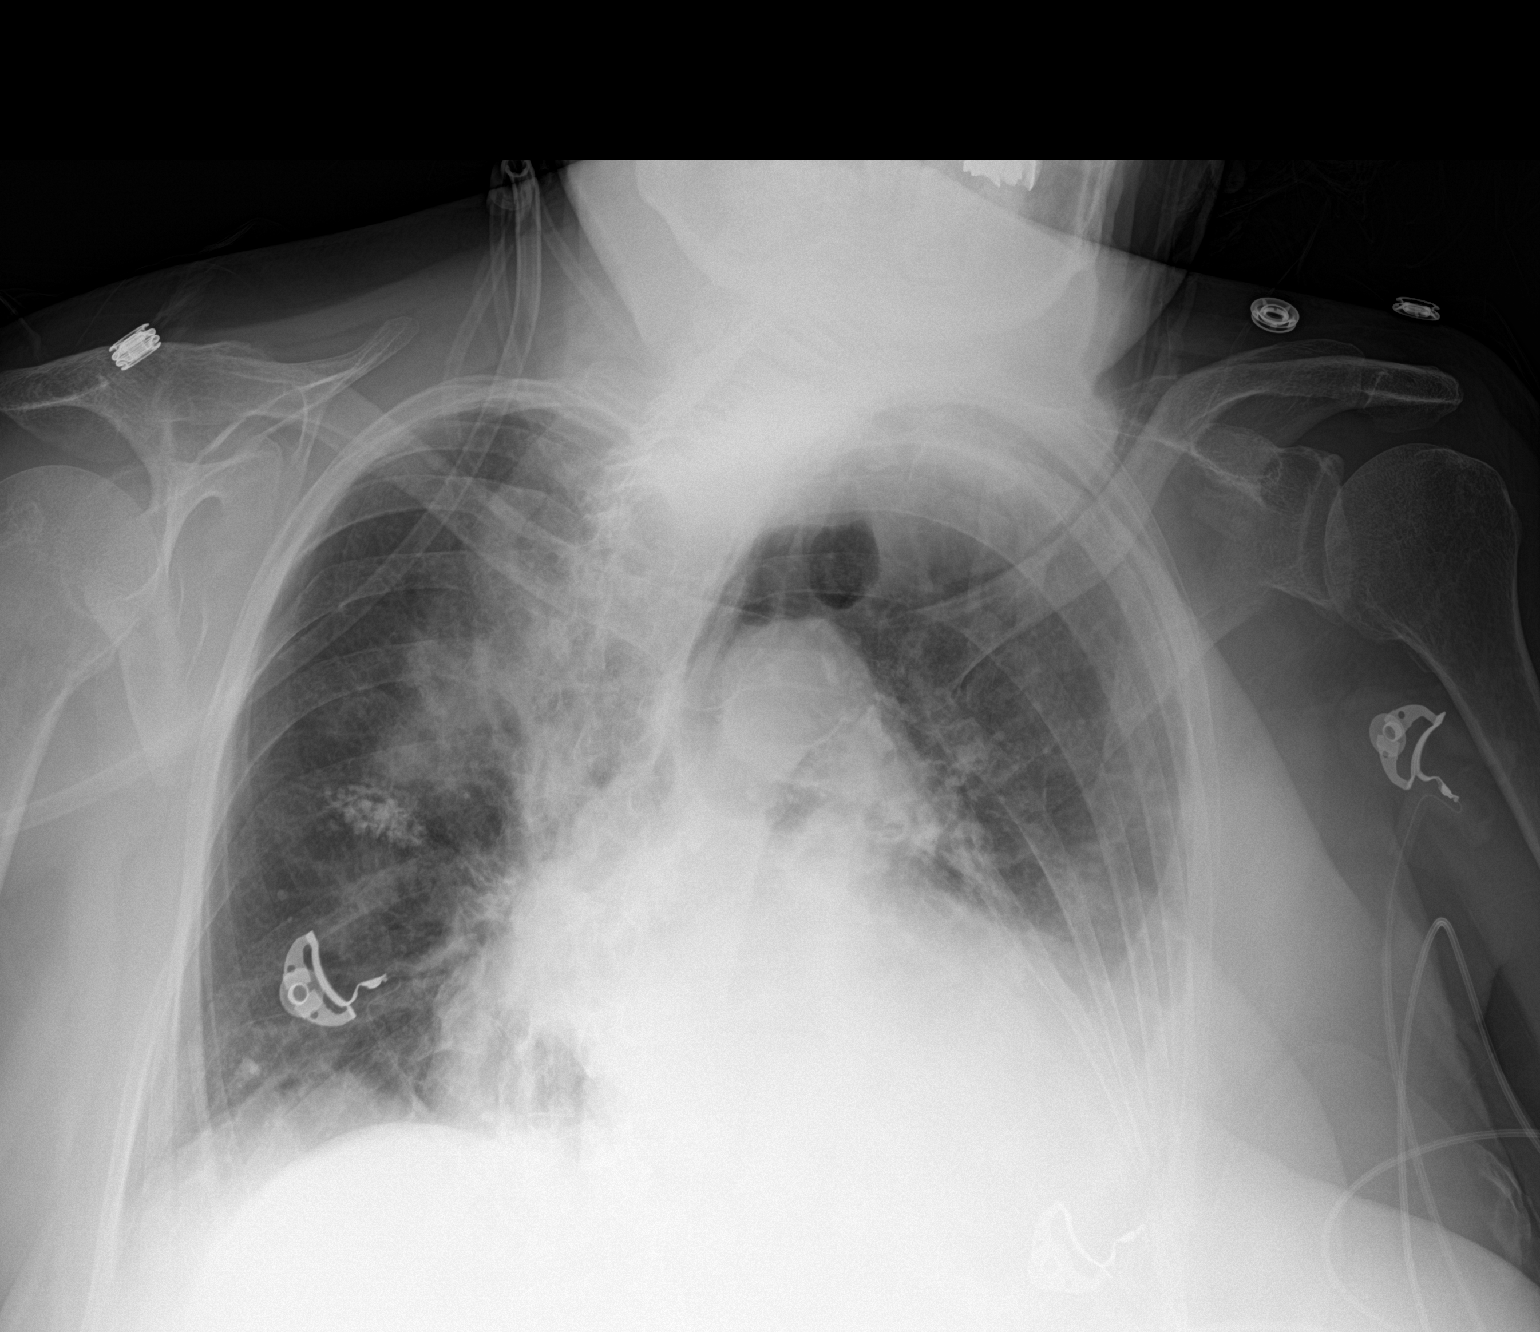

[1 of 1 positions shown; findings below may reference images not displayed]

FINDINGS: Rotated to the LEFT.

Enlargement of cardiac silhouette.

Atherosclerotic calcification aorta.

New opacities in the RIGHT upper lobe and LEFT lower lobe question
pneumonia.

Calcific density in the mid RIGHT lung unchanged.

Probable small LEFT pleural effusion.

No pneumothorax.

Bones demineralized.
IMPRESSION: Enlargement of cardiac silhouette.

Probable infiltrates in RIGHT upper and LEFT lower lobes with
associated LEFT pleural effusion.

## 2020-02-19 ENCOUNTER — Other Ambulatory Visit: Payer: Medicare Other | Admitting: Nurse Practitioner

## 2020-02-19 ENCOUNTER — Other Ambulatory Visit: Payer: Self-pay

## 2020-02-19 DIAGNOSIS — Z515 Encounter for palliative care: Secondary | ICD-10-CM

## 2020-02-19 DIAGNOSIS — G35 Multiple sclerosis: Secondary | ICD-10-CM

## 2020-02-19 NOTE — Progress Notes (Signed)
[facility] Consult Note Telephone: [phone]  Fax: [phone]  PATIENT NAME: Suzanne Kim [place] [address] [phone] (home)  DOB: 1940/01/21 MRN: [id number] [phone] PRIMARY CARE PROVIDER:    [facility],  [place] [address] [phone]  REFERRING PROVIDER:   [facility] [address] [place],  [place] [address] [phone]  RESPONSIBLE PARTY:   Extended Emergency Contact Information Primary Emergency Contact: Domitrovits,[name] Address: [address]          [place], [place] [address] [place] Mobile Phone: [phone] Relation: Friend  I met face to face with patient in home.   ASSESSMENT AND RECOMMENDATIONS:   Advance Care Planning: Today's visit consisted of building trust and discussions on Palliative care medicine as a specialized medical care for people living with serious illness, aimed at facilitating improved quality of life through symptoms relief, assisting with advance care planning and establishing goals of care. Patient expressed appreciation for education provided on Palliative care and how it differs from Hospice service. Palliative care will continue to provide support to patient, family and the medical team. Goal of care: Patient's goal of care is to remain in her home.  Directives: Patient reiterated desire to not be resuscitated in the event of cardiac or respiratory arrest. She has a signed DNR and MOST form in home, copy uploaded to [facility] EMR.  Symptom Management:  Pain/spasm: Patient denied pain. Report occasional spasm in bilateral lower legs. She is not on any medication for pain, on Gabapentin 300mg  at bedtime, patient report only taking 200mg  at bedtime. Patient had Robaxin on her med list on EPic, patient report not having prescription for it. Recommendation:  consider restarting Robaxin 300mg  TID as previously ordered. Continue Gabapentin as prescribed. Safety: Patient endorsed being safe in home, report feeling well. Denied any concerns today. Has medical alert safety device around her neck. Has a home phone by her side. She report good appetite, denied insomnia. No report of falls. Questions and concerns were addressed. Patient was encouraged to call with questions and/or concerns. My business card was provided. Provided general support and encouragement, no other unmet needs identified.  Follow up Palliative Care Visit: Palliative care will continue to follow for goals of care clarification and symptom management. Return in about 4 weeks or prn.  Family /Caregiver/Community Supports: Patient lives alone in her home. She report having no family, has paid caregivers that check in on her and assist with her ADLs for 2-3 hrs twice a day. Her neighbor Di Kindle assists her with heating up her meals for dinner daily. [facility] provided by [facility].  Cognitive / Functional decline: Patient awake, alert and coherent. She is totally dependent on persons for all of her ADLs. Patient is non-ambulatory, stay and sleeps in her recliner. No report of falls.   I spent 45 minutes providing this consultation, time includes time spent with patient/family, chart review, provider coordination, and documentation. More than 50% of the time in this consultation was spent counseling and coordinating communication.   CHIEF COMPLAINT: Initial palliative care visit  History obtained from review of EMR and discussion with patient. Records reviewed and summarized bellow.  HISTORY OF PRESENT ILLNESS:  Suzanne Kim is a 80 y.o. year old female with multiple medical problems including Multiple sclerosis, neuromuscular dysfunction, idiopathic  Scoliosis, osteoporosis, iron deficiency present. Palliative Care was asked to help  address advance care planning and  complex decision making.   CODE STATUS: DNR  PPS: 30%  HOSPICE ELIGIBILITY/DIAGNOSIS: TBD  PHYSICAL EXAM / ROS:   General: NAD, well appearing, sitting in recliner in her living room in NAD Cardiovascular: denied chest pain, denied palpitation, no edema  Pulmonary: no cough, no increased SOB, room air GI:  appetite fair, denied constipation, incontinent of bowel GU: denies dysuria, incontinent of urine MSK:  Muscle atrophy noted to extremities, non ambulatory Skin: no rashes or wounds reported or noted on exposed skin Neurological: Weakness, but otherwise nonfocal Psych: non -anxious affect  PAST MEDICAL HISTORY:  Past Medical History:  Diagnosis Date  . Hiatal hernia   . Multiple sclerosis ([place]) 1988    SOCIAL HX:  Social History   Tobacco Use  . Smoking status: Never Smoker  . Smokeless tobacco: Never Used  Substance Use Topics  . Alcohol use: No    Alcohol/week: 0.0 standard drinks   FAMILY HX:  Family History  Problem Relation Age of Onset  . Heart failure Father   . Ataxia Neg Hx   . Chorea Neg Hx   . Dementia Neg Hx   . Mental retardation Neg Hx   . Migraines Neg Hx   . Multiple sclerosis Neg Hx   . Neurofibromatosis Neg Hx   . Neuropathy Neg Hx   . Parkinsonism Neg Hx   . Seizures Neg Hx   . Stroke Neg Hx     ALLERGIES: No Known Allergies   PERTINENT MEDICATIONS:  Outpatient Encounter Medications as of 02/19/2020  Medication Sig  . Acetaminophen (CHLORASEPTIC SORE THROAT PO) Take 1 lozenge by mouth daily as needed (cold symptoms).  Marland Kitchen acetaminophen (TYLENOL) 500 MG tablet Take 500 mg by mouth daily as needed for mild pain, moderate pain, fever or headache.   . benzonatate (TESSALON) 100 MG capsule Take 100 mg by mouth 2 (two) times daily as needed for cough.  . gabapentin (NEURONTIN) 100 MG capsule Take 200 mg by mouth 2 (two) times daily.   Marland Kitchen guaiFENesin (MUCINEX) 600 MG 12 hr tablet Take 600 mg by mouth daily as needed for cough or to loosen phlegm.   Marland Kitchen HYDROcodone-acetaminophen (NORCO/VICODIN) 5-325 MG tablet Take 1 tablet by mouth every 6 (six) hours as needed for moderate pain.  . meclizine (ANTIVERT) 25 MG tablet Take 25 mg by mouth 2 (two) times daily as needed for dizziness.  . methocarbamol (ROBAXIN) 750 MG tablet Take 750 mg by mouth 3 (three) times daily.   . mirtazapine (REMERON) 7.5 MG tablet Take 1 tablet (7.5 mg total) by mouth at bedtime.  . ondansetron (ZOFRAN) 4 MG tablet Take 1 tablet (4 mg total) by mouth every 6 (six) hours as needed for nausea.  . pantoprazole (PROTONIX) 40 MG tablet Take 1 tablet (40 mg total) by mouth daily before breakfast.  . raloxifene (EVISTA) 60 MG tablet Take 60 mg by mouth daily.   Marland Kitchen senna-docusate (SENOKOT-S) 8.6-50 MG tablet Take 2 tablets by mouth at bedtime.  . vitamin E 400 UNIT capsule Take 400 Units by mouth daily.   No facility-administered encounter medications on file as of 02/19/2020.    Thank you for the opportunity to participate in the care of Ms. Cecille Amsterdam. The palliative care team will continue to follow. Please call our office at [phone] if we can be of additional assistance.  Jari Favre, DNP, AGPCNP-BC

## 2020-03-24 ENCOUNTER — Other Ambulatory Visit: Payer: Medicare Other | Admitting: Nurse Practitioner

## 2020-03-24 ENCOUNTER — Other Ambulatory Visit: Payer: Self-pay

## 2020-03-24 DIAGNOSIS — G35 Multiple sclerosis: Secondary | ICD-10-CM

## 2020-03-24 DIAGNOSIS — Z515 Encounter for palliative care: Secondary | ICD-10-CM

## 2020-03-24 NOTE — Progress Notes (Signed)
[facility] Consult Note Telephone: [phone]  Fax: [phone]  PATIENT NAME: Suzanne Kim [place] [address] [phone] (home)  DOB: 07-10-1940 MRN: [id number]  PRIMARY CARE PROVIDER:    [facility],  [place] [address] [phone]  REFERRING PROVIDER:   [facility] [address] [place],  [place] [address] [phone]  RESPONSIBLE PARTY:   Extended Emergency Contact Information Primary Emergency Contact: Domitrovits,[name] Address: [address]          [place], [place] [address] [place] Mobile Phone: [phone] Relation: Friend  I met face to face with patient in home. Patient alone in home at the time of visit.   ASSESSMENT AND RECOMMENDATIONS:   Advance Care Planning: Patient's goal of care is comfort and to remain in her home. Signed DNR and MOST form in home, copy on [facility] EMR.   Cognitive / Functional decline/ Symptom Management: Patient awake, alert and coherent, alert and oriented x 4, She is totally dependent on persons for all of her ADLs, able to feed self. Patient is non-ambulatory, stays and sleeps in her recliner. No report of falls. Patient voiced no concerns today, denied fever chills, or shortness of breath. Report sleeping well and great appetite. Denied any uncontrolled pain. Report having routine Vaginal pessary check yesterday by her Obgyn. No new issues reported. Discussed need for home cleaning assistance and also cleaning out her home, saying there may be religious volunteer groups that could help. Patient declines services, saying she would think about it and let me know when she needs one. Questions and concerns were addressed. Patient was encouraged to call with questions and/or concerns. Provided general support and encouragement, no other unmet needs identified at this  time.  Follow up Palliative Care Visit: Palliative care will continue to follow for complex decision making and symptom management. Return in about 4 weeks or prn.  Family /Caregiver/Community Supports: Patient lives alone in her home. Has paid caregivers that ssist with her ADLs for 2-3 hrs twice a day. Her neighbor Di Kindle assists her with heating up her meals for dinner daily.   I spent 30 minutes providing this consultation. More than 50% of the time in this consultation was spent counseling and coordinating communication.   CHIEF COMPLAINT: Follow up palliative care visit  History obtained from review of EMR and discussion with patient. Records reviewed and summarized bellow.  HISTORY OF PRESENT ILLNESS:  Suzanne Kim is a 80 y.o. year old female with multiple medical problems including Multiple sclerosis, neuromuscular dysfunction, idiopathic Scoliosis, osteoporosis, iron deficiency present. Palliative Care was asked to help address advance care planning and complex decision making.   CODE STATUS: DNR  PPS: 30%  HOSPICE ELIGIBILITY/DIAGNOSIS: TBD  PHYSICAL EXAM / ROS:   General: NAD, well appearing, sitting in recliner in her living room in NAD Cardiovascular: denied chest pain, denied palpitation, no edema  Pulmonary: no cough, no increased SOB, room air GI:  appetite good, denied constipation, incontinent of bowel GU: denies dysuria, incontinent of urine MSK:  Muscle atrophy noted to extremities, non ambulatory Skin: no rashes or wounds reported or noted on exposed skin Neurological: Weakness, but otherwise nonfocal Psych: non -anxious affect   PAST MEDICAL HISTORY:  Past Medical History:  Diagnosis Date  . Hiatal hernia   . Multiple sclerosis ([place]) 1988    SOCIAL HX:  Social History   Tobacco  Use  . Smoking status: Never Smoker  . Smokeless tobacco: Never Used  Substance Use Topics  . Alcohol use: No    Alcohol/week: 0.0 standard drinks   FAMILY HX:   Family History  Problem Relation Age of Onset  . Heart failure Father   . Ataxia Neg Hx   . Chorea Neg Hx   . Dementia Neg Hx   . Mental retardation Neg Hx   . Migraines Neg Hx   . Multiple sclerosis Neg Hx   . Neurofibromatosis Neg Hx   . Neuropathy Neg Hx   . Parkinsonism Neg Hx   . Seizures Neg Hx   . Stroke Neg Hx     ALLERGIES: No Known Allergies   PERTINENT MEDICATIONS:  Outpatient Encounter Medications as of 03/24/2020  Medication Sig  . Acetaminophen (CHLORASEPTIC SORE THROAT PO) Take 1 lozenge by mouth daily as needed (cold symptoms).  Marland Kitchen acetaminophen (TYLENOL) 500 MG tablet Take 500 mg by mouth daily as needed for mild pain, moderate pain, fever or headache.   . benzonatate (TESSALON) 100 MG capsule Take 100 mg by mouth 2 (two) times daily as needed for cough.  . gabapentin (NEURONTIN) 100 MG capsule Take 200 mg by mouth 2 (two) times daily.   Marland Kitchen guaiFENesin (MUCINEX) 600 MG 12 hr tablet Take 600 mg by mouth daily as needed for cough or to loosen phlegm.  Marland Kitchen HYDROcodone-acetaminophen (NORCO/VICODIN) 5-325 MG tablet Take 1 tablet by mouth every 6 (six) hours as needed for moderate pain.  . meclizine (ANTIVERT) 25 MG tablet Take 25 mg by mouth 2 (two) times daily as needed for dizziness.  . methocarbamol (ROBAXIN) 750 MG tablet Take 750 mg by mouth 3 (three) times daily.   . mirtazapine (REMERON) 7.5 MG tablet Take 1 tablet (7.5 mg total) by mouth at bedtime.  . ondansetron (ZOFRAN) 4 MG tablet Take 1 tablet (4 mg total) by mouth every 6 (six) hours as needed for nausea.  . pantoprazole (PROTONIX) 40 MG tablet Take 1 tablet (40 mg total) by mouth daily before breakfast.  . raloxifene (EVISTA) 60 MG tablet Take 60 mg by mouth daily.   Marland Kitchen senna-docusate (SENOKOT-S) 8.6-50 MG tablet Take 2 tablets by mouth at bedtime.  . vitamin E 400 UNIT capsule Take 400 Units by mouth daily.   No facility-administered encounter medications on file as of 03/24/2020.    Thank you for the  opportunity to participate in the care of Ms. Ilean Skill. The palliative care team will continue to follow. Please call our office at [phone] if we can be of additional assistance.  Jari Favre, DNP, AGPCNP-BC

## 2020-06-12 ENCOUNTER — Other Ambulatory Visit: Payer: Self-pay

## 2020-06-12 ENCOUNTER — Other Ambulatory Visit: Payer: Medicare Other | Admitting: Nurse Practitioner

## 2020-06-12 VITALS — BP 138/76 | HR 72 | Resp 16

## 2020-06-12 DIAGNOSIS — G35 Multiple sclerosis: Secondary | ICD-10-CM

## 2020-06-12 DIAGNOSIS — Z515 Encounter for palliative care: Secondary | ICD-10-CM

## 2020-06-12 DIAGNOSIS — M1990 Unspecified osteoarthritis, unspecified site: Secondary | ICD-10-CM

## 2020-06-12 NOTE — Progress Notes (Signed)
[facility] Consult Note Telephone: [phone]  Fax: [phone]    Date of encounter: 06/12/20 PATIENT NAME: Suzanne Kim [address] [place] [address]   [phone] (home)  DOB: [birth year] MRN: [id number]  PRIMARY CARE PROVIDER:    [facility],  [place] [address] [phone]  REFERRING PROVIDER:   [facility] [address] [place],  [place] [address] [phone]  RESPONSIBLE PARTY:    Contact Information    Name Relation Home Work [place]   [phone]     I met face to face with patient in home, her care giver Suzanne Kim present during visit.  Palliative Care was asked to follow this patient by consultation request of  [facility]* to address advance care planning and complex medical decision making. This is a follow up visit.                                  ASSESSMENT AND PLAN / RECOMMENDATIONS:   Advance Care Planning/Goals of Care:  Code Status: DNR Goal of Care: Goal of care is comfort and to remain in her home through end of life. Directives: Signed DNR and MOST forms present in home. Details of MOST form include comfort measures, determine use or limitation of antibiotics when infection occurs, no IV fluids, no feeding tube.  Symptom Management/Plan: Incomplete uterovaginal prolapse: condition is chronic but stable. Patient denied pain at the site. Patient deemed not a good surgical candidate. Continue supportive care. Provide prompt and  thorough peri care as needed.  Osteoporosis: Condition is stable without report of pathological fracture. Continue Raloxifene 60mg  daily. Continue supportive care, maintain safety, prevent falls. Arthritis: Denied acute change in function, denied any uncontrolled pain.  Patient still able to feed self. Continue supportive care. Tylenol 500mg  daily as needed  for pain. Multiple sclerosis: Stable, denied spasms. Continue current plan of care with Methocarbamol 750mg  three times daily and Gabapentin 200g twice a day. Questions and concerns were addressed. Patient encouraged to call with questions and/or concerns. Provided general support and encouragement, no other unmet needs identified at this time.  Follow up Palliative Care Visit: Palliative care will continue to follow for complex medical decision making, advance care planning, and clarification of goals. Return in about 4-6 weeks or prn.  PPS: 30%  HOSPICE ELIGIBILITY/DIAGNOSIS: TBD  CHIEF COMPLAIN: follow up palliative care vist  History obtained from review of Epic EMR and discussion with Suzanne Kim and her caregiver.  HISTORY OF PRESENT ILLNESS:Suzanne C Gallowayis a 80 y.o.year old femalewith multiple medical problems including Multiple sclerosis,neuromuscular dysfunction, idiopathicScoliosis, osteoporosis, incomplete uterovaginal prolapse, arthritis, vaginal pessary present. Patient seen today for routine Palliative care follow up visit. Patient awake, alert, and coherent. She lives alone and dependent on her caregivers for all her ADLs but able to feed self. She is non-mobile, spends her days in her recliner in her living room where she also sleeps. She voiced no concerns today. Report feeling well, has not been treated for any acute illness since last palliative care visit 2 months ago. She received her primary care services from [facility] calls. She denied fever, denied chills, denied SOB. Ten systems reviewed and are negative for acute change, except as noted in the HPI.   Reviewed labs from 03/18/2020 WBC 6.74, Platelet 125, Hgb 11.9, Hct 38.3,  Na 147, K 4.1, Cr 0.84, GFR 70, A1c 5.3  I reviewed available labs, medications, imaging, studies and related documents from the Epic EMR.  Records reviewed and summarized above.   Vitals:   06/12/20 1112  BP: 138/76  Pulse:  72  Resp: 16  SpO2: 98%   Physical Exam: Current and past weights: No current recent weight reported, patient denied weight loss General: well appearing, cooperative, sitting in her recliner in NAD EYES: anicteric sclera, no discharge  ENMT: intact hearing, oral mucous membranes moist CV: RRR, no LE edema Pulmonary: LCTA, no increased work of breathing, no cough, room air Abdomen: no ascites,  GU: deferred MSK: sarcopenia, non ambulatory, bilateral foot drop, bilateral hand deformity from arthritis Skin: warm and dry, no rashes or wounds on visible skin Neuro:  no generalized weakness,  no cognitive impairment Psych: non-anxious affect, A and O x 3 Hem/lymph/immuno: no widespread bruising  Past Medical History:  Diagnosis Date  . Hiatal hernia   . Multiple sclerosis ([place]) 1988   Thank you for the opportunity to participate in the care of Suzanne Kim.  The palliative care team will continue to follow. Please call our office at [phone] if we can be of additional assistance.   Suzanne Favre, DNP, AGPCNP-BC  COVID-19 PATIENT SCREENING TOOL Asked and negative response unless otherwise noted:   Have you had symptoms of covid, tested positive or been in contact with someone with symptoms/positive test in the past 5-10 days?

## 2020-10-21 ENCOUNTER — Other Ambulatory Visit: Payer: Self-pay

## 2020-10-21 ENCOUNTER — Other Ambulatory Visit: Payer: Medicare Other | Admitting: *Deleted

## 2020-10-21 ENCOUNTER — Other Ambulatory Visit: Payer: Medicare Other

## 2020-10-21 DIAGNOSIS — Z515 Encounter for palliative care: Secondary | ICD-10-CM

## 2020-10-21 NOTE — Progress Notes (Signed)
SOCIAL WORK VISIT rescheduled SW/RN visit rescheduled to 10/27/20 @ 1:30pm.

## 2020-10-22 NOTE — Progress Notes (Signed)
PATIENT NAME: Suzanne Kim DOB: 01-Nov-1940 MRN: [id number]  PRIMARY CARE PROVIDER: [facility]  RESPONSIBLE PARTY:  Acct ID - Guarantor Home Phone Work Phone Relationship Acct Type  [id number] [name], [name]* [phone]  Self P/F     [address] [place], [place] [address]   RN/SW visit rescheduled for 10/27/20 @ 1:30p.   [name], RN BSN

## 2020-10-27 ENCOUNTER — Other Ambulatory Visit: Payer: Medicare Other | Admitting: *Deleted

## 2020-10-27 ENCOUNTER — Other Ambulatory Visit: Payer: Self-pay

## 2020-10-27 ENCOUNTER — Other Ambulatory Visit: Payer: Medicare Other

## 2020-10-27 VITALS — BP 169/103 | HR 81 | Temp 97.9°F | Resp 18

## 2020-10-27 DIAGNOSIS — Z515 Encounter for palliative care: Secondary | ICD-10-CM

## 2020-10-28 NOTE — Progress Notes (Signed)
[facility] RN NOTE  PATIENT NAME: Suzanne Kim DOB: 07/04/1940 MRN: [id number]  PRIMARY CARE PROVIDER: [facility]  RESPONSIBLE PARTY:  Acct ID - Guarantor Home Phone Work Phone Relationship Acct Type  [id number] - Police,DI* [phone]  Self P/F     [address], [place], [place] [address]   Covid-19 Pre-screening Negative  PLAN OF CARE and INTERVENTION:  ADVANCE CARE PLANNING/GOALS OF CARE: Goal is for patient to remain in her home. She has a DNR. PATIENT/CAREGIVER EDUCATION: Symptom management, safe transfers, s/s of infection DISEASE STATUS: Joint follow-up visit made with LCSW, M. Lonon. Met with patient in her home. She is sitting up in her recliner where she spends all of her time. She is alert and oriented x 4. Very pleasant and engaging. She reports chronic pain in her back, but is not present at this time. She takes Tylenol on occasion when needed. She denies shortness of breath, coughing or congestion. She is taking a nebulizer treatment once daily she says she was placed on when she was having difficulties breathing in the past. She feels that she may currently have a UTI. She reports dysuria and says that her urine seems to gush out and she has less control. These are her usual symptoms when she has been diagnosed with a UTI in the past. She has an appointment with her gynecologist next week. She is afebrile. She continues with hired caregivers for 3 hours in the am from about 830a to 1130a and for about an hour in the evenings. Her neighbor checks on her daily. She is total care for all ADLs, but is able to feed herself independently. She is transferred via [place] lift. She is incontinent to both bowel and bladder and wears adult briefs. Desitin and vaseline are applied after each incontinent episode or brief change. Her appetite is good. She eats 3 meals/day. Denies dysphagia. She has a contracture noted in her right hand in the 3rd, 4th  and 5th fingers. She has trace edema to bilateral lower extremities, right > left. She says that she does not currently have a PCP anymore. SW provided resources of a list of practices that make home visits. She is appreciative.  HISTORY OF PRESENT ILLNESS:  This is a 80 yo female with a diagnosis of progressive multiple sclerosis. Palliative care team continues to follow patient for additional support, goals of care and complex decision making.  CODE STATUS: DNR ADVANCED DIRECTIVES: Y MOST FORM: yes PPS: 30%   PHYSICAL EXAM:   VITALS: Today's Vitals   10/27/20 1352  BP: (!) 169/103  Pulse: 81  Resp: 18  Temp: 97.9 F (36.6 C)  TempSrc: Temporal  SpO2: 96%  PainSc: 0-No pain    LUNGS: clear to auscultation  CARDIAC: Cor RRR EXTREMITIES: Trace edema to bilateral lower extremities R>L SKIN:  Exposed skin is dry and intact   NEURO:  Alert and oriented x 4, pleasant mood, generalized weakness, chair bound   (Duration of visit and documentation 45 minutes)    Monishia Howard, RN BSN  

## 2020-10-28 NOTE — Progress Notes (Signed)
COMMUNITY PALLIATIVE CARE SW NOTE  PATIENT NAME: Suzanne Kim DOB: 07-21-1940 MRN: [id number]  PRIMARY CARE PROVIDER: [facility]  RESPONSIBLE PARTY:  Acct ID - Guarantor Home Phone Work Phone Relationship Acct Type  [id number] [name] [phone]  Self P/F     [address], [place], [place] [address]     PLAN OF CARE and INTERVENTIONS:             GOALS OF CARE/ ADVANCE CARE PLANNING:  Goal is for patient to remain in her home. Patient is a DNR. SOCIAL/EMOTIONAL/SPIRITUAL ASSESSMENT/ INTERVENTIONS:  SW and RN-M. Howard completed a visit with patient at her home. She was sitting in her recliner, awake and alert when the team arrived. Patient report that she is scheduled to see her OBGYN next week as she suspects she may have a UTI. Patient report having some burning during urination, back pain and slow urine output when she goes to he bathroom. Patient is now receiving a nebulizer treatment 1x/day to help improve his breathing, however she denies any shortness of breath or congestion. She has mild swelling in her legs, but continue to keep them elevated. Patient continues to live alone with daily contact through her neighbors and private caregivers. Patient is looking for a new physian that can visit her at home. SW provided information for remote health and encouraged her to call and discuss the services they have available. No other concerns noted. SW provided supportive presence, assessment of needs and comfort, observation, provided resources, active listening, and reassurance of support. Patient remains open to ongoing palliative care/SW support. PATIENT/CAREGIVER EDUCATION/ COPING:  Patient appears to be coping well. She has supportive private sitters and her neighbor is very helpful. PERSONAL EMERGENCY PLAN:  Patient has a life alert pendant. She is able is access to 911 for emergencies. COMMUNITY RESOURCES COORDINATION/ HEALTH CARE NAVIGATION:  She has private  caregivers 2x/day.  FINANCIAL/LEGAL CONCERNS/INTERVENTIONS:  None.     SOCIAL HX:  Social History   Tobacco Use   Smoking status: Never   Smokeless tobacco: Never  Substance Use Topics   Alcohol use: No    Alcohol/week: 0.0 standard drinks    CODE STATUS: DNR ADVANCED DIRECTIVES: Yes MOST FORM COMPLETE:  Yes HOSPICE EDUCATION PROVIDED: No  PPS: Patient is alert and oriented x 43 She reports that she has a good appetite. She is w/c bound. Patient is able to verbalize her needs and seek resources/support when needed.   Duration of visit and documentation:  60 minutes  Clydia Llano, LCSW

## 2021-02-03 ENCOUNTER — Emergency Department (HOSPITAL_COMMUNITY): Payer: Medicare Other

## 2021-02-03 ENCOUNTER — Encounter (HOSPITAL_COMMUNITY): Payer: Self-pay

## 2021-02-03 ENCOUNTER — Other Ambulatory Visit: Payer: Self-pay

## 2021-02-03 ENCOUNTER — Emergency Department (HOSPITAL_COMMUNITY)
Admission: EM | Admit: 2021-02-03 | Discharge: 2021-02-03 | Disposition: A | Discharge status: Home / Self Care | Payer: Medicare Other | Attending: Emergency Medicine | Admitting: Emergency Medicine

## 2021-02-03 DIAGNOSIS — Z79899 Other long term (current) drug therapy: Secondary | ICD-10-CM | POA: Diagnosis not present

## 2021-02-03 DIAGNOSIS — M25552 Pain in left hip: Secondary | ICD-10-CM | POA: Insufficient documentation

## 2021-02-03 MED ORDER — OXYCODONE HCL 5 MG PO TABS: 5.0000 mg | ORAL_TABLET | ORAL | 0 refills | Status: DC | PRN

## 2021-02-03 MED ORDER — OXYCODONE HCL 5 MG PO TABS
5.0000 mg | ORAL_TABLET | Freq: Once | ORAL | Status: AC
  Administered 2021-02-03: 5 mg via ORAL
  Filled 2021-02-03: qty 1

## 2021-02-03 NOTE — Discharge Instructions (Addendum)
Call and schedule an appointment with orthopedics within the next week for further evaluation of the left hip.  Your x-ray and CT scan was negative for fracture or dislocation. Take prescribed pain medication as needed for severe pain.  Return if development of new or worsening symptoms.

## 2021-02-03 NOTE — ED Provider Notes (Signed)
[facility] DEPT Provider Note   CSN: [id number] Arrival date & time: 02/03/21  1256     History  Chief Complaint  Patient presents with   Hip Pain    Suzanne Kim is a functional quadriplegic 81 y.o. female with a history of multiple sclerosis who arrived by ambulance for increasing left hip pain.  She is accompanied by her long-term caregiver.  Left hip pain has been increasing over the last month.  Pain rated mild to moderate with palpation and moderate to severe with movement.  Pain described as sharp and is worsened with movement.  Patient tried Tylenol and muscle relaxants with no relief.  She states her right hip and upper leg also experiences pain but that the left is much worse.  Patient denies being seen by orthopedics in the last few years or ever having being seen for this hip.  Denies fall, loss of consciousness, dizziness, recent infection or illness, fever, confusion shortness of breath, or vision changes.  She is currently managed the remote health and has not been to see a primary care in person since at least 10/2020.   The history is provided by the patient, medical records and a caregiver.  Hip Pain Pertinent negatives include no chest pain, no abdominal pain and no shortness of breath.      Home Medications Prior to Admission medications   Medication Sig Start Date End Date Taking? Authorizing Provider  albuterol (PROVENTIL) (2.5 MG/3ML) 0.083% nebulizer solution Take 2.5 mg by nebulization daily. 10/13/20  Yes [provider]  ferrous sulfate 325 (65 FE) MG EC tablet Take 325 mg by mouth daily with breakfast.   Yes [provider]  gabapentin (NEURONTIN) 100 MG capsule Take 300 mg by mouth at bedtime.   Yes [provider]  metaxalone (SKELAXIN) 800 MG tablet Take 800 mg by mouth 2 (two) times daily.   Yes [provider]  Multiple Vitamin (MULTIVITAMIN WITH MINERALS) TABS tablet Take 1 tablet  by mouth daily.   Yes [provider]  VITAMIN D PO Take 1 capsule by mouth daily.   Yes [provider]  mirtazapine (REMERON) 7.5 MG tablet Take 1 tablet (7.5 mg total) by mouth at bedtime. Patient not taking: Reported on 02/03/2021 01/25/18   Roxan Hockey, MD      Allergies    Patient has no known allergies.    Review of Systems   Review of Systems  Constitutional:  Positive for activity change. Negative for chills, diaphoresis and fever.  HENT:  Negative for congestion, ear pain, rhinorrhea and sore throat.   Eyes:  Negative for pain and visual disturbance.  Respiratory:  Negative for cough and shortness of breath.   Cardiovascular:  Negative for chest pain and palpitations.  Gastrointestinal:  Negative for abdominal pain, constipation, diarrhea, nausea and vomiting.  Musculoskeletal:  Positive for myalgias. Negative for arthralgias and back pain.       Hip pain (left)  Skin:  Negative for color change and rash.  Neurological:  Negative for dizziness, seizures, syncope, light-headedness and numbness.  Psychiatric/Behavioral:  Negative for confusion.   All other systems reviewed and are negative.  Physical Exam Updated Vital Signs BP (!) 141/64 (BP Location: Left Arm)    Pulse 81    Temp 98 F (36.7 C) (Oral)    Resp 18    Ht 5\' 4"  (1.626 m)    Wt 56.7 kg    SpO2 100%    BMI  21.46 kg/m  Physical Exam Vitals and nursing note reviewed.  Constitutional:      General: She is not in acute distress.    Appearance: She is well-developed. She is not ill-appearing or diaphoretic.  HENT:     Head: Normocephalic and atraumatic.  Eyes:     Conjunctiva/sclera: Conjunctivae normal.  Cardiovascular:     Rate and Rhythm: Normal rate and regular rhythm.     Pulses:          Radial pulses are 2+ on the right side and 2+ on the left side.       Posterior tibial pulses are 2+ on the right side and 2+ on the left side.     Heart sounds: Normal heart sounds. No murmur  heard. Pulmonary:     Effort: Pulmonary effort is normal. No respiratory distress.     Breath sounds: Normal breath sounds.  Abdominal:     General: Bowel sounds are normal.     Palpations: Abdomen is soft.     Tenderness: There is no abdominal tenderness.  Musculoskeletal:        General: No swelling.     Cervical back: Neck supple.     Right hip: Decreased range of motion. Decreased strength.     Left hip: Tenderness present. Decreased range of motion. Decreased strength.     Right upper leg: No swelling, edema, deformity or lacerations.     Left upper leg: Tenderness present. No swelling, edema, deformity or lacerations.     Right knee: No ecchymosis or lacerations. No tenderness.     Left knee: No ecchymosis or lacerations. No tenderness.     Right lower leg: No deformity or tenderness. No edema.     Left lower leg: No deformity or tenderness. No edema.     Right ankle: Normal.     Left ankle: Normal.     Comments: Overall limited range of motion and strength.  Left is worse than the right due to motion-correlated tenderness TTP of left hip is specifically identified as lateral, not posterior  Skin:    General: Skin is warm and dry.     Capillary Refill: Capillary refill takes less than 2 seconds.     Coloration: Skin is not jaundiced.  Neurological:     Mental Status: She is alert and oriented to person, place, and time.  Psychiatric:        Mood and Affect: Mood normal.    ED Results / Procedures / Treatments   Labs (all labs ordered are listed, but only abnormal results are displayed) Labs Reviewed - No data to display  EKG None  Radiology DG Hip Unilat With Pelvis 2-3 Views Left  Result Date: 02/03/2021 CLINICAL DATA:  Left hip pain. EXAM: DG HIP (WITH OR WITHOUT PELVIS) 2-3V LEFT COMPARISON:  None. FINDINGS: There is no evidence of hip fracture or dislocation. There is no evidence of arthropathy or other focal bone abnormality. IMPRESSION: Negative. Electronically  Signed   By: Franchot Gallo M.D.   On: 02/03/2021 14:31    Procedures Procedures    Medications Ordered in ED Medications  oxyCODONE (Oxy IR/ROXICODONE) immediate release tablet 5 mg (5 mg Oral Given 02/03/21 1517)    ED Course/ Medical Decision Making/ A&P                           Medical Decision Making Amount and/or Complexity of Data Reviewed Radiology: ordered.  Risk Prescription  drug management.   This patient presents to the ED for concern of left hip pain, this involves an extensive number of treatment options, and is a complaint that carries with it a high risk of complications and morbidity.  The differential diagnosis includes fracture, dislocation, muscle spasm, osteoarthritis, bursitis.   Co morbidities that complicate the patient evaluation  Multiple sclerosis, frailty syndrome, functional quadriplegia, osteoporosis   Additional history obtained:  Additional history obtained from internal records   Imaging Studies ordered:  I ordered imaging studies including x-ray of the left hip and pelvis and CT of the left hip. I independently visualized and interpreted imaging which showed no acute fracture pathology of the left hip or femur and the x-ray.   CT of left hip is pending. I agree with the radiologist interpretation   Medicines ordered and prescription drug management:  I ordered medication including one time dose of oxycodone IR for pain management I have reviewed the patients home medicines and have made adjustments as needed   Test Considered:  Consider MRI if CT negative for occult fracture.   Problem List / ED Course:  81 year old female functional quadriplegic with increasing left hip pain over the last month.  Unlikely dislocation due to negative x-ray findings, lack of trauma or fall and presentation.  Left leg does seem slightly longer than the right leg upon physical exam.  We will use CT to further assess for occult fracture.  Patient  is experiencing myalgias that are worse with palpation.  Has chronic muscle pains that she attributes to her progressive multiple sclerosis.  Gabapentin and Norco sometimes help relieve these pains.  She has not been managed closely by primary care practitioner and instead for the last few months has been participating in telehealth visits.  She does not recall being evaluated by an orthopedic specialist or pain specialist.   If all imaging negative for acute pathology, likely stable enough to send home and follow up outpatient with orthopedics.  Recommend following up with a PCP outpatient as well for optimal management of her multiple sclerosis. Care transferred to oncoming provider who will follow up on remaining work up and will determine disposition.   Reevaluation:  After the interventions noted above, I reevaluated the patient and found that they have had some improvement after pain management provided.   Social Determinants of Health:  Nursing home, bed-bound, functional quadriplegic          Final Clinical Impression(s) / ED Diagnoses Final diagnoses:  Left hip pain    Rx / DC Orders ED Discharge Orders     None         Candace Cruise [id number]    Daleen Bo, MD 02/03/21 1752

## 2021-02-03 NOTE — ED Provider Notes (Signed)
Care handoff from Kathie Dike, PA-C at shift change. Please see their not for further information.  Briefly: Patient with hx of MS and associated quadriplegia presents with L hip pain for past month. Left leg is longer than right.  Plan: DG hip negative for fracture or dislocation. Plan to get CT.  I, Lurena Nida, PA-C, personally reviewed and evaluated these images and lab results supported by medical decision making  CT without evidence of acute fracture or dislocation. Mild left hip osteoarthritis. Bowel loop containing left inguinal hernia. No evidence of bowel obstruction.  Hernia reduction  Date/Time: 02/03/2021 4:36 PM Performed by: Silva Bandy, PA-C Authorized by: Silva Bandy, PA-C  Consent: Verbal consent obtained. Written consent not obtained. Risks and benefits: risks, benefits and alternatives were discussed Consent given by: patient Patient understanding: patient states understanding of the procedure being performed Patient identity confirmed: verbally with patient Local anesthesia used: no  Anesthesia: Local anesthesia used: no  Sedation: Patient sedated: no  Patient tolerance: patient tolerated the procedure well with no immediate complications  Patient with hernia reduction, no immediate concerns associated with this. CT negative otherwise. Will keep with plan to d/c with pain management and ortho follow-up. Patient is bedbound at baseline.    Vear Clock 02/03/21 1643    Mancel Bale, MD 02/03/21 [phone]

## 2021-02-03 NOTE — ED Notes (Signed)
Pt in bed, pt denies pain, pt has no requests at this time, pt awaits a ride home.

## 2021-02-03 NOTE — ED Triage Notes (Signed)
Pt to er hallway D via ems, per ems pt is from home, states that she has a hx of MS and has had increasing L hip pain, denies fall or trauma.

## 2021-02-03 NOTE — ED Provider Notes (Signed)
°  Face-to-face evaluation   History: She presents for evaluation of left hip pain present for several weeks.  She has been bedbound for 3 years because of MS.  No recent trauma.  She came here by EMS today.  Physical exam: Elderly female alert and cooperative.  Left hip tender to palpation, and guards against passive range of motion.  Mild leg discrepancy, left is longer.  MDM: Evaluation for  Chief Complaint  Patient presents with   Hip Pain     Patient who is completely bedbound is presenting for left hip pain of nonspecific nature.  Screening evaluation indicates tender left hip, without other localizing symptoms for pain.  X-ray and CT imaging, are reassuring for lack of acute abnormalities.  Pain is likely muscular due to her chronic disability.  I doubt that this discomfort is due to her multiple sclerosis.  Medical screening examination/treatment/procedure(s) were conducted as a shared visit with non-physician practitioner(s) and myself.  I personally evaluated the patient during the encounter    Mancel Bale, MD 02/03/21 [phone]

## 2021-02-03 NOTE — ED Notes (Signed)
[facility] called for transportation  

## 2021-02-03 NOTE — ED Notes (Signed)
[facility] CALLED AT 18:28

## 2021-02-09 ENCOUNTER — Other Ambulatory Visit: Payer: Self-pay

## 2021-02-09 ENCOUNTER — Other Ambulatory Visit: Payer: Medicare Other

## 2021-02-09 ENCOUNTER — Telehealth: Payer: Self-pay

## 2021-02-09 DIAGNOSIS — Z515 Encounter for palliative care: Secondary | ICD-10-CM

## 2021-02-11 ENCOUNTER — Encounter: Payer: Self-pay | Admitting: Family Medicine

## 2021-02-11 ENCOUNTER — Other Ambulatory Visit: Payer: Medicare Other

## 2021-02-11 ENCOUNTER — Other Ambulatory Visit: Payer: Self-pay | Admitting: Family Medicine

## 2021-02-11 DIAGNOSIS — Z515 Encounter for palliative care: Secondary | ICD-10-CM

## 2021-02-11 DIAGNOSIS — G35 Multiple sclerosis: Secondary | ICD-10-CM

## 2021-02-11 NOTE — Progress Notes (Signed)
PATIENT NAME: Suzanne Kim DOB: Jan 06, 1941 MRN: [id number]  PRIMARY CARE PROVIDER: Dr. Wylene Simmer  RESPONSIBLE PARTY:  Acct ID - Guarantor Home Phone Work Phone Relationship Acct Type  [id number] Suzanne Kim* [phone]  Self P/F     [address] [place], [place] [address]   [facility]UNITY P[facility]IATIVE CARE RN NOTE  PRIMARY CARE PROVIDER: Dr. Wylene Simmer RESPONSIBLE PARTY: self  PLAN OF CARE and INTERVENTION:  ADVANCE CARE PLANNING/GOALS OF CARE: Goal is to maintain comfort, safety, remain in home PATIENT/CAREGIVER EDUCATION: Palliative Care services, provided contact information, encouraged to call with questions and concerns. DISEASE STATUS: RN Palliative care telephonic encounter completed with patient. Patient able to communicate her needs and report condition. Verbalized an increase in muscle spasms to both of her legs. She is currently on gabapentin and Skelaxin, but this has only been ineffective. Per patient, spasms occur about every 9 seconds throughout the night. Patient noted that she was receiving PT, but this increased the spasms. Patient recently changed PCPs back to Dr. Wylene Simmer. Will update PCP and Palliative NP on patient's spasms.  HISTORY OF PRESENT ILLNESS: This is a 81 year old female residing at her home. Diagnosis includes but not be limited to progressive MS. Palliative care team to continue to follow to support with goals of care and complex decision making.  CODE STATUS: DNR MOST FORM: No PPS: 30%  PHYSICAL EXAM: Deferred     (Duration of Visit and documentation: 35 minutes)    Suzanne Beams, RN, BSN

## 2021-02-15 ENCOUNTER — Other Ambulatory Visit: Payer: Self-pay

## 2021-02-15 NOTE — Progress Notes (Signed)
PATIENT NAME: Suzanne Kim DOB: [birth year] MRN: [id number]  PRIMARY CARE PROVIDER: [facility]  RESPONSIBLE PARTY:  Acct ID - Guarantor Home Phone Work Phone Relationship Acct Type  [id number] MAYO, OWCZARZAK* [phone]  Self P/F     [address], [place], [place] [address]    PLAN OF CARE and INTERVENTIONS:               1.  GOALS OF CARE/ ADVANCE CARE PLANNING: Remain in home, comfort, safety               2.  PATIENT/CAREGIVER EDUCATION:  Education provided on new medication ordered               3 PERSONAL EMERGENCY PLAN:  Reach out to neighbor/friend, call 911 as needed               4.  DISEASE STATUS: RN Palliative care encounter completed with patient. Updated patient that Palliative NP has been made aware of reports of increased muscle spasms at night that are not relieved by current medicine regime. Requested patient to clarify current dose of gabapentin and to inquire if patient has a neurologist. Patient able to clarify she is taking Gabapentin 300 mg Q HS. She has not seen a neurologist in about 5 years. [name] NP updated on this and reviewed patient record. Changes in patient's medication regime as follows: to begin a low does Klonopin at bedtime, Stop Skelaxin. Education provided to patient that if she feels she needs an oxycodone to separate this from Klonopin for 2-3 hours. Patient expressed understanding. Follow up visit scheduled for Friday 02/19/21.   HISTORY OF PRESENT ILLNESS:    CODE STATUS: DNR ADVANCED DIRECTIVES: Y MOST FORM: Yes PPS: 30%   PHYSICAL EXAM: Deferred       Estanislado Pandy, RN

## 2021-02-16 ENCOUNTER — Other Ambulatory Visit: Payer: Medicare Other

## 2021-02-16 ENCOUNTER — Other Ambulatory Visit: Payer: Self-pay

## 2021-02-16 DIAGNOSIS — Z515 Encounter for palliative care: Secondary | ICD-10-CM

## 2021-02-16 NOTE — Progress Notes (Signed)
COMMUNITY PALLIATIVE CARE SW NOTE  PATIENT NAME: Suzanne Kim DOB: [birth year] MRN: [id number]  PRIMARY CARE PROVIDER: [facility]  RESPONSIBLE PARTY:  Acct ID - Guarantor Home Phone Work Phone Relationship Acct Type  [id number] [name], SIX* [phone]  Self P/F     [address], [place], [place] [address]   Due to the COVID-19 crisis, this virtual check-in visit was done via telephone from my office and it was initiated and consent by this patient and or family.  SOCIAL WORK TELEPHONIC ENCOUNTER (1:30 pm- 1:50 pm)  PC SW completed a virtual telephonic check-in with patient to provide support to her. The patient stated she was taking a new medication that was making her feel as if she has a hangover.  She was not sure what she should do next. SW advised her that she will discuss her concerns with the nurse. Patient reported that she was not sure the difference between palliative car and remote health. SW provided education regarding palliative care services, which she said she was familiar with after all. Patient states that she is expecting a visit from the palliative care RN tomorrow. Patient verbalized no other concerns. SW encouraged patient to call. SW reinforced access to ongoing support.    [address] [place], [place]

## 2021-02-17 ENCOUNTER — Other Ambulatory Visit: Payer: Medicare Other

## 2021-02-17 DIAGNOSIS — Z515 Encounter for palliative care: Secondary | ICD-10-CM

## 2021-02-18 ENCOUNTER — Other Ambulatory Visit: Payer: Self-pay

## 2021-02-18 ENCOUNTER — Other Ambulatory Visit: Payer: Medicare Other

## 2021-02-18 DIAGNOSIS — Z515 Encounter for palliative care: Secondary | ICD-10-CM

## 2021-02-19 ENCOUNTER — Other Ambulatory Visit: Payer: Self-pay

## 2021-02-19 ENCOUNTER — Other Ambulatory Visit: Payer: Medicare Other

## 2021-02-19 VITALS — BP 130/60 | HR 66 | Resp 18

## 2021-02-19 DIAGNOSIS — Z515 Encounter for palliative care: Secondary | ICD-10-CM

## 2021-02-20 NOTE — Progress Notes (Signed)
PATIENT NAME: Suzanne Kim DOB: 24-Jun-1940 MRN: [id number]  PRIMARY CARE PROVIDER: [facility]  RESPONSIBLE PARTY:  Acct ID - Guarantor Home Phone Work Phone Relationship Acct Type  [id number] Suzanne Kim* [phone]  Self P/F     [address] [place], [place] [address]   COMMUNITY PALLIATIVE CARE RN NOTE PLAN OF CARE and INTERVENTION:  ADVANCE CARE PLANNING/GOALS OF CARE: to remain in her home, DNR. CAREGIVER EDUCATION: Medication regime DISEASE STATUS: RN Palliative care visit completed today with patient in her home. Patient up out of bed to recliner chair in her living room. Alert, Oriented x 4, verbally engaging. Patient shared some social history with a good sense of humor, laughing throughout the visit. Reports that she has not been able to sleep due to the increased muscle spasms in her legs. She has not found any relief with the recent medication changes. She shares that at night when her legs spasm, it occurs approximately every 9 seconds and will cause her right foot to cross over her left foot. She is unable to pull them apart. She reports the muscle spasms have caused her legs to become painful that she describes as soreness. PO intake has declined. She used to clean her plate but now is unable to finish her meals. She will eat breakfast, dinner, and a midnight snack but does not eat lunch most days. Patient denies any difficulties swallowing. Connected with Suzanne Braun NP via Teams for further evaluation. Suzanne Braun NP made the following med changes: Increase Gabapentin from 300 mg to 400mg  and to take this with the Skelaxin 800 mg at dinner time. Oxycodone 5 mg at bedtime. Patient may take Klonopin 0.25mg -0.5mg  if needed in the night. Instructions written out for patient and for caregiver. Patient expressed understanding. Plan is to follow up with patient early next week to assess effectiveness of med changes. Contact information left for patient and encouraged her to  call with any concerns/questions.  HISTORY OF PRESENT ILLNESS: This is an 81 year old female with ESRD. Palliative care has been asked to follow for additional support, care and complex decision making.   CODE STATUS: DNR PPS: 30% Physical Exam:  Pulmonary: LS CTA, no audible cough or congestion Cardiac: RRR, denies any chest pain, mild non-pitting edema Neurological: extremities rigid, contractures to hands, progressive weakness, non-ambulatory, transferred with a 96.  Abdomen: soft non-tender, denies any nausea/vomiting/constipation Skin: hands and feet cool to touch, no visible wounds. Patient denies any open areas.       Duration: 90 min

## 2021-02-20 NOTE — Progress Notes (Signed)
COMMUNITY PALLIATIVE CARE RN NOTE    PLAN OF CARE and INTERVENTION:  ADVANCE CARE PLANNING/GOALS OF CARE: Comfort, safety CAREGIVER EDUCATION: Palliative care, safety DISEASE STATUS: RN Palliative telephonic encounter completed to follow-up on patient's leg spasms and response to Klonopin. Patient shared that she felt very drowsy the next morning after taking the Klonopin. Palliative care NP updated and directed to take  of the tablet at bedtime. Patient updated.  HISTORY OF PRESENT ILLNESS: This is a 81 year old female with MS. Palliative care has been asked to follow for additional support, care and complex decision making.   CODE STATUS: DNR PPS: 30% Physical Exam: deferred      Duration: 30 min       Dietrich Pates, BSN

## 2021-02-20 NOTE — Progress Notes (Signed)
PATIENT NAME: Suzanne Kim DOB: [birth year] MRN: [id number]  PRIMARY CARE PROVIDER: [facility]   RESPONSIBLE PARTY:  Acct ID - Guarantor Home Phone Work Phone Relationship Acct Type  [id number] Suzanne Kim, SPANO* [phone]  Self P/F     [address] [place], [place] [address]     RN Palliative care telephonic encounter completed today. Patient called to provide update that 1/2 of the Klonopin did not help at all and she ended up taking the whole dose. She also took 1/2 of the Skelaxin with minimal to no effect. Patient inquired if this was ok to continue. Spoke with Palliative care NP, [name], who approved this. Patient updated and confirmed in person visit scheduled for 02/19/21         Estanislado Pandy, RN

## 2021-02-20 NOTE — Telephone Encounter (Signed)
error 

## 2021-02-22 ENCOUNTER — Other Ambulatory Visit: Payer: Medicare Other

## 2021-02-23 ENCOUNTER — Other Ambulatory Visit: Payer: Self-pay

## 2021-02-23 ENCOUNTER — Other Ambulatory Visit: Payer: Medicare Other

## 2021-02-23 DIAGNOSIS — Z515 Encounter for palliative care: Secondary | ICD-10-CM

## 2021-02-23 NOTE — Progress Notes (Signed)
PATIENT NAME: VERDA MEHTA DOB: [birth year] MRN: [id number]  PRIMARY CARE PROVIDER: [facility]  RESPONSIBLE PARTY:  Acct ID - Guarantor Home Phone Work Phone Relationship Acct Type  [id number] NYLIAH, NIERENBERG* [phone]  Self P/F     [address], [place], [place] [address]  COMMUNITY PALLIATIVE CARE RN NOTE Nadeen Landau    PLAN OF CARE and INTERVENTION:  ADVANCE CARE PLANNING/GOALS OF CARE: to remain in her home, DNR. CAREGIVER EDUCATION: Medication regime DISEASE STATUS: RN Palliative care telephonic visit completed to follow- up on effectiveness of medication changes made by [name] NP on Friday 02/19/21. Spoke with patient who shared that there has been some improvement with muscle spasms. Reviewed medication regime with patient. Patient was taking Gabapentin 400mg  and Skelaxin at dinner time. She then takes  oxycodone before bed. Reviewed with patient that NP ok'd a full tablet of oxycodone and able to take  Klonopin in the night if needed. Patient able to say back what med regime is. Plan to follow-up with patient to check effectiveness.  HISTORY OF PRESENT ILLNESS: This is a 80 year old female with MS. Palliative care has been asked to follow for additional support, care and complex decision making.   CODE STATUS: DNR PPS: 30% Physical Exam: deferred      Duration: 30 min       76 RN, BSN   Beryle Beams, RN

## 2021-02-24 ENCOUNTER — Encounter: Payer: Self-pay | Admitting: Neurology

## 2021-05-19 ENCOUNTER — Ambulatory Visit: Payer: Medicare Other | Admitting: Neurology

## 2021-05-26 ENCOUNTER — Other Ambulatory Visit: Payer: Medicare Other

## 2021-05-26 DIAGNOSIS — Z515 Encounter for palliative care: Secondary | ICD-10-CM

## 2021-05-26 NOTE — Progress Notes (Signed)
COMMUNITY PALLIATIVE CARE SW NOTE ? ?PATIENT NAME: VASHAWN RIGGEN ?DOB: 1940-04-06 ?MRN: [id number] ? ?PRIMARY CARE PROVIDER: [facility] ? ?RESPONSIBLE PARTY:  ?Acct ID - Guarantor Home Phone Work Phone Relationship Acct Type  ?[id number] CLARINDA, YALDA* [phone]  Self P/F  ?   [address], [place], [place] [address]  ? ?SOCIAL WORK TELEPHONIC ENCOUNTER (12:10 pm-12: 20 pm) ? ?PC SW completed a telephonic encounter with patient to assess her status, comfort and needs. Patient provided a status update on herself. She advised that she had a neurology appointment yesterday, but had to cancel due to transportation issues. She is planning to reschedule that appointment. Patient report that she continues to have [facility] and this has worked out well for her. She report that she is eating and sleeping well. Her weight has been stable. She has some trouble with her left legs due to muscle spasms. Her gabapentin was increased to 2 and a muscle relaxer and this has been effective with those spasms. She is also taking Claritin daily for her allergies and this has been helpful. Patient has not had any issues taking her medications. Patient has in-home caregivers daily and had no other concerns. Patient remains open to ongoing support from the palliative care/SW visit.  ? ? ?Katheren Puller, LCSW ? ?

## 2021-07-15 NOTE — Progress Notes (Signed)
NEUROLOGY CONSULTATION NOTE  Suzanne Kim MRN: [id number] DOB: 04/26/1940  Referring provider: Renaldo Harrison, NP Primary care provider: Alfred Levins, NP  Reason for consult:  multiple sclerosis.  Assessment/Plan:   Multiple sclerosis, inactive secondary progressive  To improve pain management, will have her take 100mg  during the morning or during afternoon if needed in addition to 300mg  at night. Follow up 6 months.   Subjective:  Suzanne Kim is an 81 year old right-handed woman with history of osteoporosis and multiple sclerosis, who follows up for secondary progressive multiple sclerosis.  She is accompanied by two aides who supplements history.   HISTORY: Current medications:  gabapentin 300mg  at bedtime, Skelaxin 800mg  daily, oxycodone, vit D, Zinc    Onset occurred at age 9.  She describes her initial symptoms as a "gray cloud slowly cover(ing) my vision horizontally".  She would experience these episodes occasionally over the next several years.  In 1986, she had an attack described as numbness from her chest down, as well as weakness in the lower extremities.  At this time, she was evaluated by two neurologists, Dr. and Dr. .  She was told that she did not have MS based on the MRI alone.  The following year, she began stubbing her right toe often.  This progressed to increased difficulty walking, mostly in her right leg, as well as right foot drop.  In 1988, she then saw another neurologist, Dr. 1987, who told her he thought she did have MS, but did not recommend invasive testing, such as an LP. She was never started on an immunomodulatory agent.  She was prescribed Symatril for fatigue and followed up with Dr. Ninetta Lights until 1991, when her care was transferred to Dr. 1989.  Various blood work was ordered, which revealed an elevated ANA of 1280.  [facility]  She underwent multiple tests,  including MRI, CT and blood work, and was formally diagnosed with lupus.    She was treated for lupus with Plaquenil from 1991 to 1999.  As her walking continued to progressively get worse, she was Dr. 1992 at the [facility].  He suspected primary progressive MS. She did finally undergo an LP, as well as MRI.  MRI of the brain and cervical spine from 08/31/92 was unremarkable and did not show areas of signal abnormality to suggest demyelinating disease.  Results were "inconclusive" but still diagnosed her with primary progressive MS.  He offered to try Copaxone, but since it is not FDA-approved for PPMS, she would have to pay out of pocket, so she declined.  Regarding the diagnosis of lupus, she was evaluated by two other rheumatologists in [place], who doubted the diagnosis and workup reportedly did not suggest a connective tissue disease.  She eventually had been under the care of Dr. Elwyn Lade.  She reportedly had VEPs which were positive for optic neuropathy.  Since 2019 following hospitalization for upper GI bleed, she has been completely non-ambulatory.     She has history of back pain and degenerative disc disease, as well as osteoarthritis of the hips.  MRI of lumbar spine with and without contrast was performed on 06/21/10, which revealed mild degenerative changes of the discs and facet joints, but no central or neural foraminal stenosis.  She has been treated for pain in the past with lumbar epidural steroid injections, which were ineffective.  Celebrex and hydrocodone have been ineffective.  She is treated for chronic  pain with oxycodone and gabapentin 300mg  at bedtime.  Sometimes she will have spasms where her legs shake.      Prior medications include:  Baclofen (for muscle spasms, ineffective), cyclobenzaprine 10mg , tizanidine (ineffective and causes grogginess), Symatril (for fatigue), Prozac, hydrocodone, Celebrex     PAST MEDICAL HISTORY: Past Medical History:  Diagnosis Date    Hiatal hernia    Multiple sclerosis (HCC) 1988    PAST SURGICAL HISTORY: Past Surgical History:  Procedure Laterality Date   BREAST ENHANCEMENT SURGERY  1975   ESOPHAGOGASTRODUODENOSCOPY (EGD) WITH PROPOFOL N/A 05/12/2016   Procedure: ESOPHAGOGASTRODUODENOSCOPY (EGD) WITH PROPOFOL;  Surgeon: , MD;  Location: [facility];  Service: Endoscopy;  Laterality: N/A;   ESOPHAGOGASTRODUODENOSCOPY (EGD) WITH PROPOFOL N/A 01/23/2018   Procedure: ESOPHAGOGASTRODUODENOSCOPY (EGD) WITH PROPOFOL;  Surgeon: Bernette Redbird, MD;  Location: [facility];  Service: Endoscopy;  Laterality: N/A;   TONSILLECTOMY     TOTAL VAGINAL HYSTERECTOMY     WISDOM TOOTH EXTRACTION      MEDICATIONS: Current Outpatient Medications on File Prior to Visit  Medication Sig Dispense Refill   albuterol (PROVENTIL) (2.5 MG/3ML) 0.083% nebulizer solution Take 2.5 mg by nebulization daily.     clonazePAM (KLONOPIN) 0.5 MG tablet Take 0.5 mg by mouth at bedtime. Phasic spasticity of BLE at HS with SPMS     ferrous sulfate 325 (65 FE) MG EC tablet Take 325 mg by mouth daily with breakfast.     gabapentin (NEURONTIN) 100 MG capsule Take 300 mg by mouth at bedtime.     Multiple Vitamin (MULTIVITAMIN WITH MINERALS) TABS tablet Take 1 tablet by mouth daily.     oxyCODONE (ROXICODONE) 5 MG immediate release tablet Take 1 tablet (5 mg total) by mouth every 4 (four) hours as needed for severe pain. 30 tablet 0   VITAMIN D PO Take 1 capsule by mouth daily.     No current facility-administered medications on file prior to visit.    ALLERGIES: No Known Allergies  FAMILY HISTORY: Family History  Problem Relation Age of Onset   Heart failure Father    Ataxia Neg Hx    Chorea Neg Hx    Dementia Neg Hx    Mental retardation Neg Hx    Migraines Neg Hx    Multiple sclerosis Neg Hx    Neurofibromatosis Neg Hx    Neuropathy Neg Hx    Parkinsonism Neg Hx    Seizures Neg Hx    Stroke Neg Hx     Objective:   Vitals:    07/16/21 1512  BP: (!) 149/85  Pulse: 96  SpO2: 97%    General: No acute distress.  Patient appears well-groomed.   Head:  Normocephalic/atraumatic Eyes:  fundi examined but not visualized Neck: supple, no paraspinal tenderness, decreased range of motion Back:  paraspinal tenderness Heart: regular rate and rhythm Neurological Exam: Mental status: alert and oriented to person, place, and time, speech fluent and not dysarthric, language intact. Cranial nerves: CN I: not tested CN II: pupils equal, round and reactive to light, visual fields intact CN III, IV, VI:  full range of motion, no nystagmus, no ptosis CN V: facial sensation intact. CN VII: upper and lower face symmetric CN VIII: hearing intact CN IX, X: gag intact, uvula midline CN XI: sternocleidomastoid and trapezius muscles intact CN XII: tongue midline Bulk & Tone: spastic in all extremities Motor:  muscle strength 4+/5 upper extremities bilaterally, 3-/5 left hip flexion, otherwise plegic in lower extremities.  Sensation:  Pinprick, temperature and vibratory sensation intact. Deep Tendon Reflexes:  3+ throughout,  left Hoffman sign, bilateral Babinski  Finger to nose testing:  Without dysmetria.   Gait:  Non-ambulatory    Thank you for allowing me to take part in the care of this patient.  Shon Millet, DO  CC: Renaldo Harrison, NP

## 2021-07-16 ENCOUNTER — Ambulatory Visit (INDEPENDENT_AMBULATORY_CARE_PROVIDER_SITE_OTHER): Payer: Medicare Other | Admitting: Neurology

## 2021-07-16 ENCOUNTER — Encounter: Payer: Self-pay | Admitting: Neurology

## 2021-07-16 VITALS — BP 149/85 | HR 96 | Ht 62.0 in

## 2021-07-16 DIAGNOSIS — G35 Multiple sclerosis: Secondary | ICD-10-CM | POA: Diagnosis not present

## 2021-07-16 MED ORDER — GABAPENTIN 100 MG PO CAPS: ORAL_CAPSULE | ORAL | 5 refills | Status: DC

## 2021-07-16 NOTE — Patient Instructions (Signed)
Increase gabapentin - Still take 3 pills at bedtime.  But may take 1 pill in morning and 1 pill in afternoon if needed

## 2021-07-19 ENCOUNTER — Telehealth: Payer: Self-pay

## 2021-07-19 MED ORDER — GABAPENTIN 100 MG PO CAPS: ORAL_CAPSULE | ORAL | 0 refills | Status: DC

## 2021-07-19 MED ORDER — GABAPENTIN 300 MG PO CAPS: 300.0000 mg | ORAL_CAPSULE | Freq: Every day | ORAL | 0 refills | Status: DC

## 2021-07-19 NOTE — Telephone Encounter (Signed)
Fax receied from [facility]:  Message: Insurance will not cover script as written. #150 cap TID may take 1 cap imm morning and 1 in afternoon if needed.    Please send in 2 scripts:  300 mg cap at bedtime #30 and  100 mg #60 1 in the Am and 1 at noon as needed.

## 2021-07-19 NOTE — Telephone Encounter (Signed)
Spoke to scott at [facility] please cancel scripts for Gabapentin 300 mg and 100 mg

## 2021-07-19 NOTE — Addendum Note (Signed)
Addended by: Leida Lauth on: 07/19/2021 03:57 PM   Modules accepted: Orders

## 2021-07-19 NOTE — Telephone Encounter (Signed)
Rx sent for Gabapentin 300mg : take 1 capsule every night. And for Gabapentin 100mg : Take 1 capsule in AM and 1 capsule at noon as needed. Pls inform patient of medication dose changes so she is aware. Thanks

## 2021-07-23 ENCOUNTER — Other Ambulatory Visit: Payer: Medicare Other | Admitting: *Deleted

## 2021-07-23 DIAGNOSIS — Z515 Encounter for palliative care: Secondary | ICD-10-CM

## 2021-07-23 NOTE — Progress Notes (Signed)
[facility]ALL[facility] RN NOTE  PATIENT NAME: [name] DOB: 1940/05/18 MRN: [id number]  PRIMARY CARE PROVIDER: [facility]  RESPONSIBLE PARTY: Eloise Harman (friend) Acct ID - Guarantor Home Phone Work Phone Relationship Acct Type  [id number] TORYN, DEWALT* [phone]  Self P/F     [address] [place], [place] [address]   RN follow up encounter completed with patient via telephone. She denies pain or discomfort at time of conversation. She does experience some neuropathic pain and spasms in her legs at times and is taking Gabapentin 300 mg at bedtime. She was recently seen by Neurology who states she can take an additional 100 mg in the morning and afternoon if needed in addition to nighttime dose for better pain management. She continues with hired caregivers to assist with personal care. She is non-ambulatory and is transferred via [facility] lift. Her appetite remains good. She feels her condition is about the same and denies any needs at this time. She knows she can call with any questions/concerns. Palliative care will continue to follow.    [name], RN BSN

## 2021-08-02 ENCOUNTER — Encounter: Payer: Self-pay | Admitting: Neurology

## 2022-01-18 ENCOUNTER — Ambulatory Visit: Payer: Medicare Other | Admitting: Neurology

## 2022-01-23 NOTE — Progress Notes (Unsigned)
NEUROLOGY FOLLOW UP NOTE  CASH MEADOW MRN: [id number] DOB: [birth year]   Assessment/Plan:   Multiple sclerosis, inactive secondary progressive, stable  Gabapentin 100mg  in morning and 300mg  at bedtime Follow up one year.   Subjective:  Suzanne Kim is an 82 year old right-handed woman with history of osteoporosis and multiple sclerosis, who follows up for secondary progressive multiple sclerosis.  She is accompanied by two aides who supplements history.   UPDATE: Current medications:  gabapentin 100mg  during day and 300mg  at bedtime, Skelaxin 800mg  1 to 2 times daily, oxycodone, vit D, Zinc   Taking an extra 100mg  gabapentin in the morning.  This has helped.  She started experiencing some left foot pain.  Her other providers have also increased her Skelaxin to taking an extra dose in the afternoon if needed.  This has helped.  No new concerns.    HISTORY: Onset occurred at age 24.  She describes her initial symptoms as a "gray cloud slowly cover(ing) my vision horizontally".  She would experience these episodes occasionally over the next several years.  In 1986, she had an attack described as numbness from her chest down, as well as weakness in the lower extremities.  At this time, she was evaluated by two neurologists, Dr. and Dr. .  She was told that she did not have MS based on the MRI alone.  The following year, she began stubbing her right toe often.  This progressed to increased difficulty walking, mostly in her right leg, as well as right foot drop.  In 1988, she then saw another neurologist, Dr. , who told her he thought she did have MS, but did not recommend invasive testing, such as an LP. She was never started on an immunomodulatory agent.  She was prescribed Symatril for fatigue and followed up with Dr. 12 until 1991, when her care was transferred to Dr. Ninetta Lights.  Various blood work was ordered, which revealed an elevated ANA of 1280.  She  [facility]  She underwent multiple tests, including MRI, CT and blood work, and was formally diagnosed with lupus.    She was treated for lupus with Plaquenil from 1991 to 1999.  As her walking continued to progressively get worse, she was Dr. Dorina Hoyer at the [facility].  He suspected primary progressive MS. She did finally undergo an LP, as well as MRI.  MRI of the brain and cervical spine from 08/31/92 was unremarkable and did not show areas of signal abnormality to suggest demyelinating disease.  Results were "inconclusive" but still diagnosed her with primary progressive MS.  He offered to try Copaxone, but since it is not FDA-approved for PPMS, she would have to pay out of pocket, so she declined.  Regarding the diagnosis of lupus, she was evaluated by two other rheumatologists in [place], who doubted the diagnosis and workup reportedly did not suggest a connective tissue disease.  She eventually had been under the care of Dr. Carlyon Prows.  She reportedly had VEPs which were positive for optic neuropathy.  Since 2019 following hospitalization for upper GI bleed, she has been completely non-ambulatory.     She has history of back pain and degenerative disc disease, as well as osteoarthritis of the hips.  MRI of lumbar spine with and without contrast was performed on 06/21/10, which revealed mild degenerative changes of the discs and facet joints, but no central or neural foraminal stenosis.  She has been  treated for pain in the past with lumbar epidural steroid injections, which were ineffective.  Celebrex and hydrocodone have been ineffective.  She is treated for chronic pain with oxycodone and gabapentin 300mg  at bedtime.  Sometimes she will have spasms where her legs shake.      Prior medications include:  Baclofen (for muscle spasms, ineffective), cyclobenzaprine 10mg , tizanidine (ineffective and causes grogginess), Symatril (for fatigue), Prozac,  hydrocodone, Celebrex     PAST MEDICAL HISTORY: Past Medical History:  Diagnosis Date   Hiatal hernia    Multiple sclerosis ([place]) 1988    PAST SURGICAL HISTORY: Past Surgical History:  Procedure Laterality Date   BREAST ENHANCEMENT SURGERY  1975   ESOPHAGOGASTRODUODENOSCOPY (EGD) WITH PROPOFOL N/A 05/12/2016   Procedure: ESOPHAGOGASTRODUODENOSCOPY (EGD) WITH PROPOFOL;  Surgeon: Ronald Lobo, MD;  [facility];  Service: Endoscopy;  Laterality: N/A;   ESOPHAGOGASTRODUODENOSCOPY (EGD) WITH PROPOFOL N/A 01/23/2018   Procedure: ESOPHAGOGASTRODUODENOSCOPY (EGD) WITH PROPOFOL;  Surgeon: Gatha Mayer, MD;  [facility];  Service: Endoscopy;  Laterality: N/A;   TONSILLECTOMY     TOTAL VAGINAL HYSTERECTOMY     WISDOM TOOTH EXTRACTION      MEDICATIONS: Current Outpatient Medications on File Prior to Visit  Medication Sig Dispense Refill   albuterol (PROVENTIL) (2.5 MG/3ML) 0.083% nebulizer solution Take 2.5 mg by nebulization daily.     ferrous sulfate 325 (65 FE) MG EC tablet Take 325 mg by mouth daily with breakfast.     gabapentin (NEURONTIN) 100 MG capsule May take 1 capsule in morning and in afternoon if needed. 180 capsule 0   gabapentin (NEURONTIN) 300 MG capsule Take 1 capsule (300 mg total) by mouth at bedtime. 90 capsule 0   metaxalone (SKELAXIN) 800 MG tablet Take 800 mg by mouth daily.     Multiple Vitamin (MULTIVITAMIN WITH MINERALS) TABS tablet Take 1 tablet by mouth daily.     oxyCODONE (ROXICODONE) 5 MG immediate release tablet Take 1 tablet (5 mg total) by mouth every 4 (four) hours as needed for severe pain. 30 tablet 0   VITAMIN D PO Take 1 capsule by mouth daily.     zinc sulfate 220 (50 Zn) MG capsule Take 220 mg by mouth daily.     No current facility-administered medications on file prior to visit.    ALLERGIES: No Known Allergies  FAMILY HISTORY: Family History  Problem Relation Age of Onset   Heart failure Father    Ataxia Neg Hx     Chorea Neg Hx    Dementia Neg Hx    Mental retardation Neg Hx    Migraines Neg Hx    Multiple sclerosis Neg Hx    Neurofibromatosis Neg Hx    Neuropathy Neg Hx    Parkinsonism Neg Hx    Seizures Neg Hx    Stroke Neg Hx     Objective:  Blood pressure 137/83, pulse 95, resp. rate 20, SpO2 97 %. General: No acute distress.  Patient appears well-groomed.   Head:  Normocephalic/atraumatic Eyes:  fundi examined but not visualized Neck: supple, no paraspinal tenderness, decreased range of motion Back:  flexed posture Heart: regular rate and rhythm Neurological Exam: Mental status: alert and oriented to person, place, and time, speech fluent and not dysarthric, language intact.  CN I - XII intact  Bulk & Tone: spastic in all extremities.  Motor:  muscle strength 4+/5 upper extremities bilaterally, 3-/5 left hip flexion, otherwise plegic in lower extremities. Sensation:  Light touch intact. Deep Tendon Reflexes:  3+ throughout  Finger to nose testing:  Without dysmetria.  Gait:  Non-ambulatory   Shon Millet, DO  CC: Renaldo Harrison, NP

## 2022-01-24 ENCOUNTER — Ambulatory Visit (INDEPENDENT_AMBULATORY_CARE_PROVIDER_SITE_OTHER): Payer: Medicare Other | Admitting: Neurology

## 2022-01-24 ENCOUNTER — Encounter: Payer: Self-pay | Admitting: Neurology

## 2022-01-24 VITALS — BP 137/83 | HR 95 | Resp 20

## 2022-01-24 DIAGNOSIS — G35 Multiple sclerosis: Secondary | ICD-10-CM | POA: Diagnosis not present

## 2022-01-24 NOTE — Patient Instructions (Signed)
Continue gabapentin 100mg  in morning and 300mg  at bedtime Follow up in one year.

## 2023-02-01 ENCOUNTER — Ambulatory Visit: Payer: Medicare Other | Admitting: Neurology

## 2023-06-14 NOTE — Progress Notes (Unsigned)
 NEUROLOGY FOLLOW UP NOTE  LATONYA NELON MRN: [id number] DOB: Apr 19, 1940   Assessment/Plan:   Multiple sclerosis, inactive secondary progressive, stable  Gabapentin  100mg  in morning and 300mg  at bedtime *** Follow up one year.   Subjective:  Lonette Stevison is an 83 year old right-handed woman with history of osteoporosis and multiple sclerosis, who follows up for secondary progressive multiple sclerosis.  She is accompanied by two aides who supplements history.   UPDATE: Current medications:  gabapentin  100mg  during day and 300mg  at bedtime, Skelaxin 800mg  1 to 2 times daily, oxycodone , vit D, Zinc   Taking an extra 100mg  gabapentin  in the morning.  This has helped.  She started experiencing some left foot pain.  Her other providers have also increased her Skelaxin to taking an extra dose in the afternoon if needed.  This has helped.  No new concerns.  ***  HISTORY: Onset occurred at age 72.  She describes her initial symptoms as a "gray cloud slowly cover(ing) my vision horizontally".  She would experience these episodes occasionally over the next several years.  In 1986, she had an attack described as numbness from her chest down, as well as weakness in the lower extremities.  At this time, she was evaluated by two neurologists, Dr. Alwin Baars and Dr. Dania Dupre.  She was told that she did not have MS based on the MRI alone.  The following year, she began stubbing her right toe often.  This progressed to increased difficulty walking, mostly in her right leg, as well as right foot drop.  In 1988, she then saw another neurologist, Dr. Beauford Bounds, who told her he thought she did have MS, but did not recommend invasive testing, such as an LP. She was never started on an immunomodulatory agent.  She was prescribed Symatril for fatigue and followed up with Dr. Kalman Ores until 1991, when her care was transferred to Dr. Tilda Fogo.  Various blood work was ordered, which revealed an elevated ANA of 1280.   [facility]  She underwent multiple tests, including MRI, CT and blood work, and was formally diagnosed with lupus.    She was treated for lupus with Plaquenil from 1991 to 1999.  As her walking continued to progressively get worse, she was Dr. Altha Jester at the [facility].  He suspected primary progressive MS. She did finally undergo an LP, as well as MRI.  MRI of the brain and cervical spine from 08/31/92 was unremarkable and did not show areas of signal abnormality to suggest demyelinating disease.  Results were "inconclusive" but still diagnosed her with primary progressive MS.  He offered to try Copaxone, but since it is not FDA-approved for PPMS, she would have to pay out of pocket, so she declined.  Regarding the diagnosis of lupus, she was evaluated by two other rheumatologists in [place], who doubted the diagnosis and workup reportedly did not suggest a connective tissue disease.  She eventually had been under the care of Dr. Sulema Endo.  She reportedly had VEPs which were positive for optic neuropathy.  Since 2019 following hospitalization for upper GI bleed, she has been completely non-ambulatory.     She has history of back pain and degenerative disc disease, as well as osteoarthritis of the hips.  MRI of lumbar spine with and without contrast was performed on 06/21/10, which revealed mild degenerative changes of the discs and facet joints, but no central or neural foraminal stenosis.  She has  been treated for pain in the past with lumbar epidural steroid injections, which were ineffective.  Celebrex and hydrocodone  have been ineffective.  She is treated for chronic pain with oxycodone  and gabapentin  300mg  at bedtime.  Sometimes she will have spasms where her legs shake.      Prior medications include:  Baclofen (for muscle spasms, ineffective), cyclobenzaprine  10mg , tizanidine  (ineffective and causes grogginess), Symatril (for fatigue), Prozac,  hydrocodone , Celebrex     PAST MEDICAL HISTORY: Past Medical History:  Diagnosis Date   Hiatal hernia    Multiple sclerosis (HCC) 1988    PAST SURGICAL HISTORY: Past Surgical History:  Procedure Laterality Date   BREAST ENHANCEMENT SURGERY  1975   ESOPHAGOGASTRODUODENOSCOPY (EGD) WITH PROPOFOL  N/A 05/12/2016   Procedure: ESOPHAGOGASTRODUODENOSCOPY (EGD) WITH PROPOFOL ;  Surgeon: Lanita Pitman, MD;  [facility];  Service: Endoscopy;  Laterality: N/A;   ESOPHAGOGASTRODUODENOSCOPY (EGD) WITH PROPOFOL  N/A 01/23/2018   Procedure: ESOPHAGOGASTRODUODENOSCOPY (EGD) WITH PROPOFOL ;  Surgeon: Kenney Peacemaker, MD;  [facility];  Service: Endoscopy;  Laterality: N/A;   TONSILLECTOMY     TOTAL VAGINAL HYSTERECTOMY     WISDOM TOOTH EXTRACTION      MEDICATIONS: Current Outpatient Medications on File Prior to Visit  Medication Sig Dispense Refill   albuterol (PROVENTIL) (2.5 MG/3ML) 0.083% nebulizer solution Take 2.5 mg by nebulization daily.     ferrous sulfate 325 (65 FE) MG EC tablet Take 325 mg by mouth daily with breakfast.     gabapentin  (NEURONTIN ) 100 MG capsule May take 1 capsule in morning and in afternoon if needed. 180 capsule 0   gabapentin  (NEURONTIN ) 300 MG capsule Take 1 capsule (300 mg total) by mouth at bedtime. 90 capsule 0   metaxalone (SKELAXIN) 800 MG tablet Take 800 mg by mouth daily.     Multiple Vitamin (MULTIVITAMIN WITH MINERALS) TABS tablet Take 1 tablet by mouth daily.     oxyCODONE  (ROXICODONE ) 5 MG immediate release tablet Take 1 tablet (5 mg total) by mouth every 4 (four) hours as needed for severe pain. 30 tablet 0   VITAMIN D  PO Take 1 capsule by mouth daily.     zinc sulfate 220 (50 Zn) MG capsule Take 220 mg by mouth daily.     No current facility-administered medications on file prior to visit.    ALLERGIES: Allergies  Allergen Reactions   Epinephrine Palpitations    FAMILY HISTORY: Family History  Problem Relation Age of Onset    Heart failure Father    Ataxia Neg Hx    Chorea Neg Hx    Dementia Neg Hx    Mental retardation Neg Hx    Migraines Neg Hx    Multiple sclerosis Neg Hx    Neurofibromatosis Neg Hx    Neuropathy Neg Hx    Parkinsonism Neg Hx    Seizures Neg Hx    Stroke Neg Hx     Objective:  *** General: No acute distress.  Patient appears well-groomed.   Head:  Normocephalic/atraumatic Eyes:  fundi examined but not visualized Neck: supple, no paraspinal tenderness, decreased range of motion Back:  flexed posture Heart: regular rate and rhythm Neurological Exam: Mental status: alert and oriented.  Speech fluent and not dysarthric, language intact.  CN II-XII intact. Bulk and tone normal, muscle strength 4+/5 upper extremities bilaterally, 3-/5 left hip flexion, otherwise plegic in lower extremities..  Sensation to light touch intact.  Deep tendon reflexes 3+ throughout, toes downgoing.  Finger to nose testing intact. Non-ambulatory  Janne Members, DO  CC: [facility]

## 2023-06-15 ENCOUNTER — Ambulatory Visit (INDEPENDENT_AMBULATORY_CARE_PROVIDER_SITE_OTHER): Payer: Medicare Other | Admitting: Neurology

## 2023-06-15 ENCOUNTER — Encounter: Payer: Self-pay | Admitting: Neurology

## 2023-06-15 VITALS — BP 98/60 | HR 60 | Ht 60.0 in | Wt 125.0 lb

## 2023-06-15 DIAGNOSIS — G35 Multiple sclerosis: Secondary | ICD-10-CM

## 2023-06-15 NOTE — Patient Instructions (Signed)
 Gabapentin  100mg  during the day and 300mg  at bedtime

## 2023-11-30 ENCOUNTER — Emergency Department (HOSPITAL_COMMUNITY)

## 2023-11-30 ENCOUNTER — Inpatient Hospital Stay (HOSPITAL_COMMUNITY)
Admission: EM | Admit: 2023-11-30 | Discharge: 2023-12-04 | DRG: 177 | Disposition: A | Discharge status: Home / Self Care | Attending: Internal Medicine | Admitting: Internal Medicine

## 2023-11-30 DIAGNOSIS — Z79899 Other long term (current) drug therapy: Secondary | ICD-10-CM

## 2023-11-30 DIAGNOSIS — J9601 Acute respiratory failure with hypoxia: Secondary | ICD-10-CM | POA: Diagnosis present

## 2023-11-30 DIAGNOSIS — R54 Age-related physical debility: Secondary | ICD-10-CM | POA: Diagnosis present

## 2023-11-30 DIAGNOSIS — J690 Pneumonitis due to inhalation of food and vomit: Secondary | ICD-10-CM | POA: Diagnosis present

## 2023-11-30 DIAGNOSIS — Z66 Do not resuscitate: Secondary | ICD-10-CM | POA: Diagnosis present

## 2023-11-30 DIAGNOSIS — Z888 Allergy status to other drugs, medicaments and biological substances status: Secondary | ICD-10-CM | POA: Diagnosis not present

## 2023-11-30 DIAGNOSIS — D696 Thrombocytopenia, unspecified: Secondary | ICD-10-CM | POA: Diagnosis present

## 2023-11-30 DIAGNOSIS — J9811 Atelectasis: Secondary | ICD-10-CM

## 2023-11-30 DIAGNOSIS — J189 Pneumonia, unspecified organism: Secondary | ICD-10-CM | POA: Diagnosis not present

## 2023-11-30 DIAGNOSIS — J9621 Acute and chronic respiratory failure with hypoxia: Secondary | ICD-10-CM | POA: Diagnosis not present

## 2023-11-30 DIAGNOSIS — J45909 Unspecified asthma, uncomplicated: Secondary | ICD-10-CM | POA: Diagnosis present

## 2023-11-30 DIAGNOSIS — J90 Pleural effusion, not elsewhere classified: Secondary | ICD-10-CM | POA: Diagnosis present

## 2023-11-30 DIAGNOSIS — Z90710 Acquired absence of both cervix and uterus: Secondary | ICD-10-CM

## 2023-11-30 DIAGNOSIS — J9311 Primary spontaneous pneumothorax: Secondary | ICD-10-CM

## 2023-11-30 DIAGNOSIS — J159 Unspecified bacterial pneumonia: Principal | ICD-10-CM | POA: Diagnosis present

## 2023-11-30 DIAGNOSIS — G35B0 Primary progressive multiple sclerosis, unspecified: Secondary | ICD-10-CM

## 2023-11-30 DIAGNOSIS — G35D Multiple sclerosis, unspecified: Secondary | ICD-10-CM | POA: Diagnosis present

## 2023-11-30 DIAGNOSIS — W44F9XA Other object of natural or organic material, entering into or through a natural orifice, initial encounter: Secondary | ICD-10-CM | POA: Diagnosis present

## 2023-11-30 DIAGNOSIS — J939 Pneumothorax, unspecified: Secondary | ICD-10-CM | POA: Diagnosis present

## 2023-11-30 DIAGNOSIS — J9312 Secondary spontaneous pneumothorax: Secondary | ICD-10-CM | POA: Diagnosis present

## 2023-11-30 DIAGNOSIS — K449 Diaphragmatic hernia without obstruction or gangrene: Secondary | ICD-10-CM | POA: Diagnosis present

## 2023-11-30 DIAGNOSIS — Z7401 Bed confinement status: Secondary | ICD-10-CM | POA: Diagnosis not present

## 2023-11-30 DIAGNOSIS — Z8249 Family history of ischemic heart disease and other diseases of the circulatory system: Secondary | ICD-10-CM

## 2023-11-30 DIAGNOSIS — T17990A Other foreign object in respiratory tract, part unspecified in causing asphyxiation, initial encounter: Secondary | ICD-10-CM | POA: Diagnosis present

## 2023-11-30 DIAGNOSIS — Z1152 Encounter for screening for COVID-19: Secondary | ICD-10-CM | POA: Diagnosis not present

## 2023-11-30 DIAGNOSIS — R0989 Other specified symptoms and signs involving the circulatory and respiratory systems: Secondary | ICD-10-CM | POA: Diagnosis not present

## 2023-11-30 LAB — RESP PANEL BY RT-PCR (RSV, FLU A&B, COVID)  RVPGX2
Influenza A by PCR: NEGATIVE
Influenza B by PCR: NEGATIVE
Resp Syncytial Virus by PCR: NEGATIVE
SARS Coronavirus 2 by RT PCR: NEGATIVE

## 2023-11-30 LAB — CBC WITH DIFFERENTIAL/PLATELET
Abs Immature Granulocytes: 0.05 K/uL (ref 0.00–0.07)
Basophils Absolute: 0 K/uL (ref 0.0–0.1)
Basophils Relative: 0 %
Eosinophils Absolute: 0.1 K/uL (ref 0.0–0.5)
Eosinophils Relative: 1 %
HCT: 46.2 % — ABNORMAL HIGH (ref 36.0–46.0)
Hemoglobin: 14.7 g/dL (ref 12.0–15.0)
Immature Granulocytes: 0 %
Lymphocytes Relative: 7 %
Lymphs Abs: 0.8 K/uL (ref 0.7–4.0)
MCH: 30.2 pg (ref 26.0–34.0)
MCHC: 31.8 g/dL (ref 30.0–36.0)
MCV: 94.9 fL (ref 80.0–100.0)
Monocytes Absolute: 1.3 K/uL — ABNORMAL HIGH (ref 0.1–1.0)
Monocytes Relative: 10 %
Neutro Abs: 10.3 K/uL — ABNORMAL HIGH (ref 1.7–7.7)
Neutrophils Relative %: 82 %
Platelets: 126 K/uL — ABNORMAL LOW (ref 150–400)
RBC: 4.87 MIL/uL (ref 3.87–5.11)
RDW: 12.9 % (ref 11.5–15.5)
WBC: 12.5 K/uL — ABNORMAL HIGH (ref 4.0–10.5)
nRBC: 0 % (ref 0.0–0.2)

## 2023-11-30 LAB — RESPIRATORY PANEL BY PCR

## 2023-11-30 LAB — BASIC METABOLIC PANEL WITH GFR
Anion gap: 10 (ref 5–15)
BUN: 17 mg/dL (ref 8–23)
CO2: 27 mmol/L (ref 22–32)
Calcium: 9.7 mg/dL (ref 8.9–10.3)
Chloride: 104 mmol/L (ref 98–111)
Creatinine, Ser: 1.03 mg/dL — ABNORMAL HIGH (ref 0.44–1.00)
GFR, Estimated: 54 mL/min — ABNORMAL LOW (ref >60)
Glucose, Bld: 114 mg/dL — ABNORMAL HIGH (ref 70–99)
Potassium: 4.5 mmol/L (ref 3.5–5.1)
Sodium: 140 mmol/L (ref 135–145)

## 2023-11-30 LAB — D-DIMER, QUANTITATIVE: D-Dimer, Quant: 0.69 ug{FEU}/mL — ABNORMAL HIGH (ref 0.00–0.50)

## 2023-11-30 LAB — PRO BRAIN NATRIURETIC PEPTIDE: Pro Brain Natriuretic Peptide: 496 pg/mL — ABNORMAL HIGH (ref <300.0)

## 2023-11-30 MED ORDER — PANTOPRAZOLE SODIUM 40 MG PO TBEC
40.0000 mg | DELAYED_RELEASE_TABLET | Freq: Every day | ORAL | Status: DC
  Administered 2023-12-01 – 2023-12-04 (×4): 40 mg via ORAL
  Filled 2023-11-30 (×4): qty 1

## 2023-11-30 MED ORDER — SODIUM CHLORIDE 0.9 % IV SOLN
500.0000 mg | Freq: Once | INTRAVENOUS | Status: AC
  Administered 2023-11-30: 500 mg via INTRAVENOUS
  Filled 2023-11-30: qty 5

## 2023-11-30 MED ORDER — DEXTROSE 5 % IV SOLN
250.0000 mg | INTRAVENOUS | Status: AC
  Administered 2023-12-01: 250 mg via INTRAVENOUS
  Filled 2023-11-30: qty 2.5

## 2023-11-30 MED ORDER — BISACODYL 10 MG RE SUPP: 10.0000 mg | Freq: Every day | RECTAL | Status: DC | PRN

## 2023-11-30 MED ORDER — SODIUM CHLORIDE 0.9 % IV SOLN
1.0000 g | Freq: Once | INTRAVENOUS | Status: AC
  Administered 2023-11-30: 1 g via INTRAVENOUS
  Filled 2023-11-30: qty 10

## 2023-11-30 MED ORDER — ONDANSETRON HCL 4 MG/2ML IJ SOLN: 4.0000 mg | Freq: Four times a day (QID) | INTRAMUSCULAR | Status: DC | PRN

## 2023-11-30 MED ORDER — SODIUM CHLORIDE 3 % IN NEBU
4.0000 mL | INHALATION_SOLUTION | Freq: Four times a day (QID) | RESPIRATORY_TRACT | Status: DC
  Administered 2023-11-30 – 2023-12-02 (×7): 4 mL via RESPIRATORY_TRACT
  Filled 2023-11-30 (×6): qty 4

## 2023-11-30 MED ORDER — SODIUM CHLORIDE 0.9 % IV SOLN
1.0000 g | INTRAVENOUS | Status: AC
  Administered 2023-12-01 – 2023-12-04 (×4): 1 g via INTRAVENOUS
  Filled 2023-11-30 (×4): qty 10

## 2023-11-30 MED ORDER — OXYCODONE HCL 5 MG PO TABS
5.0000 mg | ORAL_TABLET | ORAL | Status: DC | PRN
  Administered 2023-12-02 – 2023-12-04 (×5): 5 mg via ORAL
  Filled 2023-11-30 (×5): qty 1

## 2023-11-30 MED ORDER — ACETAMINOPHEN 325 MG PO TABS
650.0000 mg | ORAL_TABLET | Freq: Four times a day (QID) | ORAL | Status: DC | PRN
  Administered 2023-12-01 – 2023-12-04 (×3): 650 mg via ORAL
  Filled 2023-11-30 (×4): qty 2

## 2023-11-30 MED ORDER — GABAPENTIN 400 MG PO CAPS
400.0000 mg | ORAL_CAPSULE | Freq: Every day | ORAL | Status: DC
  Administered 2023-11-30 – 2023-12-03 (×4): 400 mg via ORAL
  Filled 2023-11-30 (×4): qty 1

## 2023-11-30 MED ORDER — ONDANSETRON HCL 4 MG PO TABS: 4.0000 mg | ORAL_TABLET | Freq: Four times a day (QID) | ORAL | Status: DC | PRN

## 2023-11-30 MED ORDER — ENOXAPARIN SODIUM 30 MG/0.3ML IJ SOSY
30.0000 mg | PREFILLED_SYRINGE | INTRAMUSCULAR | Status: DC
  Administered 2023-11-30 – 2023-12-03 (×4): 30 mg via SUBCUTANEOUS
  Filled 2023-11-30 (×4): qty 0.3

## 2023-11-30 MED ORDER — IPRATROPIUM-ALBUTEROL 0.5-2.5 (3) MG/3ML IN SOLN
3.0000 mL | Freq: Four times a day (QID) | RESPIRATORY_TRACT | Status: DC
  Administered 2023-11-30 – 2023-12-02 (×9): 3 mL via RESPIRATORY_TRACT
  Filled 2023-11-30 (×9): qty 3

## 2023-11-30 MED ORDER — SODIUM CHLORIDE 0.9% FLUSH
3.0000 mL | Freq: Two times a day (BID) | INTRAVENOUS | Status: DC
  Administered 2023-11-30 – 2023-12-04 (×7): 3 mL via INTRAVENOUS

## 2023-11-30 MED ORDER — SODIUM CHLORIDE 3 % IN NEBU
4.0000 mL | INHALATION_SOLUTION | Freq: Every day | RESPIRATORY_TRACT | Status: DC
  Filled 2023-11-30: qty 4

## 2023-11-30 MED ORDER — ACETAMINOPHEN 500 MG PO TABS: 1000.0000 mg | ORAL_TABLET | Freq: Three times a day (TID) | ORAL | Status: DC | PRN

## 2023-11-30 MED ORDER — ACETAMINOPHEN 650 MG RE SUPP: 650.0000 mg | Freq: Four times a day (QID) | RECTAL | Status: DC | PRN

## 2023-11-30 MED ORDER — GABAPENTIN 100 MG PO CAPS
200.0000 mg | ORAL_CAPSULE | Freq: Every day | ORAL | Status: DC
  Administered 2023-12-01 – 2023-12-04 (×4): 200 mg via ORAL
  Filled 2023-11-30 (×4): qty 2

## 2023-11-30 NOTE — ED Triage Notes (Signed)
 BIB EMS from home. C/o SOB for one month and persistent coughing.

## 2023-11-30 NOTE — ED Provider Notes (Signed)
 [place] EMERGENCY DEPARTMENT AT [facility] Provider Note   CSN: [id number] Arrival date & time: 11/30/23  1014     Patient presents with: Shortness of Breath   Suzanne Kim is a 83 y.o. female.    Shortness of Breath   Presents because of cough.  Cough for the past month.  Endorsing pain whenever she coughs.  Feels like she was not able to really get up everything that she is coughing up this morning and felt like she was choking little bit so came to ED for further evaluation.  Otherwise, no chest pain.  Patient is mostly bedbound in the setting of MS.  No history of DVT or PE.  No pleuritic chest pain or hemoptysis.  No leg swelling.  She does endorse the chest pain whenever she coughs.  Sick contacts.  No fever no chills.  No nausea vomit diarrhea.  Bowel moods been no regular.  She states that she does use albuterol at home at times.  She does not know the diagnosis as to why she uses albuterol at home.  She does state that she used to have asthma.   Previous medical history reviewed : Patient follow-up with PCP on November 29, 2023 because of persistent cough.  Previously prescribed Levaquin started on November 3.  Using albuterol inhaler 3 times daily.  Chest x-ray showed left lower lobe pleural effusion.      Prior to Admission medications   Medication Sig Start Date End Date Taking? Authorizing Provider  albuterol (PROVENTIL) (2.5 MG/3ML) 0.083% nebulizer solution Take 2.5 mg by nebulization daily. 10/13/20   [provider]  ferrous sulfate 325 (65 FE) MG EC tablet Take 325 mg by mouth daily with breakfast.    [provider]  gabapentin  (NEURONTIN ) 100 MG capsule May take 1 capsule in morning and in afternoon if needed. 07/19/21   Skeet Juliene SAUNDERS, DO  gabapentin  (NEURONTIN ) 300 MG capsule Take 1 capsule (300 mg total) by mouth at bedtime. 07/19/21   Skeet Juliene SAUNDERS, DO  metaxalone (SKELAXIN) 800 MG tablet Take 800 mg by mouth daily. 05/18/21    [provider]  Multiple Vitamin (MULTIVITAMIN WITH MINERALS) TABS tablet Take 1 tablet by mouth daily.    [provider]  oxyCODONE  (ROXICODONE ) 5 MG immediate release tablet Take 1 tablet (5 mg total) by mouth every 4 (four) hours as needed for severe pain. 02/03/21   Smoot, Lauraine LABOR, PA-C  VITAMIN D  PO Take 1 capsule by mouth daily.    [provider]  zinc sulfate 220 (50 Zn) MG capsule Take 220 mg by mouth daily.    [provider]    Allergies: Epinephrine    Review of Systems  Respiratory:  Positive for shortness of breath.     Updated Vital Signs BP 129/60   Pulse 88   Temp 98.3 F (36.8 C)   Resp 20   SpO2 96%   Physical Exam Vitals and nursing note reviewed.  Constitutional:      General: She is not in acute distress.    Appearance: She is well-developed.  HENT:     Head: Normocephalic and atraumatic.  Eyes:     Conjunctiva/sclera: Conjunctivae normal.  Cardiovascular:     Rate and Rhythm: Normal rate and regular rhythm.     Heart sounds: No murmur heard. Pulmonary:     Effort: Pulmonary effort is normal. No respiratory distress.     Breath sounds: Normal breath sounds.  Abdominal:     Palpations: Abdomen is soft.     Tenderness: There is no abdominal tenderness.  Musculoskeletal:        General: No swelling.     Cervical back: Neck supple.  Skin:    General: Skin is warm and dry.     Capillary Refill: Capillary refill takes less than 2 seconds.  Neurological:     Mental Status: She is alert.  Psychiatric:        Mood and Affect: Mood normal.     (all labs ordered are listed, but only abnormal results are displayed) Labs Reviewed  BASIC METABOLIC PANEL WITH GFR - Abnormal; Notable for the following components:      Result Value   Glucose, Bld 114 (*)    Creatinine, Ser 1.03 (*)    GFR, Estimated 54 (*)    All other components within normal limits  PRO BRAIN NATRIURETIC PEPTIDE - Abnormal; Notable for the  following components:   Pro Brain Natriuretic Peptide 496.0 (*)    All other components within normal limits  D-DIMER, QUANTITATIVE - Abnormal; Notable for the following components:   D-Dimer, Quant 0.69 (*)    All other components within normal limits  CBC WITH DIFFERENTIAL/PLATELET - Abnormal; Notable for the following components:   WBC 12.5 (*)    HCT 46.2 (*)    Platelets 126 (*)    Neutro Abs 10.3 (*)    Monocytes Absolute 1.3 (*)    All other components within normal limits  RESP PANEL BY RT-PCR (RSV, FLU A&B, COVID)  [id number]  RESPIRATORY PANEL BY PCR  MRSA NEXT GEN BY PCR, NASAL    EKG: EKG Interpretation Date/Time:  Thursday November 30 2023 11:07:00 EST Ventricular Rate:  89 PR Interval:  171 QRS Duration:  126 QT Interval:  340 QTC Calculation: 414 R Axis:   -17  Text Interpretation: Sinus rhythm Right bundle branch block Confirmed by Simon Rea [id number] on 11/30/2023 12:21:15 PM  Radiology: CT Chest Wo Contrast Result Date: 11/30/2023 EXAM: CT CHEST WITHOUT CONTRAST 11/30/2023 01:01:07 PM TECHNIQUE: CT of the chest was performed without the administration of intravenous contrast. Multiplanar reformatted images are provided for review. Automated exposure control, iterative reconstruction, and/or weight based adjustment of the mA/kV was utilized to reduce the radiation dose to as low as reasonably achievable. COMPARISON: Radiograph of same day. CLINICAL HISTORY: better eval atelectasis vs infiltrate better eval atelectasis vs infiltrate FINDINGS: MEDIASTINUM: Mild cardiomegaly. Large hiatal hernia. There does appear to be occlusion of the right mainstem bronchus due to aspiration or mucus plugging. LYMPH NODES: Calcified mediastinal lymph nodes are noted suggesting prior granulomatous disease. LUNGS AND PLEURA: Mild right apical and anterior pneumothorax is noted. Small right pleural effusion is noted. Right lower lobe atelectasis or infiltrate. Material is noted in the  posterior portion of right lung which may represent either calcification or aspirated material. SOFT TISSUES/BONES: No acute abnormality of the bones or soft tissues. UPPER ABDOMEN: Limited images of the upper abdomen demonstrates no acute abnormality. IMPRESSION: 1. Mild right apical and anterior pneumothorax. Attempts to call this finding to the ordering provider were unsuccessful as no one answered my phone calls to the emergency department . 2. Small right pleural effusion. 3. Right lower lobe atelectasis or infiltrate. 4. Occlusion of the right mainstem bronchus, possibly due to aspiration or mucus plugging. 5. Material in the posterior right lung, possibly representing calcification or aspirated material. 6. Calcified mediastinal lymph nodes, suggesting prior granulomatous disease. 7. Mild  cardiomegaly. 8. Large hiatal hernia. Electronically signed by: Lynwood Seip MD 11/30/2023 01:49 PM EST RP Workstation: [id number]   DG Chest Port 1 View Result Date: 11/30/2023 CLINICAL DATA:  Cough.  Evaluate for pneumonia. EXAM: PORTABLE CHEST 1 VIEW COMPARISON:  01/23/2018 CT 09/15/2016 FINDINGS: Patient is slightly rotated to the right. Lungs are hypoinflated with mild right basilar opacification which may be due to atelectasis or early infection. Remainder of the lungs are clear. Known moderate size hiatal hernia. Cardiomediastinal silhouette and remainder of the exam is unchanged. IMPRESSION: 1. Hypoinflation with mild right basilar opacification which may be due to atelectasis or early infection. 2. Known moderate size hiatal hernia. Electronically Signed   By: Toribio Agreste M.D.   On: 11/30/2023 11:33     Procedures   Medications Ordered in the ED  azithromycin (ZITHROMAX) 500 mg in sodium chloride  0.9 % 250 mL IVPB (has no administration in time range)  ipratropium-albuterol (DUONEB) 0.5-2.5 (3) MG/3ML nebulizer solution 3 mL (3 mLs Nebulization Given 11/30/23 1607)  sodium chloride  HYPERTONIC 3 %  nebulizer solution 4 mL (has no administration in time range)  cefTRIAXone (ROCEPHIN) 1 g in sodium chloride  0.9 % 100 mL IVPB (1 g Intravenous New Bag/Given 11/30/23 1415)                                    Medical Decision Making Amount and/or Complexity of Data Reviewed Labs: ordered. Radiology: ordered.     HPI:   Presents because of cough.  Cough for the past month.  Endorsing pain whenever she coughs.  Feels like she was not able to really get up everything that she is coughing up this morning and felt like she was choking little bit so came to ED for further evaluation.  Otherwise, no chest pain.  Patient is mostly bedbound in the setting of MS.  No history of DVT or PE.  No pleuritic chest pain or hemoptysis.  No leg swelling.  She does endorse the chest pain whenever she coughs.  Sick contacts.  No fever no chills.  No nausea vomit diarrhea.  Bowel moods been no regular.  She states that she does use albuterol at home at times.  She does not know the diagnosis as to why she uses albuterol at home.  She does state that she used to have asthma.   Previous medical history reviewed : Patient follow-up with PCP on November 29, 2023 because of persistent cough.  Previously prescribed Levaquin started on November 3.  Using albuterol inhaler 3 times daily.  Chest x-ray showed left lower lobe pleural effusion.  MDM:   Upon exam, patient ANO x 3 GCS 15.  No focal deficit.  On room air, saturation 94-95%.  Sounds were clear to oscillation bilaterally.  May have some upper respiratory congestion upon exam but no obvious rales or rhonchi or wheezing that I could appreciate.   Given the pain where she coughs, as well as for being bedbound, will obtain D-dimer.  Cannot rule out PE but think she is low risk.   Will obtain chest x-ray to rule out any, infiltrate concerning for pneumonia.  Obtain basic lab work.  Obtain lab work to assess for any large  electrolyte derangements and/or  leukocytosis.  Reevaluation:   Upon reexamination, patient hemodynamically stable.  Remains A&O x 3 with GCS 15.   Episode leukocytosis.  Slight thrombocytopenia.  No large electrolyte derangements.  X-ray concerning for possible opacification consistent for pneumonia.  I did obtain a D-dimer as well given cannot rule out PE.  Unremarkable for age cutoff.  Therefore, I did obtain CT chest without contrast to assess for any kind of evidence of pneumonia given the equivocal chest x-ray.  CT chest showed evidence of pneumonia.  Also showed pneumothorax on the right side.  No evidence to place stat chest tube given O2 saturation mid 90s on room air.  No respiratory distress.  I did place the patient on 6 L nasal cannula to help maybe close the pneumothorax.   Occlusion of the right mainstem bronchus as well to aspiration or mucous plugging.  Started patient on ceftriaxone and azithromycin to cover for commune acquired pneumonia.  Did not add on anaerobic coverage given no obvious abscess.  Pulmonology consult.  They came down to see the patient about whether or not this should put in a chest tube.  Do not feel like they should put in chest tube this point time given the location of the pneumothorax.  Recommends medical management.  Recommended supplemental oxygen support.   Patient will be admitted to medicine for further care.  Remained stable.     Interventions: ceftriaxone, azithromycin   EKG Interpreted by Me: sinus    Cardiac Tele Interpreted by Me: sinus    I have independently interpreted the CXR  and CT  images and agree with the radiologist finding   Social Determinant of Health: No alcohol/drugs    Disposition and Follow Up: admit  CRITICAL CARE Performed by: Lavonia LOISE Pat   Total critical care time: 44 minutes  Critical care time was exclusive of separately billable procedures and treating other patients.  Critical care was necessary to treat or prevent imminent  or life-threatening deterioration.  Critical care was time spent personally by me on the following activities: development of treatment plan with patient and/or surrogate as well as nursing, discussions with consultants, evaluation of patient's response to treatment, examination of patient, obtaining history from patient or surrogate, ordering and performing treatments and interventions, ordering and review of laboratory studies, ordering and review of radiographic studies, pulse oximetry and re-evaluation of patient's condition.       Final diagnoses:  Pneumonia of right lower lobe due to infectious organism  Primary spontaneous pneumothorax  Atelectasis    ED Discharge Orders     None          Pat Lavonia LOISE, MD 11/30/23 [phone]

## 2023-11-30 NOTE — Consult Note (Signed)
 NAME:  Suzanne Kim, MRN:  [id number], DOB:  10/06/1940, LOS: 0 ADMISSION DATE:  11/30/2023, CONSULTATION DATE:  11/30/2023 REFERRING MD:  ED, CHIEF COMPLAINT:  Anterior pneumothorax    History of Present Illness:  Suzanne Kim is a 83 y.o. with past medical history significant for hiatal hernia and multiple sclerosis who presented to the ED at [facility] for complaints of SOB with persistent cough.  Patient states cough has been present x 1 month.  When symptoms did not improve and she progressively became more symptomatic she presented to [facility] ED.  She denies any known sick contacts.  On ED arrival patient was standing hemodynamically stable with vital signs within normal limit. Lab work also relatively unremarkable apart from creatinine 1.63, GFR 54, BNP 496, WBC 12.5, and D-dimer 0.69.  CT chest obtained and revealed small right apical and anterior pneumothorax, small right pleural effusion, right lower lobe atelectasis, occlusion of the right mainstem bronchus felt likely secondary to aspiration or mucous plugging, material in the posterior right lung likely representing calcifications or aspirated material, and large hiatal hernia.  Given presence of pneumothorax PCCM was consulted for assistance in management.  Pertinent  Medical History  Hiatal hernia Multiple sclerosis  Significant Hospital Events: Including procedures, antibiotic start and stop dates in addition to other pertinent events   11/13 presented with complaints of shortness of breath and cough x 1 month workup revealed likely component of aspiration pneumonia to right lung with small apical pneumothorax to right.  Evaluated patient in the ED with ultrasound and no clear safe window seen for chest tube placement  Interim History / Subjective:  Seen sitting up on ED stretcher with no acute complaints other than persistent cough  Objective    Blood pressure 129/60, pulse 88, temperature 98.3 F (36.8 C), resp. rate  20, SpO2 99%.       No intake or output data in the 24 hours ending 11/30/23 1412 There were no vitals filed for this visit.  Examination: General: Acute on chronic deconditioned elderly female sitting up in ED stretcher no acute distress HEENT: [place]/AT, MM pink/moist, PERRL,  Neuro: Alert and oriented x 3, nonfocal, globally weak CV: s1s2 regular rate and rhythm, no murmur, rubs, or gallops,  PULM: Slightly diminished right side, no increased work of breathing on 2 L nasal cannula, mild cough GI: soft, bowel sounds active in all 4 quadrants, non-tender, non-distended Extremities: warm/dry, no edema  Skin: no rashes or lesions   Resolved problem list   Assessment and Plan  Acute hypoxic respiratory failure High suspicion for aspiration pneumonia Right apical/anterior pneumothorax Large hiatal hernia - CT chest obtained and revealed small right apical and anterior pneumothorax, small right pleural effusion, right lower lobe atelectasis, occlusion of the right mainstem bronchus felt likely secondary to aspiration or mucous plugging, material in the posterior right lung likely representing calcifications or aspirated material, and large hiatal hernia P: We evaluated patient's pleural space with ultrasound at bedside in ED and no safe window was identified Will need aggressive pulmonary hygiene Chest PT Hypertonic nebs High flow nasal cannula versus nonrebreather to help with reabsorption of pneumothorax Consider repeat CT versus bedside evaluation of pneumothorax tomorrow morning Check RVP panel Sputum culture if able to produce any sputum Aspiration precautions Empiric antibiotics   Labs   CBC: Recent Labs  Lab 11/30/23 1115  WBC 12.5*  NEUTROABS 10.3*  HGB 14.7  HCT 46.2*  MCV 94.9  PLT 126*    Basic  Metabolic Panel: Recent Labs  Lab 11/30/23 1115  NA 140  K 4.5  CL 104  CO2 27  GLUCOSE 114*  BUN 17  CREATININE 1.03*  CALCIUM 9.7   GFR: CrCl cannot be  calculated (Unknown ideal weight.). Recent Labs  Lab 11/30/23 1115  WBC 12.5*    Liver Function Tests: No results for input(s): AST, ALT, ALKPHOS, BILITOT, PROT, ALBUMIN in the last 168 hours. No results for input(s): LIPASE, AMYLASE in the last 168 hours. No results for input(s): AMMONIA in the last 168 hours.  ABG No results found for: PHART, PCO2ART, PO2ART, HCO3, TCO2, ACIDBASEDEF, O2SAT   Coagulation Profile: No results for input(s): INR, PROTIME in the last 168 hours.  Cardiac Enzymes: No results for input(s): CKTOTAL, CKMB, CKMBINDEX, TROPONINI in the last 168 hours.  HbA1C: No results found for: HGBA1C  CBG: No results for input(s): GLUCAP in the last 168 hours.  Review of Systems:   Please see the history of present illness. All other systems reviewed and are negative   Past Medical History:  She,  has a past medical history of Hiatal hernia and Multiple sclerosis (1988).   Surgical History:   Past Surgical History:  Procedure Laterality Date   BREAST ENHANCEMENT SURGERY  1975   ESOPHAGOGASTRODUODENOSCOPY (EGD) WITH PROPOFOL  N/A 05/12/2016   Procedure: ESOPHAGOGASTRODUODENOSCOPY (EGD) WITH PROPOFOL ;  Surgeon: Lamar Bunk, MD;  Location: [facility];  Service: Endoscopy;  Laterality: N/A;   ESOPHAGOGASTRODUODENOSCOPY (EGD) WITH PROPOFOL  N/A 01/23/2018   Procedure: ESOPHAGOGASTRODUODENOSCOPY (EGD) WITH PROPOFOL ;  Surgeon: Avram Lupita BRAVO, MD;  Location: [facility];  Service: Endoscopy;  Laterality: N/A;   TONSILLECTOMY     TOTAL VAGINAL HYSTERECTOMY     WISDOM TOOTH EXTRACTION       Social History:   reports that she has never smoked. She has never used smokeless tobacco. She reports that she does not drink alcohol and does not use drugs.   Family History:  Her family history includes Heart failure in her father. There is no history of Ataxia, Chorea, Dementia, Mental retardation, Migraines, Multiple  sclerosis, Neurofibromatosis, Neuropathy, Parkinsonism, Seizures, or Stroke.   Allergies Allergies  Allergen Reactions   Epinephrine Palpitations     Home Medications  Prior to Admission medications   Medication Sig Start Date End Date Taking? Authorizing Provider  albuterol (PROVENTIL) (2.5 MG/3ML) 0.083% nebulizer solution Take 2.5 mg by nebulization daily. 10/13/20   [provider]  ferrous sulfate 325 (65 FE) MG EC tablet Take 325 mg by mouth daily with breakfast.    [provider]  gabapentin  (NEURONTIN ) 100 MG capsule May take 1 capsule in morning and in afternoon if needed. 07/19/21   Skeet, Adam R, DO  gabapentin  (NEURONTIN ) 300 MG capsule Take 1 capsule (300 mg total) by mouth at bedtime. 07/19/21   Skeet Juliene SAUNDERS, DO  metaxalone (SKELAXIN) 800 MG tablet Take 800 mg by mouth daily. 05/18/21   [provider]  Multiple Vitamin (MULTIVITAMIN WITH MINERALS) TABS tablet Take 1 tablet by mouth daily.    [provider]  oxyCODONE  (ROXICODONE ) 5 MG immediate release tablet Take 1 tablet (5 mg total) by mouth every 4 (four) hours as needed for severe pain. 02/03/21   Smoot, Lauraine LABOR, PA-C  VITAMIN D  PO Take 1 capsule by mouth daily.    [provider]  zinc sulfate 220 (50 Zn) MG capsule Take 220 mg by mouth daily.    [provider]     Critical care time:  NA  Krishana Lutze D. Harris, NP-C [facility] Personal contact information can be found on Amion  If no contact or response made please call [phone] 11/30/2023, 3:19 PM

## 2023-11-30 NOTE — Progress Notes (Signed)
 Placed PT on nonrebreather mask at 15 LPM to help resolve pneumothorax- per CCM order. RN aware.

## 2023-11-30 NOTE — H&P (Addendum)
 History and Physical    Patient: Suzanne Kim FMW:[id number] DOB: February 11, 1940 DOA: 11/30/2023 DOS: the patient was seen and examined on 11/30/2023 PCP: [facility]  Patient coming from: Home  Chief Complaint:  Chief Complaint  Patient presents with   Shortness of Breath   HPI: Suzanne Kim is a 83 y.o. female with medical history significant for MS now bedbound Who asked her caregiver to call 911 this morning.  The patient has had trouble with a severe cough and some visible shortness of breath for a few weeks.  It is just not getting better.  No fevers are reported but apparently the patient felt more short of breath today so EMS was called.  The patient is bedbound and has advanced multiple sclerosis.  She is not on any medication.  She has 24-hour caregivers to come stay with her and help her. Per the ED note the patient did take a course of Levaquin which started November 3.  Her workup in the emergency department included a CT of the chest which revealed a mild right apical pneumothorax with right lower lobe infiltrate and small right pleural effusion.  The patient was COVID, flu, and RSV negative.  Critical care was called by the ED provider because of the pneumothorax.  She was evaluated but they could not get a chest tube into the area.  They feel that the pneumothorax is small enough that the no chest tube is required.  They are recommending that the hospitalist team admit the patient and they will follow in consultation.  Review of Systems: As mentioned in the history of present illness. All other systems reviewed and are negative. Past Medical History:  Diagnosis Date   Hiatal hernia    Multiple sclerosis 1988   Past Surgical History:  Procedure Laterality Date   BREAST ENHANCEMENT SURGERY  1975   ESOPHAGOGASTRODUODENOSCOPY (EGD) WITH PROPOFOL  N/A 05/12/2016   Procedure: ESOPHAGOGASTRODUODENOSCOPY (EGD) WITH PROPOFOL ;  Surgeon: Lamar Bunk, MD;   Location: [facility];  Service: Endoscopy;  Laterality: N/A;   ESOPHAGOGASTRODUODENOSCOPY (EGD) WITH PROPOFOL  N/A 01/23/2018   Procedure: ESOPHAGOGASTRODUODENOSCOPY (EGD) WITH PROPOFOL ;  Surgeon: Avram Lupita BRAVO, MD;  Location: [facility];  Service: Endoscopy;  Laterality: N/A;   TONSILLECTOMY     TOTAL VAGINAL HYSTERECTOMY     WISDOM TOOTH EXTRACTION     Social History:  reports that she has never smoked. She has never used smokeless tobacco. She reports that she does not drink alcohol and does not use drugs.  Allergies  Allergen Reactions   Epinephrine Palpitations    Family History  Problem Relation Age of Onset   Heart failure Father    Ataxia Neg Hx    Chorea Neg Hx    Dementia Neg Hx    Mental retardation Neg Hx    Migraines Neg Hx    Multiple sclerosis Neg Hx    Neurofibromatosis Neg Hx    Neuropathy Neg Hx    Parkinsonism Neg Hx    Seizures Neg Hx    Stroke Neg Hx     Prior to Admission medications   Medication Sig Start Date End Date Taking? Authorizing Provider  ferrous sulfate 325 (65 FE) MG EC tablet Take 325 mg by mouth daily with breakfast.   Yes [provider]  gabapentin  (NEURONTIN ) 400 MG capsule Take 400 mg by mouth 3 (three) times daily.   Yes [provider]  metaxalone (SKELAXIN) 800 MG tablet Take 800 mg by mouth in the morning  and at bedtime. 05/18/21  Yes [provider]  omeprazole  (PRILOSEC) 20 MG capsule Take 20 mg by mouth See admin instructions. Take 20 mg by mouth 30 minutes before breakfast   Yes [provider]  albuterol (PROVENTIL) (2.5 MG/3ML) 0.083% nebulizer solution Take 2.5 mg by nebulization daily. 10/13/20   [provider]  gabapentin  (NEURONTIN ) 100 MG capsule May take 1 capsule in morning and in afternoon if needed. 07/19/21   Skeet, Adam R, DO  gabapentin  (NEURONTIN ) 300 MG capsule Take 1 capsule (300 mg total) by mouth at bedtime. Patient not taking: Reported on 11/30/2023 07/19/21   Skeet Juliene SAUNDERS, DO  Multiple Vitamin (MULTIVITAMIN WITH MINERALS) TABS tablet Take 1 tablet by mouth daily.    [provider]  oxyCODONE  (ROXICODONE ) 5 MG immediate release tablet Take 1 tablet (5 mg total) by mouth every 4 (four) hours as needed for severe pain. 02/03/21   Smoot, Lauraine LABOR, PA-C  VITAMIN D  PO Take 1 capsule by mouth daily.    [provider]  zinc sulfate 220 (50 Zn) MG capsule Take 220 mg by mouth daily.    [provider]    Physical Exam: Vitals:   11/30/23 1035 11/30/23 1323 11/30/23 1608  BP: (!) 109/57 129/60   Pulse: 92 88   Resp: 16 20   Temp: 98.4 F (36.9 C) 98.3 F (36.8 C)   SpO2: 94% 99% 96%   Physical Exam:  General: frail woman with face mask on, she does appear slightly tachypneic HEENT: Normocephalic, atraumatic, PERRL Cardiovascular: Normal rate and rhythm. Distal pulses intact. Pulmonary: Normal pulmonary effort, diminshed breath sounds Gastrointestinal: Distended abdomen, soft, non-tender, hypoactive bowel sounds Musculoskeletal:Normal ROM, no lower ext edema Contractured right hand.  Foot drop bilaterally. Decreased muscle bulk Lymphadenopathy: No cervical LAD. Skin: Skin is warm and dry. Neuro: Diffuse weakness.  AAO. PSYCH: Attentive and cooperative and appropriate  Data Reviewed:  Results for orders placed or performed during the hospital encounter of 11/30/23 (from the past 24 hours)  Basic metabolic panel     Status: Abnormal   Collection Time: 11/30/23 11:15 AM  Result Value Ref Range   Sodium 140 135 - 145 mmol/L   Potassium 4.5 3.5 - 5.1 mmol/L   Chloride 104 98 - 111 mmol/L   CO2 27 22 - 32 mmol/L   Glucose, Bld 114 (H) 70 - 99 mg/dL   BUN 17 8 - 23 mg/dL   Creatinine, Ser 8.96 (H) 0.44 - 1.00 mg/dL   Calcium 9.7 8.9 - 89.6 mg/dL   GFR, Estimated 54 (L) >60 mL/min   Anion gap 10 5 - 15  Pro Brain natriuretic peptide     Status: Abnormal   Collection Time: 11/30/23 11:15 AM  Result Value Ref Range    Pro Brain Natriuretic Peptide 496.0 (H) <300.0 pg/mL  D-dimer, quantitative     Status: Abnormal   Collection Time: 11/30/23 11:15 AM  Result Value Ref Range   D-Dimer, Quant 0.69 (H) 0.00 - 0.50 ug/mL-FEU  CBC with Differential     Status: Abnormal   Collection Time: 11/30/23 11:15 AM  Result Value Ref Range   WBC 12.5 (H) 4.0 - 10.5 K/uL   RBC 4.87 3.87 - 5.11 MIL/uL   Hemoglobin 14.7 12.0 - 15.0 g/dL   HCT 53.7 (H) 63.9 - 53.9 %   MCV 94.9 80.0 - 100.0 fL   MCH 30.2 26.0 - 34.0 pg   MCHC 31.8 30.0 - 36.0 g/dL  RDW 12.9 11.5 - 15.5 %   Platelets 126 (L) 150 - 400 K/uL   nRBC 0.0 0.0 - 0.2 %   Neutrophils Relative % 82 %   Neutro Abs 10.3 (H) 1.7 - 7.7 K/uL   Lymphocytes Relative 7 %   Lymphs Abs 0.8 0.7 - 4.0 K/uL   Monocytes Relative 10 %   Monocytes Absolute 1.3 (H) 0.1 - 1.0 K/uL   Eosinophils Relative 1 %   Eosinophils Absolute 0.1 0.0 - 0.5 K/uL   Basophils Relative 0 %   Basophils Absolute 0.0 0.0 - 0.1 K/uL   Immature Granulocytes 0 %   Abs Immature Granulocytes 0.05 0.00 - 0.07 K/uL  Resp panel by RT-PCR (RSV, Flu A&B, Covid) Anterior Nasal Swab     Status: None   Collection Time: 11/30/23 12:29 PM   Specimen: Anterior Nasal Swab  Result Value Ref Range   SARS Coronavirus 2 by RT PCR NEGATIVE NEGATIVE   Influenza A by PCR NEGATIVE NEGATIVE   Influenza B by PCR NEGATIVE NEGATIVE   Resp Syncytial Virus by PCR NEGATIVE NEGATIVE    CT Chest IMPRESSION: 1. Mild right apical and anterior pneumothorax. Attempts to call this finding to the ordering provider were unsuccessful as no one answered my phone calls to the emergency department . 2. Small right pleural effusion. 3. Right lower lobe atelectasis or infiltrate. 4. Occlusion of the right mainstem bronchus, possibly due to aspiration or mucus plugging. 5. Material in the posterior right lung, possibly representing calcification or aspirated material. 6. Calcified mediastinal lymph nodes, suggesting prior  granulomatous disease. 7. Mild cardiomegaly. 8. Large hiatal hernia.    Assessment and Plan: Pneumonia Small right apical pneumothorax Multiple sclerosis - Appreciate PCCM management - IV Rocephin and Zithromax - Oxygen as needed - PCCM has ordered saline nebs for her cough and mucous plugging - Nonambulatory at baseline - Resume routine medications - Speech eval ordered as patient reported choking with food intake.   Advance Care Planning:   Code Status: Prior the patient names her caregiver/neighbor Suzanne Kim as her runner, broadcasting/film/video.  She wants to be DNR.  She has an out of facility DNR form which was brought with EMS.  Consults: Pulmonology  Family Communication: I did speak to the patient's stepdaughter who lives in [place] on the phone.  Severity of Illness: The appropriate patient status for this patient is INPATIENT. Inpatient status is judged to be reasonable and necessary in order to provide the required intensity of service to ensure the patient's safety. The patient's presenting symptoms, physical exam findings, and initial radiographic and laboratory data in the context of their chronic comorbidities is felt to place them at high risk for further clinical deterioration. Furthermore, it is not anticipated that the patient will be medically stable for discharge from the hospital within 2 midnights of admission.   * I certify that at the point of admission it is my clinical judgment that the patient will require inpatient hospital care spanning beyond 2 midnights from the point of admission due to high intensity of service, high risk for further deterioration and high frequency of surveillance required.*  Author: ARTHEA CHILD, MD 11/30/2023 4:37 PM  For on call review [email or web address].

## 2023-12-01 ENCOUNTER — Inpatient Hospital Stay (HOSPITAL_COMMUNITY)

## 2023-12-01 ENCOUNTER — Other Ambulatory Visit: Payer: Self-pay

## 2023-12-01 ENCOUNTER — Encounter (HOSPITAL_COMMUNITY): Payer: Self-pay | Admitting: Internal Medicine

## 2023-12-01 DIAGNOSIS — J939 Pneumothorax, unspecified: Secondary | ICD-10-CM

## 2023-12-01 DIAGNOSIS — J9811 Atelectasis: Secondary | ICD-10-CM

## 2023-12-01 DIAGNOSIS — J189 Pneumonia, unspecified organism: Principal | ICD-10-CM

## 2023-12-01 LAB — CBC
HCT: 42.1 % (ref 36.0–46.0)
Hemoglobin: 13.4 g/dL (ref 12.0–15.0)
MCH: 29.6 pg (ref 26.0–34.0)
MCHC: 31.8 g/dL (ref 30.0–36.0)
MCV: 93.1 fL (ref 80.0–100.0)
Platelets: 91 K/uL — ABNORMAL LOW (ref 150–400)
RBC: 4.52 MIL/uL (ref 3.87–5.11)
RDW: 12.9 % (ref 11.5–15.5)
WBC: 7.3 K/uL (ref 4.0–10.5)
nRBC: 0 % (ref 0.0–0.2)

## 2023-12-01 LAB — COMPREHENSIVE METABOLIC PANEL WITH GFR
ALT: 11 U/L (ref 0–44)
AST: 19 U/L (ref 15–41)
Albumin: 3.4 g/dL — ABNORMAL LOW (ref 3.5–5.0)
Alkaline Phosphatase: 76 U/L (ref 38–126)
Anion gap: 10 (ref 5–15)
BUN: 22 mg/dL (ref 8–23)
CO2: 26 mmol/L (ref 22–32)
Calcium: 9.5 mg/dL (ref 8.9–10.3)
Chloride: 106 mmol/L (ref 98–111)
Creatinine, Ser: 0.9 mg/dL (ref 0.44–1.00)
GFR, Estimated: >60 mL/min (ref >60)
Glucose, Bld: 97 mg/dL (ref 70–99)
Potassium: 3.9 mmol/L (ref 3.5–5.1)
Sodium: 142 mmol/L (ref 135–145)
Total Bilirubin: 0.4 mg/dL (ref 0.0–1.2)
Total Protein: 6.2 g/dL — ABNORMAL LOW (ref 6.5–8.1)

## 2023-12-01 MED ORDER — SODIUM CHLORIDE 0.9 % IV SOLN
500.0000 mg | Freq: Every day | INTRAVENOUS | Status: DC
  Administered 2023-12-02 – 2023-12-03 (×2): 500 mg via INTRAVENOUS
  Filled 2023-12-01 (×3): qty 5

## 2023-12-01 MED ORDER — METAXALONE 800 MG PO TABS
800.0000 mg | ORAL_TABLET | Freq: Two times a day (BID) | ORAL | Status: DC
  Administered 2023-12-01 – 2023-12-04 (×6): 800 mg via ORAL
  Filled 2023-12-01 (×7): qty 1

## 2023-12-01 NOTE — Evaluation (Signed)
 Clinical/Bedside Swallow Evaluation Patient Details  Name: Suzanne Kim MRN: [id number] Date of Birth: 1940/12/03  Today's Date: 12/01/2023 Time: SLP Start Time (ACUTE ONLY): 0950 SLP Stop Time (ACUTE ONLY): 1005 SLP Time Calculation (min) (ACUTE ONLY): 15 min  Past Medical History:  Past Medical History:  Diagnosis Date   Hiatal hernia    Multiple sclerosis 1988   Past Surgical History:  Past Surgical History:  Procedure Laterality Date   BREAST ENHANCEMENT SURGERY  1975   ESOPHAGOGASTRODUODENOSCOPY (EGD) WITH PROPOFOL  N/A 05/12/2016   Procedure: ESOPHAGOGASTRODUODENOSCOPY (EGD) WITH PROPOFOL ;  Surgeon: Suzanne Bunk, MD;  Location: [facility];  Service: Endoscopy;  Laterality: N/A;   ESOPHAGOGASTRODUODENOSCOPY (EGD) WITH PROPOFOL  N/A 01/23/2018   Procedure: ESOPHAGOGASTRODUODENOSCOPY (EGD) WITH PROPOFOL ;  Surgeon: Suzanne Lupita BRAVO, MD;  Location: [facility];  Service: Endoscopy;  Laterality: N/A;   TONSILLECTOMY     TOTAL VAGINAL HYSTERECTOMY     WISDOM TOOTH EXTRACTION     HPI:  83 year old female with advanced MS, bedbound, admitted with PNA. CT of the chest which revealed a mild right apical pneumothorax with right lower lobe infiltrate and small right pleural effusion.  The patient was COVID, flu, and RSV negative.    Assessment / Plan / Recommendation  Clinical Impression  Patient presents with a functional oropharyngeal swallow characterized by efficient mastication and oral transit of bolus, swift appearing swallow initiation and no overt indication of aspiration during po trials. She does however complain of food often just not going down specifically an episode of this with a solid po just prior to coming into the hospital, exacerbating respiratory symptoms. Additionally, coughing noted following po intake which caregiver reports occurs frequently. In light of above as well as h/o MS and acute right sided PNA, recommend instrumental testing to evaluate swallowin  physiology and determine least restrictive diet. Will plan for MBS today at 1200. SLP Visit Diagnosis: Dysphagia, unspecified (R13.10)    Aspiration Risk       Diet Recommendation Thin liquid (full liqiuds as ordered by MD)    Liquid Administration via: Cup;Straw Medication Administration: Whole meds with puree Supervision: Staff to assist with self feeding;Full supervision/cueing for compensatory strategies Compensations: Slow rate;Small sips/bites Postural Changes: Seated upright at 90 degrees    Other  Recommendations Oral Care Recommendations: Oral care BID      Swallow Study   General HPI: 83 year old female with advanced MS, bedbound, admitted with PNA. CT of the chest which revealed a mild right apical pneumothorax with right lower lobe infiltrate and small right pleural effusion.  The patient was COVID, flu, and RSV negative. Type of Study: Bedside Swallow Evaluation Previous Swallow Assessment: none Diet Prior to this Study: Thin liquids (Level 0) (full liquids) Temperature Spikes Noted: Yes Respiratory Status: Room air (just off HFNC) History of Recent Intubation: No Behavior/Cognition: Alert;Pleasant mood;Cooperative Oral Cavity Assessment: Within Functional Limits Oral Care Completed by SLP: No Oral Cavity - Dentition: Adequate natural dentition Vision: Functional for self-feeding Self-Feeding Abilities: Needs assist Patient Positioning: Upright in chair Baseline Vocal Quality: Normal Volitional Cough: Strong;Congested Volitional Swallow: Able to elicit    Oral/Motor/Sensory Function Overall Oral Motor/Sensory Function: Within functional limits   Ice Chips Ice chips: Not tested   Thin Liquid Thin Liquid: Within functional limits Presentation: Cup;Self Fed;Straw    Nectar Thick Nectar Thick Liquid: Not tested   Honey Thick Honey Thick Liquid: Not tested   Puree Puree: Within functional limits Presentation: Spoon   Solid  Solid: Within functional limits      Suzanne Schliep MA, CCC-SLP  Suzanne Kim Suzanne Kim 12/01/2023,10:11 AM

## 2023-12-01 NOTE — Progress Notes (Signed)
 Modified Barium Swallow Study  Patient Details  Name: Suzanne Kim MRN: [id number] Date of Birth: September 07, 1940  Today's Date: 12/01/2023  Modified Barium Swallow completed.  Full report located under Chart Review in the Imaging Section.  History of Present Illness 83 year old female with advanced MS, bedbound, admitted with PNA. CT of the chest which revealed a mild right apical pneumothorax with right lower lobe infiltrate and small right pleural effusion.  The patient was COVID, flu, and RSV negative.   Clinical Impression Patient presents with a mild pharyngeal phase dysphagia characterized by decreased laryngeal closure with resultant trace and intermittent penetration of thin liquids. View below the vocal cords difficulty due to shadows from shoulders/posture although penetration did not appear to reach below the level of the vocal cords, often remaining above and lifting with cued throat clears and subsequent swallows. Pharyngeal clearance normal, no significant esophageal deficits noted although cannot r/o during today's study, particularly in light of c/o intermittent globus with solids. Defer possible esophagram to MD if feel necessary. SLP will f/u briefly at bedside to reinforce safe swallowing strategies. Factors that may increase risk of adverse event in presence of aspiration Noe & Lianne 2021): Limited mobility;Frail or deconditioned  Swallow Evaluation Recommendations Recommendations: PO diet PO Diet Recommendation: Regular;Thin liquids (Level 0) Liquid Administration via: Cup;Straw Medication Administration: Whole meds with liquid Supervision: Staff to assist with self-feeding Swallowing strategies  : Slow rate;Small bites/sips;Clear throat intermittently Postural changes: Position pt fully upright for meals Oral care recommendations: Oral care BID (2x/day)    Briselda Naval MA, CCC-SLP   Suzanne Kim 12/01/2023,12:57 PM

## 2023-12-01 NOTE — TOC Initial Note (Signed)
 Transition of Care [facility]) - Initial/Assessment Note    Patient Details  Name: Suzanne Kim MRN: [id number] Date of Birth: 1940-04-30  [facility] CM/SW Contact:    Bascom Service, RN Phone Number: 12/01/2023, 2:15 PM  Clinical Narrative: Bedbound, MS.spoke to [name](neighbor over 40 years) d/c plan return home w/24/7 caregivers, has dme-w/c;hoyer lift,w/c. DNR.PTAR @ d/c-confirmed address.                 Expected Discharge Plan: Home/Self Care Barriers to Discharge: Continued Medical Work up   Patient Goals and CMS Choice Patient states their goals for this hospitalization and ongoing recovery are:: Home CMS Medicare.gov Compare Post Acute Care list provided to:: Patient Represenative (must comment) ([name](neighbor over 4o years)) Choice offered to / list presented to :  (neighbor [name])      Expected Discharge Plan and Services   Discharge Planning Services: CM Consult Post Acute Care Choice: Resumption of Svcs/PTA Provider Living arrangements for the past 2 months: Single Family Home                                      Prior Living Arrangements/Services Living arrangements for the past 2 months: Single Family Home Lives with:: Self              Current home services: DME, Other (comment) (w/c,hoyer lift,w/c;24hr/7days caregivers)    Activities of Daily Living   ADL Screening (condition at time of admission) Independently performs ADLs?: No Does the patient have a NEW difficulty with bathing/dressing/toileting/self-feeding that is expected to last >3 days?: No Does the patient have a NEW difficulty with getting in/out of bed, walking, or climbing stairs that is expected to last >3 days?: No Does the patient have a NEW difficulty with communication that is expected to last >3 days?: No Is the patient deaf or have difficulty hearing?: No Does the patient have difficulty seeing, even when wearing glasses/contacts?: No Does the patient  have difficulty concentrating, remembering, or making decisions?: No  Permission Sought/Granted                  Emotional Assessment              Admission diagnosis:  Atelectasis [J98.11] Primary spontaneous pneumothorax [J93.11] Pneumothorax [J93.9] Pneumonia of right lower lobe due to infectious organism [J18.9] Patient Active Problem List   Diagnosis Date Noted   Pneumonia of right lower lobe due to infectious organism 12/01/2023   Atelectasis 12/01/2023   Pneumothorax 11/30/2023   Heme + stool    Acute posthemorrhagic anemia 01/22/2018   Symptomatic anemia 01/22/2018   Rhabdomyolysis 01/22/2018   Thrombocytopenia 01/22/2018   Osteoporosis 01/22/2018   GIB (gastrointestinal bleeding) 05/11/2016   Muscle spasm of right lower extremity 10/14/2013   Overactive bladder 10/14/2013   Hip pain, bilateral 10/14/2013   Bilateral low back pain without sciatica 10/14/2013   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   PCP:  [facility] Pharmacy:   [id number] [place], [place] - [address] AT [facility] [address] & [facility] [place] [address] Phone: [phone] Fax: [phone]     Social Drivers of Health (SDOH) Social History: SDOH Screenings   Food Insecurity: No Food Insecurity (11/30/2023)  Housing: Low Risk  (11/30/2023)  Transportation Needs: No Transportation Needs (11/30/2023)  Utilities: Not At Risk (11/30/2023)  Social Connections: Unknown (11/30/2023)  Tobacco Use: Low Risk  (06/15/2023)   SDOH Interventions:     Readmission Risk Interventions     No data to display

## 2023-12-01 NOTE — Progress Notes (Signed)
 PROGRESS NOTE    Suzanne Kim  FMW:[id number] DOB: 17-Nov-1940 DOA: 11/30/2023 PCP: [facility]   Brief Narrative:  83 year old female with history of multiple sclerosis, now bedbound presented with worsening shortness of breath and cough.  On presentation, CT of the chest showed a mild right apical pneumothorax with right lower lobe infiltrate and small right pleural effusion.  COVID/influenza/RSV PCR negative.  Pulmonary was consulted.  She was started on IV antibiotics.  Assessment & Plan:   Community-acquired bacterial right lower lobar pneumonia: Bacterial unspecified Acute respiratory failure with hypoxia -Imaging as above.  Currently on nonrebreather oxygen.  Continue Rocephin and Zithromax.  Continue nebs - Respiratory panel PCR negative. - SLP eval  Small right apical pneumothorax -Pulmonary following.  No need for chest tube at this time.  Continue nonrebreather for now.  Multiple sclerosis, now bedbound - Fall precautions.  Leukocytosis - No labs today.  Monitor  Thrombocytopenia -Mild.  No labs today.  Monitor intermittently.  No signs of bleeding.  Obesity class III - Outpatient follow-up   DVT prophylaxis: Lovenox Code Status: DNR Family Communication: None at bedside Disposition Plan: Status is: Inpatient Remains inpatient appropriate because: Of severity of illness    Consultants: PCCM  Procedures: None  Antimicrobials: Rocephin and Zithromax   Subjective: Patient seen and examined at bedside.  Still short of breath with mild exertion but feels slightly better.  No fever, vomiting, abdominal reported.  Objective: Vitals:   11/30/23 2034 11/30/23 2041 11/30/23 2104 12/01/23 0409  BP: (!) 115/55   (!) 119/39  Pulse: (!) 55   79  Resp:      Temp: 98.1 F (36.7 C)   98.3 F (36.8 C)  TempSrc: Oral   Oral  SpO2: (!) 83% 100% 100% 100%  Weight:      Height:       No intake or output data in the 24 hours ending 12/01/23  0658 Filed Weights   11/30/23 1900  Weight: (!) 137 kg    Examination:  General exam: Appears calm and comfortable.  Elderly female lying in bed. Respiratory system: Bilateral decreased breath sounds at bases with scattered crackles Cardiovascular system: S1 & S2 heard, Rate controlled Gastrointestinal system: Abdomen is obese, nondistended, soft and nontender. Normal bowel sounds heard. Extremities: No cyanosis, clubbing, edema  Central nervous system: Alert and oriented.  Slow to respond.   Skin: No rashes, lesions or ulcers Psychiatry: Flat affect.  Not agitated   Data Reviewed: I have personally reviewed following labs and imaging studies  CBC: Recent Labs  Lab 11/30/23 1115  WBC 12.5*  NEUTROABS 10.3*  HGB 14.7  HCT 46.2*  MCV 94.9  PLT 126*   Basic Metabolic Panel: Recent Labs  Lab 11/30/23 1115 12/01/23 0546  NA 140 142  K 4.5 3.9  CL 104 106  CO2 27 26  GLUCOSE 114* 97  BUN 17 22  CREATININE 1.03* 0.90  CALCIUM 9.7 9.5   GFR: Estimated Creatinine Clearance: 69.8 mL/min (by C-G formula based on SCr of 0.9 mg/dL). Liver Function Tests: Recent Labs  Lab 12/01/23 0546  AST 19  ALT 11  ALKPHOS 76  BILITOT 0.4  PROT 6.2*  ALBUMIN 3.4*   No results for input(s): LIPASE, AMYLASE in the last 168 hours. No results for input(s): AMMONIA in the last 168 hours. Coagulation Profile: No results for input(s): INR, PROTIME in the last 168 hours. Cardiac Enzymes: No results for input(s): CKTOTAL, CKMB, CKMBINDEX, TROPONINI in the last 168  hours. BNP (last 3 results) Recent Labs    11/30/23 1115  PROBNP 496.0*   HbA1C: No results for input(s): HGBA1C in the last 72 hours. CBG: No results for input(s): GLUCAP in the last 168 hours. Lipid Profile: No results for input(s): CHOL, HDL, LDLCALC, TRIG, CHOLHDL, LDLDIRECT in the last 72 hours. Thyroid  Function Tests: No results for input(s): TSH, T4TOTAL, FREET4,  T3FREE, THYROIDAB in the last 72 hours. Anemia Panel: No results for input(s): VITAMINB12, FOLATE, FERRITIN, TIBC, IRON, RETICCTPCT in the last 72 hours. Sepsis Labs: No results for input(s): PROCALCITON, LATICACIDVEN in the last 168 hours.  Recent Results (from the past 240 hours)  Resp panel by RT-PCR (RSV, Flu A&B, Covid) Anterior Nasal Swab     Status: None   Collection Time: 11/30/23 12:29 PM   Specimen: Anterior Nasal Swab  Result Value Ref Range Status   SARS Coronavirus 2 by RT PCR NEGATIVE NEGATIVE Final    Comment: (NOTE) SARS-CoV-2 target nucleic acids are NOT DETECTED.  The SARS-CoV-2 RNA is generally detectable in upper respiratory specimens during the acute phase of infection. The lowest concentration of SARS-CoV-2 viral copies this assay can detect is 138 copies/mL. A negative result does not preclude SARS-Cov-2 infection and should not be used as the sole basis for treatment or other patient management decisions. A negative result may occur with  improper specimen collection/handling, submission of specimen other than nasopharyngeal swab, presence of viral mutation(s) within the areas targeted by this assay, and inadequate number of viral copies(<138 copies/mL). A negative result must be combined with clinical observations, patient history, and epidemiological information. The expected result is Negative.  Fact Sheet for Patients:  bloggercourse.com  Fact Sheet for Healthcare Providers:  seriousbroker.it  This test is no t yet approved or cleared by the [place]  FDA and  has been authorized for detection and/or diagnosis of SARS-CoV-2 by FDA under an Emergency Use Authorization (EUA). This EUA will remain  in effect (meaning this test can be used) for the duration of the COVID-19 declaration under Section 564(b)(1) of the Act, 21 U.S.C.section 360bbb-3(b)(1), unless the authorization is  terminated  or revoked sooner.       Influenza A by PCR NEGATIVE NEGATIVE Final   Influenza B by PCR NEGATIVE NEGATIVE Final    Comment: (NOTE) The Xpert Xpress SARS-CoV-2/FLU/RSV plus assay is intended as an aid in the diagnosis of influenza from Nasopharyngeal swab specimens and should not be used as a sole basis for treatment. Nasal washings and aspirates are unacceptable for Xpert Xpress SARS-CoV-2/FLU/RSV testing.  Fact Sheet for Patients: bloggercourse.com  Fact Sheet for Healthcare Providers: seriousbroker.it  This test is not yet approved or cleared by the [place]  FDA and has been authorized for detection and/or diagnosis of SARS-CoV-2 by FDA under an Emergency Use Authorization (EUA). This EUA will remain in effect (meaning this test can be used) for the duration of the COVID-19 declaration under Section 564(b)(1) of the Act, 21 U.S.C. section 360bbb-3(b)(1), unless the authorization is terminated or revoked.     Resp Syncytial Virus by PCR NEGATIVE NEGATIVE Final    Comment: (NOTE) Fact Sheet for Patients: bloggercourse.com  Fact Sheet for Healthcare Providers: seriousbroker.it  This test is not yet approved or cleared by the [place]  FDA and has been authorized for detection and/or diagnosis of SARS-CoV-2 by FDA under an Emergency Use Authorization (EUA). This EUA will remain in effect (meaning this test can be used) for the duration of the COVID-19 declaration under  Section 564(b)(1) of the Act, 21 U.S.C. section 360bbb-3(b)(1), unless the authorization is terminated or revoked.  Performed at [facility], [address], [place], [place] [address]   Respiratory (~20 pathogens) panel by PCR     Status: None   Collection Time: 11/30/23 12:29 PM   Specimen: Nasopharyngeal Swab; Respiratory  Result Value Ref Range Status    Adenovirus NOT DETECTED NOT DETECTED Final   Coronavirus 229E NOT DETECTED NOT DETECTED Final    Comment: (NOTE) The Coronavirus on the Respiratory Panel, DOES NOT test for the novel  Coronavirus (2019 nCoV)    Coronavirus HKU1 NOT DETECTED NOT DETECTED Final   Coronavirus NL63 NOT DETECTED NOT DETECTED Final   Coronavirus OC43 NOT DETECTED NOT DETECTED Final   Metapneumovirus NOT DETECTED NOT DETECTED Final   Rhinovirus / Enterovirus NOT DETECTED NOT DETECTED Final   Influenza A NOT DETECTED NOT DETECTED Final   Influenza B NOT DETECTED NOT DETECTED Final   Parainfluenza Virus 1 NOT DETECTED NOT DETECTED Final   Parainfluenza Virus 2 NOT DETECTED NOT DETECTED Final   Parainfluenza Virus 3 NOT DETECTED NOT DETECTED Final   Parainfluenza Virus 4 NOT DETECTED NOT DETECTED Final   Respiratory Syncytial Virus NOT DETECTED NOT DETECTED Final   Bordetella pertussis NOT DETECTED NOT DETECTED Final   Bordetella Parapertussis NOT DETECTED NOT DETECTED Final   Chlamydophila pneumoniae NOT DETECTED NOT DETECTED Final   Mycoplasma pneumoniae NOT DETECTED NOT DETECTED Final    Comment: Performed at [facility], [address], [place], [place] [address]         Radiology Studies: DG CHEST PORT 1 VIEW Result Date: 12/01/2023 EXAM: 1 VIEW(S) XRAY OF THE CHEST 12/01/2023 05:32:00 AM COMPARISON: Chest CT yesterday at 12:45 pm. CLINICAL HISTORY: Pneumothorax. FINDINGS: LUNGS AND PLEURA: Small right apical pneumothorax estimated at 5% or less of the chest volume although possibly underestimated AP. This is not notably changed from yesterday's portable chest. Small right pleural effusion. The lungs are expiratory. There is dense consolidation in the right lower lobe with calcifications in the consolidated lung. Coarse atelectatic change noted in the left lower lung field. Remaining lungs are generally clear. Overall aeration seems unchanged. HEART AND MEDIASTINUM: Mild cardiomegaly. Stable  mediastinum with aortic atherosclerosis. Multifocal calcified mediastinal and hilar lymph nodes. BONES AND SOFT TISSUES: No acute osseous abnormality. Large hiatal hernia. IMPRESSION: 1. Small right apical pneumothorax, estimated at 5% or less of chest volume, stable compared to yesterdays exam. 2. Dense consolidation in the right lower lobe with internal calcifications in the consolidated lung. 3. Small right pleural effusion. 4. Coarse atelectatic change in the left lower lung field. 5. Large hiatal hernia. Electronically signed by: Francis Quam MD 12/01/2023 05:49 AM EST RP Workstation: [id number]   CT Chest Wo Contrast Result Date: 11/30/2023 EXAM: CT CHEST WITHOUT CONTRAST 11/30/2023 01:01:07 PM TECHNIQUE: CT of the chest was performed without the administration of intravenous contrast. Multiplanar reformatted images are provided for review. Automated exposure control, iterative reconstruction, and/or weight based adjustment of the mA/kV was utilized to reduce the radiation dose to as low as reasonably achievable. COMPARISON: Radiograph of same day. CLINICAL HISTORY: better eval atelectasis vs infiltrate better eval atelectasis vs infiltrate FINDINGS: MEDIASTINUM: Mild cardiomegaly. Large hiatal hernia. There does appear to be occlusion of the right mainstem bronchus due to aspiration or mucus plugging. LYMPH NODES: Calcified mediastinal lymph nodes are noted suggesting prior granulomatous disease. LUNGS AND PLEURA: Mild right apical and anterior pneumothorax is noted.  Small right pleural effusion is noted. Right lower lobe atelectasis or infiltrate. Material is noted in the posterior portion of right lung which may represent either calcification or aspirated material. SOFT TISSUES/BONES: No acute abnormality of the bones or soft tissues. UPPER ABDOMEN: Limited images of the upper abdomen demonstrates no acute abnormality. IMPRESSION: 1. Mild right apical and anterior pneumothorax. Attempts to call this  finding to the ordering provider were unsuccessful as no one answered my phone calls to the emergency department . 2. Small right pleural effusion. 3. Right lower lobe atelectasis or infiltrate. 4. Occlusion of the right mainstem bronchus, possibly due to aspiration or mucus plugging. 5. Material in the posterior right lung, possibly representing calcification or aspirated material. 6. Calcified mediastinal lymph nodes, suggesting prior granulomatous disease. 7. Mild cardiomegaly. 8. Large hiatal hernia. Electronically signed by: Lynwood Seip MD 11/30/2023 01:49 PM EST RP Workstation: [id number]   DG Chest Port 1 View Result Date: 11/30/2023 CLINICAL DATA:  Cough.  Evaluate for pneumonia. EXAM: PORTABLE CHEST 1 VIEW COMPARISON:  01/23/2018 CT 09/15/2016 FINDINGS: Patient is slightly rotated to the right. Lungs are hypoinflated with mild right basilar opacification which may be due to atelectasis or early infection. Remainder of the lungs are clear. Known moderate size hiatal hernia. Cardiomediastinal silhouette and remainder of the exam is unchanged. IMPRESSION: 1. Hypoinflation with mild right basilar opacification which may be due to atelectasis or early infection. 2. Known moderate size hiatal hernia. Electronically Signed   By: Toribio Agreste M.D.   On: 11/30/2023 11:33        Scheduled Meds:  enoxaparin (LOVENOX) injection  30 mg Subcutaneous Q24H   gabapentin   200 mg Oral Daily   gabapentin   400 mg Oral QHS   ipratropium-albuterol  3 mL Nebulization Q6H   pantoprazole   40 mg Oral Daily   sodium chloride  flush  3 mL Intravenous Q12H   sodium chloride  HYPERTONIC  4 mL Nebulization Q6H   Continuous Infusions:  azithromycin     cefTRIAXone (ROCEPHIN)  IV            Sophie Mao, MD [facility] 12/01/2023, 6:58 AM

## 2023-12-01 NOTE — Progress Notes (Signed)
 NAME:  Suzanne Kim, MRN:  [id number], DOB:  [birth year], LOS: 1 ADMISSION DATE:  11/30/2023, CONSULTATION DATE:  @TODAY @ REFERRING MD:  Dr. Cheryle, CHIEF COMPLAINT:  pneumothorax.   History of Present Illness:  Ms. Suzanne Kim is an 83 y/o F with a PMH significant for multiple sclerosis and hiatal hernia who presents for productive cough and dyspnea found to have a small R secondary spontaneous pneumothorax and mucous plugging of BI with atelectasis of RLL. Pulmonology consulted for evaluation and management of the latter.  Pertinent  Medical History   Past Medical History:  Diagnosis Date   Hiatal hernia    Multiple sclerosis 1988    Significant Hospital Events: Including procedures, antibiotic start and stop dates in addition to other pertinent events   11/13 presented with complaints of shortness of breath and cough x 1 month workup revealed likely component of aspiration pneumonia to right lung with small apical pneumothorax to right.  Evaluated patient in the ED with ultrasound and no clear safe window seen for chest tube placement. Started conservative mucociliary clearance measures including chest PT, bronchodilators, and mucolytics.  Interim History / Subjective:  Patient feels much improved compared to prior. She notes dyspnea and cough has improvement dramatically.  Objective    Blood pressure 133/83, pulse (!) 105, temperature 99.8 F (37.7 C), temperature source Oral, resp. rate 17, height 5' 7 (1.702 m), weight 62.1 kg, SpO2 93%.    FiO2 (%):  [100 %] 100 %   Intake/Output Summary (Last 24 hours) at 12/01/2023 1123 Last data filed at 12/01/2023 0932 Gross per 24 hour  Intake 240 ml  Output --  Net 240 ml   Filed Weights   12/01/23 1000  Weight: 62.1 kg    Examination: General: elderly woman, not in any acute distress HENT: anicteric sclera, well injected conjunctivae, oral mucosa normal Lungs: no evidence of wheezing or rhonchi today. Clear breath sounds  throughout bilateral lungs. Cardiovascular: RRR, no murmurs, no edema Abdomen: soft, non-tender, not distended Extremities: trace edema Neuro: alert, oriented x 3, no focal deficits GU: deferred  Resolved problem list   Assessment and Plan   #Right Secondary Spontaneous Pneumothorax #Right lower lobe mucous plugging #Community Acquired Pneumonia > I reviewed chest x ray today and patient still has a R apical small pneumothorax. Per radiology < 5% of lung volume. She has RLL atelectasis and consolidation as well. The patient notes subjective improvement in symptoms. - Continue aggressive bronchodilators, mucolytics, and chest physiotherapy - Continue CAP antimicrobial coverage - Advised patient to use incentive spirometry 10 times Q 1 H or with every commercial break on TV - Daily CXR - Continue supplemental oxygen. - FU MRSA swab and sputum cultures and adjust antiinfectives as needed - Agree with evaluation for aspiration by SLP. - No plans for chest tube insertion or bronchoscopy at this time  Labs   CBC: Recent Labs  Lab 11/30/23 1115 12/01/23 0546  WBC 12.5* 7.3  NEUTROABS 10.3*  --   HGB 14.7 13.4  HCT 46.2* 42.1  MCV 94.9 93.1  PLT 126* 91*    Basic Metabolic Panel: Recent Labs  Lab 11/30/23 1115 12/01/23 0546  NA 140 142  K 4.5 3.9  CL 104 106  CO2 27 26  GLUCOSE 114* 97  BUN 17 22  CREATININE 1.03* 0.90  CALCIUM 9.7 9.5   GFR: Estimated Creatinine Clearance: 46.9 mL/min (by C-G formula based on SCr of 0.9 mg/dL). Recent Labs  Lab 11/30/23 1115 12/01/23 0546  WBC 12.5* 7.3    Liver Function Tests: Recent Labs  Lab 12/01/23 0546  AST 19  ALT 11  ALKPHOS 76  BILITOT 0.4  PROT 6.2*  ALBUMIN 3.4*   No results for input(s): LIPASE, AMYLASE in the last 168 hours. No results for input(s): AMMONIA in the last 168 hours.  ABG No results found for: PHART, PCO2ART, PO2ART, HCO3, TCO2, ACIDBASEDEF, O2SAT   Coagulation  Profile: No results for input(s): INR, PROTIME in the last 168 hours.  Cardiac Enzymes: No results for input(s): CKTOTAL, CKMB, CKMBINDEX, TROPONINI in the last 168 hours.  HbA1C: No results found for: HGBA1C  CBG: No results for input(s): GLUCAP in the last 168 hours.  Review of Systems:   Not obtained.  Past Medical History:  She,  has a past medical history of Hiatal hernia and Multiple sclerosis (1988).   Surgical History:   Past Surgical History:  Procedure Laterality Date   BREAST ENHANCEMENT SURGERY  1975   ESOPHAGOGASTRODUODENOSCOPY (EGD) WITH PROPOFOL  N/A 05/12/2016   Procedure: ESOPHAGOGASTRODUODENOSCOPY (EGD) WITH PROPOFOL ;  Surgeon: Lamar Bunk, MD;  Location: [facility];  Service: Endoscopy;  Laterality: N/A;   ESOPHAGOGASTRODUODENOSCOPY (EGD) WITH PROPOFOL  N/A 01/23/2018   Procedure: ESOPHAGOGASTRODUODENOSCOPY (EGD) WITH PROPOFOL ;  Surgeon: Avram Lupita BRAVO, MD;  Location: [facility];  Service: Endoscopy;  Laterality: N/A;   TONSILLECTOMY     TOTAL VAGINAL HYSTERECTOMY     WISDOM TOOTH EXTRACTION       Social History:   reports that she has never smoked. She has never used smokeless tobacco. She reports that she does not drink alcohol and does not use drugs.   Family History:  Her family history includes Heart failure in her father. There is no history of Ataxia, Chorea, Dementia, Mental retardation, Migraines, Multiple sclerosis, Neurofibromatosis, Neuropathy, Parkinsonism, Seizures, or Stroke.   Allergies Allergies  Allergen Reactions   Epinephrine Palpitations     Home Medications  Prior to Admission medications   Medication Sig Start Date End Date Taking? Authorizing Provider  albuterol (PROVENTIL) (2.5 MG/3ML) 0.083% nebulizer solution Take 2.5 mg by nebulization in the morning, at noon, and at bedtime. 10/13/20  Yes [provider]  benzonatate (TESSALON) 200 MG capsule Take 200 mg by mouth 3 (three) times daily as needed  for cough.   Yes [provider]  Cholecalciferol  (VITAMIN D3) 125 MCG (5000 UT) CAPS Take 5,000 Units by mouth daily.   Yes [provider]  ferrous sulfate 325 (65 FE) MG EC tablet Take 325 mg by mouth daily with breakfast.   Yes [provider]  gabapentin  (NEURONTIN ) 100 MG capsule May take 1 capsule in morning and in afternoon if needed. Patient taking differently: Take 200 mg by mouth in the morning. 07/19/21  Yes Jaffe, Adam R, DO  gabapentin  (NEURONTIN ) 400 MG capsule Take 400 mg by mouth at bedtime.   Yes [provider]  guaiFENesin -codeine 100-10 MG/5ML syrup Take 10 mLs by mouth at bedtime as needed for cough.   Yes [provider]  loratadine (CLARITIN) 10 MG tablet Take 10 mg by mouth at bedtime.   Yes [provider]  Magnesium 250 MG TABS Take 250 mg by mouth in the morning.   Yes [provider]  metaxalone (SKELAXIN) 800 MG tablet Take 800 mg by mouth in the morning and at bedtime. 05/18/21  Yes [provider]  Multiple Vitamin (MULTIVITAMIN WITH MINERALS) TABS tablet Take 1 tablet by mouth daily with breakfast.   Yes [provider]  omeprazole  (PRILOSEC) 20 MG capsule Take 20 mg by mouth See admin instructions. Take 20 mg by mouth 30 minutes before breakfast   Yes [provider]  oxyCODONE  (ROXICODONE ) 5 MG immediate release tablet Take 1 tablet (5 mg total) by mouth every 4 (four) hours as needed for severe pain. 02/03/21  Yes Smoot, Sarah A, PA-C  potassium gluconate 595 (99 K) MG TABS tablet Take 595 mg by mouth at bedtime.   Yes [provider]  TYLENOL  500 MG tablet Take 1,000 mg by mouth every 8 (eight) hours as needed for mild pain (pain score 1-3) (or headaches).   Yes [provider]  Zinc 50 MG TABS Take 50 mg by mouth in the morning.   Yes [provider]  gabapentin  (NEURONTIN ) 300 MG capsule Take 1 capsule (300 mg total) by mouth at bedtime. Patient not  taking: Reported on 11/30/2023 07/19/21   Skeet Juliene SAUNDERS, DO     Thank you for this interesting consult. I have spent 50 minutes evaluating patient, reviewing chart, and discussing plan of care with patient, family, and primary medical team. If you have any questions or concerns please reach out to me via secure chat.  Paula Southerly, MD  Pulmonary and Critical Care

## 2023-12-02 ENCOUNTER — Inpatient Hospital Stay (HOSPITAL_COMMUNITY)

## 2023-12-02 DIAGNOSIS — J939 Pneumothorax, unspecified: Secondary | ICD-10-CM | POA: Diagnosis not present

## 2023-12-02 MED ORDER — SODIUM CHLORIDE 3 % IN NEBU
4.0000 mL | INHALATION_SOLUTION | Freq: Three times a day (TID) | RESPIRATORY_TRACT | Status: DC
  Administered 2023-12-03 – 2023-12-04 (×5): 4 mL via RESPIRATORY_TRACT
  Filled 2023-12-02 (×5): qty 4

## 2023-12-02 MED ORDER — IPRATROPIUM-ALBUTEROL 0.5-2.5 (3) MG/3ML IN SOLN
3.0000 mL | Freq: Three times a day (TID) | RESPIRATORY_TRACT | Status: DC
  Administered 2023-12-03 – 2023-12-04 (×5): 3 mL via RESPIRATORY_TRACT
  Filled 2023-12-02 (×5): qty 3

## 2023-12-02 NOTE — Progress Notes (Signed)
 Patient stated she did not want to do her CPT via vest for tonight.

## 2023-12-02 NOTE — Plan of Care (Signed)

## 2023-12-02 NOTE — Progress Notes (Signed)
 Pt not able to do vest therapy at this time.

## 2023-12-02 NOTE — Progress Notes (Signed)
 PROGRESS NOTE    Suzanne Kim  FMW:[id number] DOB: [birth year] DOA: 11/30/2023 PCP: [facility]   Brief Narrative:  83 year old female with history of multiple sclerosis, now bedbound presented with worsening shortness of breath and cough.  On presentation, CT of the chest showed a mild right apical pneumothorax with right lower lobe infiltrate and small right pleural effusion.  COVID/influenza/RSV PCR negative.  Pulmonary was consulted.  She was started on IV antibiotics.  Assessment & Plan:   Community-acquired bacterial right lower lobar pneumonia: Bacterial unspecified Acute respiratory failure with hypoxia -Imaging as above.  Currently on high flow nasal cannula.  Continue Rocephin and Zithromax.  Continue nebs - Respiratory panel PCR negative. - Diet as per SLP recommendations.  Small right apical pneumothorax -Pulmonary following.  No need for chest tube at this time.  Continue high flow nasal cannula.  Multiple sclerosis, now bedbound - Fall precautions.  Leukocytosis - Resolved  Thrombocytopenia -Monitor intermittently.  No signs of bleeding.  Obesity class III - Outpatient follow-up   DVT prophylaxis: Lovenox Code Status: DNR Family Communication: Caregiver at bedside Disposition Plan: Status is: Inpatient Remains inpatient appropriate because: Of severity of illness    Consultants: PCCM  Procedures: None  Antimicrobials: Rocephin and Zithromax   Subjective: Patient seen and examined at bedside.  Breathing is slightly better but still having intermittent cough.  No fever, chest pain or shortness of breath reported.  Objective: Vitals:   12/01/23 2106 12/01/23 2209 12/02/23 0210 12/02/23 0606  BP: (!) 114/50   114/66  Pulse: 81   71  Resp: 17   16  Temp: 98.2 F (36.8 C)   98 F (36.7 C)  TempSrc: Oral   Oral  SpO2: 100% 98% 99% 100%  Weight:      Height:        Intake/Output Summary (Last 24 hours) at 12/02/2023  0924 Last data filed at 12/02/2023 9379 Gross per 24 hour  Intake 700 ml  Output 450 ml  Net 250 ml   Filed Weights   12/01/23 1000  Weight: 62.1 kg    Examination:  General: On high flow nasal cannula oxygen.  No distress ENT/neck: No thyromegaly.  JVD is not elevated  respiratory: Decreased breath sounds at bases bilaterally with some crackles; no wheezing  CVS: S1-S2 heard, rate controlled currently Abdominal: Soft, nontender, slightly distended; no organomegaly, bowel sounds are heard Extremities: Trace lower extremity edema; no cyanosis  CNS: Awake and alert.  Slow to respond.  Poor historian.   Lymph: No obvious lymphadenopathy Skin: No obvious ecchymosis/lesions  psych: Affect is mostly flat.  Currently not agitated.     Data Reviewed: I have personally reviewed following labs and imaging studies  CBC: Recent Labs  Lab 11/30/23 1115 12/01/23 0546  WBC 12.5* 7.3  NEUTROABS 10.3*  --   HGB 14.7 13.4  HCT 46.2* 42.1  MCV 94.9 93.1  PLT 126* 91*   Basic Metabolic Panel: Recent Labs  Lab 11/30/23 1115 12/01/23 0546  NA 140 142  K 4.5 3.9  CL 104 106  CO2 27 26  GLUCOSE 114* 97  BUN 17 22  CREATININE 1.03* 0.90  CALCIUM 9.7 9.5   GFR: Estimated Creatinine Clearance: 46.9 mL/min (by C-G formula based on SCr of 0.9 mg/dL). Liver Function Tests: Recent Labs  Lab 12/01/23 0546  AST 19  ALT 11  ALKPHOS 76  BILITOT 0.4  PROT 6.2*  ALBUMIN 3.4*   No results for input(s): LIPASE, AMYLASE in  the last 168 hours. No results for input(s): AMMONIA in the last 168 hours. Coagulation Profile: No results for input(s): INR, PROTIME in the last 168 hours. Cardiac Enzymes: No results for input(s): CKTOTAL, CKMB, CKMBINDEX, TROPONINI in the last 168 hours. BNP (last 3 results) Recent Labs    11/30/23 1115  PROBNP 496.0*   HbA1C: No results for input(s): HGBA1C in the last 72 hours. CBG: No results for input(s): GLUCAP in the last  168 hours. Lipid Profile: No results for input(s): CHOL, HDL, LDLCALC, TRIG, CHOLHDL, LDLDIRECT in the last 72 hours. Thyroid  Function Tests: No results for input(s): TSH, T4TOTAL, FREET4, T3FREE, THYROIDAB in the last 72 hours. Anemia Panel: No results for input(s): VITAMINB12, FOLATE, FERRITIN, TIBC, IRON, RETICCTPCT in the last 72 hours. Sepsis Labs: No results for input(s): PROCALCITON, LATICACIDVEN in the last 168 hours.  Recent Results (from the past 240 hours)  Resp panel by RT-PCR (RSV, Flu A&B, Covid) Anterior Nasal Swab     Status: None   Collection Time: 11/30/23 12:29 PM   Specimen: Anterior Nasal Swab  Result Value Ref Range Status   SARS Coronavirus 2 by RT PCR NEGATIVE NEGATIVE Final    Comment: (NOTE) SARS-CoV-2 target nucleic acids are NOT DETECTED.  The SARS-CoV-2 RNA is generally detectable in upper respiratory specimens during the acute phase of infection. The lowest concentration of SARS-CoV-2 viral copies this assay can detect is 138 copies/mL. A negative result does not preclude SARS-Cov-2 infection and should not be used as the sole basis for treatment or other patient management decisions. A negative result may occur with  improper specimen collection/handling, submission of specimen other than nasopharyngeal swab, presence of viral mutation(s) within the areas targeted by this assay, and inadequate number of viral copies(<138 copies/mL). A negative result must be combined with clinical observations, patient history, and epidemiological information. The expected result is Negative.  Fact Sheet for Patients:  bloggercourse.com  Fact Sheet for Healthcare Providers:  seriousbroker.it  This test is no t yet approved or cleared by the [place]  FDA and  has been authorized for detection and/or diagnosis of SARS-CoV-2 by FDA under an Emergency Use Authorization (EUA).  This EUA will remain  in effect (meaning this test can be used) for the duration of the COVID-19 declaration under Section 564(b)(1) of the Act, 21 U.S.C.section 360bbb-3(b)(1), unless the authorization is terminated  or revoked sooner.       Influenza A by PCR NEGATIVE NEGATIVE Final   Influenza B by PCR NEGATIVE NEGATIVE Final    Comment: (NOTE) The Xpert Xpress SARS-CoV-2/FLU/RSV plus assay is intended as an aid in the diagnosis of influenza from Nasopharyngeal swab specimens and should not be used as a sole basis for treatment. Nasal washings and aspirates are unacceptable for Xpert Xpress SARS-CoV-2/FLU/RSV testing.  Fact Sheet for Patients: bloggercourse.com  Fact Sheet for Healthcare Providers: seriousbroker.it  This test is not yet approved or cleared by the [place]  FDA and has been authorized for detection and/or diagnosis of SARS-CoV-2 by FDA under an Emergency Use Authorization (EUA). This EUA will remain in effect (meaning this test can be used) for the duration of the COVID-19 declaration under Section 564(b)(1) of the Act, 21 U.S.C. section 360bbb-3(b)(1), unless the authorization is terminated or revoked.     Resp Syncytial Virus by PCR NEGATIVE NEGATIVE Final    Comment: (NOTE) Fact Sheet for Patients: bloggercourse.com  Fact Sheet for Healthcare Providers: seriousbroker.it  This test is not yet approved or cleared by the  [place]  FDA and has been authorized for detection and/or diagnosis of SARS-CoV-2 by FDA under an Emergency Use Authorization (EUA). This EUA will remain in effect (meaning this test can be used) for the duration of the COVID-19 declaration under Section 564(b)(1) of the Act, 21 U.S.C. section 360bbb-3(b)(1), unless the authorization is terminated or revoked.  Performed at [facility], [address] [place], [place] [address]   Respiratory (~20 pathogens) panel by PCR     Status: None   Collection Time: 11/30/23 12:29 PM   Specimen: Nasopharyngeal Swab; Respiratory  Result Value Ref Range Status   Adenovirus NOT DETECTED NOT DETECTED Final   Coronavirus 229E NOT DETECTED NOT DETECTED Final    Comment: (NOTE) The Coronavirus on the Respiratory Panel, DOES NOT test for the novel  Coronavirus (2019 nCoV)    Coronavirus HKU1 NOT DETECTED NOT DETECTED Final   Coronavirus NL63 NOT DETECTED NOT DETECTED Final   Coronavirus OC43 NOT DETECTED NOT DETECTED Final   Metapneumovirus NOT DETECTED NOT DETECTED Final   Rhinovirus / Enterovirus NOT DETECTED NOT DETECTED Final   Influenza A NOT DETECTED NOT DETECTED Final   Influenza B NOT DETECTED NOT DETECTED Final   Parainfluenza Virus 1 NOT DETECTED NOT DETECTED Final   Parainfluenza Virus 2 NOT DETECTED NOT DETECTED Final   Parainfluenza Virus 3 NOT DETECTED NOT DETECTED Final   Parainfluenza Virus 4 NOT DETECTED NOT DETECTED Final   Respiratory Syncytial Virus NOT DETECTED NOT DETECTED Final   Bordetella pertussis NOT DETECTED NOT DETECTED Final   Bordetella Parapertussis NOT DETECTED NOT DETECTED Final   Chlamydophila pneumoniae NOT DETECTED NOT DETECTED Final   Mycoplasma pneumoniae NOT DETECTED NOT DETECTED Final    Comment: Performed at [facility], [address] [place], [place] [address]         Radiology Studies: DG CHEST PORT 1 VIEW Result Date: 12/02/2023 EXAM: 1 VIEW(S) XRAY OF THE CHEST 12/02/2023 05:46:00 AM COMPARISON: Portable chest dated 12/01/2023 05:22:00 AM. CLINICAL HISTORY: Pneumothorax. FINDINGS: LIMITATIONS/ARTIFACTS: Limited assessment for pneumothorax given patient kyphosis and moderate rotation to the right. LUNGS AND PLEURA: There is increased opacity in the right apex which could be due to a new infiltrate, mucous plugging with atelectasis, or loculated fluid. There is a small left pleural effusion.  Perihilar atelectatic bands are present with dense consolidation in the right lower lobe. The hypoexpanded lungs are clear Elsewhere. No pneumothorax. HEART AND MEDIASTINUM: There is stable cardiomegaly, large hiatal hernia, aortic tortuosity. Aortic atherosclerosis. BONES AND SOFT TISSUES: No acute osseous abnormality. IMPRESSION: 1. No visible pneumothorax, but limited exam due to positioning and kyphosis . 2. Dense consolidation in the right lower lobe. 3. Increased opacity in the right apex, which may represent new infiltrate, mucous plugging with atelectasis, or loculated fluid. 4. Small left pleural effusion. Electronically signed by: Francis Quam MD 12/02/2023 06:05 AM EST RP Workstation: [id number]   DG Swallowing Func-Speech Pathology Result Date: 12/01/2023 Table formatting from the original result was not included. Modified Barium Swallow Study Patient Details Name: KASSIAH MCCRORY MRN: [id number] Date of Birth: February 20, 1940 Today's Date: 12/01/2023 HPI/PMH: HPI: 83 year old female with advanced MS, bedbound, admitted with PNA. CT of the chest which revealed a mild right apical pneumothorax with right lower lobe infiltrate and small right pleural effusion.  The patient was COVID, flu, and RSV negative. Clinical Impression: Clinical Impression: Patient presents with a mild pharyngeal phase dysphagia characterized by decreased laryngeal closure with resultant trace and  intermittent penetration of thin liquids. View below the vocal cords difficulty due to shadows from shoulders/posture although penetration did not appear to reach below the level of the vocal cords, often remaining above and lifting with cued throat clears and subsequent swallows. Pharyngeal clearance normal, no significant esophageal deficits noted although cannot r/o during today's study, particularly in light of c/o intermittent globus with solids. Defer possible esophagram to MD if feel necessary. SLP will f/u briefly at bedside to  reinforce safe swallowing strategies. Factors that may increase risk of adverse event in presence of aspiration Noe & Lianne 2021): Factors that may increase risk of adverse event in presence of aspiration Noe & Lianne 2021): Limited mobility; Frail or deconditioned Recommendations/Plan: Swallowing Evaluation Recommendations Swallowing Evaluation Recommendations Recommendations: PO diet PO Diet Recommendation: Regular; Thin liquids (Level 0) Liquid Administration via: Cup; Straw Medication Administration: Whole meds with liquid Supervision: Staff to assist with self-feeding Swallowing strategies  : Slow rate; Small bites/sips; Clear throat intermittently Postural changes: Position pt fully upright for meals Oral care recommendations: Oral care BID (2x/day) Treatment Plan Treatment Plan Treatment recommendations: Therapy as outlined in treatment plan below Follow-up recommendations: No SLP follow up Functional status assessment: Patient has had a recent decline in their functional status and demonstrates the ability to make significant improvements in function in a reasonable and predictable amount of time. Treatment frequency: Min 1x/week Treatment duration: 1 week Interventions: Aspiration precaution training; Compensatory techniques; Patient/family education; Diet toleration management by SLP Recommendations Recommendations for follow up therapy are one component of a multi-disciplinary discharge planning process, led by the attending physician.  Recommendations may be updated based on patient status, additional functional criteria and insurance authorization. Assessment: Orofacial Exam: Orofacial Exam Oral Cavity - Dentition: Adequate natural dentition Orofacial Anatomy: WFL Oral Motor/Sensory Function: WFL Anatomy: No data recorded Boluses Administered: Boluses Administered Boluses Administered: Thin liquids (Level 0); Puree; Solid  Oral Impairment Domain: Oral Impairment Domain Lip Closure: No labial  escape Tongue control during bolus hold: Cohesive bolus between tongue to palatal seal Bolus preparation/mastication: Timely and efficient chewing and mashing Bolus transport/lingual motion: Brisk tongue motion Oral residue: Complete oral clearance Location of oral residue : N/A Initiation of pharyngeal swallow : Valleculae; Pyriform sinuses  Pharyngeal Impairment Domain: Pharyngeal Impairment Domain Soft palate elevation: No bolus between soft palate (SP)/pharyngeal wall (PW) Laryngeal elevation: Complete superior movement of thyroid  cartilage with complete approximation of arytenoids to epiglottic petiole Anterior hyoid excursion: Complete anterior movement Epiglottic movement: Complete inversion Laryngeal vestibule closure: Incomplete, narrow column air/contrast in laryngeal vestibule (intermittent) Pharyngeal stripping wave : -- (difficult to view) Pharyngeal contraction (A/P view only): N/A Pharyngoesophageal segment opening: Complete distension and complete duration, no obstruction of flow Tongue base retraction: No contrast between tongue base and posterior pharyngeal wall (PPW) Pharyngeal residue: Trace residue within or on pharyngeal structures Location of pharyngeal residue: Valleculae; Pyriform sinuses  Esophageal Impairment Domain: No data recorded Pill: Pill Consistency administered: Thin liquids (Level 0) Thin liquids (Level 0): Impaired (see clinical impressions) Penetration/Aspiration Scale Score: Penetration/Aspiration Scale Score 1.  Material does not enter airway: Puree; Solid; Pill 3.  Material enters airway, remains ABOVE vocal cords and not ejected out: Thin liquids (Level 0) Compensatory Strategies: No data recorded  General Information: No data recorded Diet Prior to this Study: Thin liquids (Level 0) (full liquids)   Temperature : Febrile   Respiratory Status: WFL   Supplemental O2: Nasal cannula   History of Recent Intubation: No  Behavior/Cognition: Alert; Pleasant mood; Cooperative  Self-Feeding Abilities: Needs assist with self-feeding Baseline vocal quality/speech: Normal Volitional Cough: Able to elicit Volitional Swallow: Able to elicit Exam Limitations: Poor positioning; Limited visibility Goal Planning: No data recorded No data recorded No data recorded No data recorded Consulted and agree with results and recommendations: Patient; Nurse Pain: Pain Assessment Pain Assessment: No/denies pain End of Session: Start Time:SLP Start Time (ACUTE ONLY): 1235 Stop Time: SLP Stop Time (ACUTE ONLY): 1250 Time Calculation:SLP Time Calculation (min) (ACUTE ONLY): 15 min Charges: SLP Evaluations $ SLP Speech Visit: 1 Visit SLP Evaluations $BSS Swallow: 1 Procedure $MBS Swallow: 1 Procedure SLP visit diagnosis: SLP Visit Diagnosis: Dysphagia, pharyngeal phase (R13.13) Past Medical History: Past Medical History: Diagnosis Date  Hiatal hernia   Multiple sclerosis 1988 Past Surgical History: Past Surgical History: Procedure Laterality Date  BREAST ENHANCEMENT SURGERY  1975  ESOPHAGOGASTRODUODENOSCOPY (EGD) WITH PROPOFOL  N/A 05/12/2016  Procedure: ESOPHAGOGASTRODUODENOSCOPY (EGD) WITH PROPOFOL ;  Surgeon: Lamar Bunk, MD;  Location: [facility];  Service: Endoscopy;  Laterality: N/A;  ESOPHAGOGASTRODUODENOSCOPY (EGD) WITH PROPOFOL  N/A 01/23/2018  Procedure: ESOPHAGOGASTRODUODENOSCOPY (EGD) WITH PROPOFOL ;  Surgeon: Avram Lupita BRAVO, MD;  Location: [facility];  Service: Endoscopy;  Laterality: N/A;  TONSILLECTOMY    TOTAL VAGINAL HYSTERECTOMY    WISDOM TOOTH EXTRACTION   Leah McCoy MA, CCC-SLP McCoy Leah Meryl 12/01/2023, 12:58 PM  DG CHEST PORT 1 VIEW Result Date: 12/01/2023 EXAM: 1 VIEW(S) XRAY OF THE CHEST 12/01/2023 05:32:00 AM COMPARISON: Chest CT yesterday at 12:45 pm. CLINICAL HISTORY: Pneumothorax. FINDINGS: LUNGS AND PLEURA: Small right apical pneumothorax estimated at 5% or less of the chest volume although possibly underestimated AP. This is not notably changed from yesterday's portable chest.  Small right pleural effusion. The lungs are expiratory. There is dense consolidation in the right lower lobe with calcifications in the consolidated lung. Coarse atelectatic change noted in the left lower lung field. Remaining lungs are generally clear. Overall aeration seems unchanged. HEART AND MEDIASTINUM: Mild cardiomegaly. Stable mediastinum with aortic atherosclerosis. Multifocal calcified mediastinal and hilar lymph nodes. BONES AND SOFT TISSUES: No acute osseous abnormality. Large hiatal hernia. IMPRESSION: 1. Small right apical pneumothorax, estimated at 5% or less of chest volume, stable compared to yesterdays exam. 2. Dense consolidation in the right lower lobe with internal calcifications in the consolidated lung. 3. Small right pleural effusion. 4. Coarse atelectatic change in the left lower lung field. 5. Large hiatal hernia. Electronically signed by: Francis Quam MD 12/01/2023 05:49 AM EST RP Workstation: [id number]   CT Chest Wo Contrast Result Date: 11/30/2023 EXAM: CT CHEST WITHOUT CONTRAST 11/30/2023 01:01:07 PM TECHNIQUE: CT of the chest was performed without the administration of intravenous contrast. Multiplanar reformatted images are provided for review. Automated exposure control, iterative reconstruction, and/or weight based adjustment of the mA/kV was utilized to reduce the radiation dose to as low as reasonably achievable. COMPARISON: Radiograph of same day. CLINICAL HISTORY: better eval atelectasis vs infiltrate better eval atelectasis vs infiltrate FINDINGS: MEDIASTINUM: Mild cardiomegaly. Large hiatal hernia. There does appear to be occlusion of the right mainstem bronchus due to aspiration or mucus plugging. LYMPH NODES: Calcified mediastinal lymph nodes are noted suggesting prior granulomatous disease. LUNGS AND PLEURA: Mild right apical and anterior pneumothorax is noted. Small right pleural effusion is noted. Right lower lobe atelectasis or infiltrate. Material is noted in the  posterior portion of right lung which may represent either calcification or aspirated material. SOFT TISSUES/BONES: No acute abnormality of the bones or soft tissues. UPPER ABDOMEN: Limited images of the upper abdomen demonstrates no  acute abnormality. IMPRESSION: 1. Mild right apical and anterior pneumothorax. Attempts to call this finding to the ordering provider were unsuccessful as no one answered my phone calls to the emergency department . 2. Small right pleural effusion. 3. Right lower lobe atelectasis or infiltrate. 4. Occlusion of the right mainstem bronchus, possibly due to aspiration or mucus plugging. 5. Material in the posterior right lung, possibly representing calcification or aspirated material. 6. Calcified mediastinal lymph nodes, suggesting prior granulomatous disease. 7. Mild cardiomegaly. 8. Large hiatal hernia. Electronically signed by: Lynwood Seip MD 11/30/2023 01:49 PM EST RP Workstation: [id number]   DG Chest Port 1 View Result Date: 11/30/2023 CLINICAL DATA:  Cough.  Evaluate for pneumonia. EXAM: PORTABLE CHEST 1 VIEW COMPARISON:  01/23/2018 CT 09/15/2016 FINDINGS: Patient is slightly rotated to the right. Lungs are hypoinflated with mild right basilar opacification which may be due to atelectasis or early infection. Remainder of the lungs are clear. Known moderate size hiatal hernia. Cardiomediastinal silhouette and remainder of the exam is unchanged. IMPRESSION: 1. Hypoinflation with mild right basilar opacification which may be due to atelectasis or early infection. 2. Known moderate size hiatal hernia. Electronically Signed   By: Toribio Agreste M.D.   On: 11/30/2023 11:33        Scheduled Meds:  enoxaparin (LOVENOX) injection  30 mg Subcutaneous Q24H   gabapentin   200 mg Oral Daily   gabapentin   400 mg Oral QHS   ipratropium-albuterol  3 mL Nebulization Q6H   metaxalone  800 mg Oral BID   pantoprazole   40 mg Oral Daily   sodium chloride  flush  3 mL Intravenous Q12H    sodium chloride  HYPERTONIC  4 mL Nebulization Q6H   Continuous Infusions:  azithromycin     cefTRIAXone (ROCEPHIN)  IV 1 g (12/01/23 1410)          Sophie Mao, MD [facility] 12/02/2023, 9:24 AM

## 2023-12-02 NOTE — Progress Notes (Signed)
 Breathing tx and vest therapy not done due to pt just getting started to eat. Pt has PRN if needed.

## 2023-12-02 NOTE — Plan of Care (Signed)
  Problem: Nutrition: Goal: Adequate nutrition will be maintained Outcome: Progressing   Problem: Elimination: Goal: Will not experience complications related to bowel motility Outcome: Progressing   Problem: Elimination: Goal: Will not experience complications related to urinary retention Outcome: Progressing   Problem: Safety: Goal: Ability to remain free from injury will improve Outcome: Progressing

## 2023-12-02 NOTE — Progress Notes (Signed)
 NAME:  Suzanne Kim, MRN:  [id number], DOB:  08/21/1940, LOS: 2 ADMISSION DATE:  11/30/2023, CONSULTATION DATE:  @TODAY @ REFERRING MD:  Dr. Cheryle, CHIEF COMPLAINT:  pneumothorax.   History of Present Illness:  Suzanne Kim is an 83 y/o F with a PMH significant for multiple sclerosis and hiatal hernia who presents for productive cough and dyspnea found to have a small R secondary spontaneous pneumothorax and mucous plugging of BI with atelectasis of RLL. Pulmonology consulted for evaluation and management of the latter.  Pertinent  Medical History   Past Medical History:  Diagnosis Date   Hiatal hernia    Multiple sclerosis 1988    Significant Hospital Events: Including procedures, antibiotic start and stop dates in addition to other pertinent events   11/13 presented with complaints of shortness of breath and cough x 1 month workup revealed likely component of aspiration pneumonia to right lung with small apical pneumothorax to right.  Evaluated Suzanne Kim in the ED with ultrasound and no clear safe window seen for chest tube placement. Started conservative mucociliary clearance measures including chest PT, bronchodilators, and mucolytics. 11/14 pneumothorax stable, continued conservative measures  Interim History / Subjective:  Suzanne Kim reports cough and dyspnea are improved.  Objective    Blood pressure (!) 111/55, pulse 72, temperature 98.1 F (36.7 C), temperature source Oral, resp. rate 16, height 5' 7 (1.702 m), weight 62.1 kg, SpO2 100%.        Intake/Output Summary (Last 24 hours) at 12/02/2023 1551 Last data filed at 12/02/2023 0620 Gross per 24 hour  Intake 220 ml  Output 450 ml  Net -230 ml   Filed Weights   12/01/23 1000  Weight: 62.1 kg    Examination: General: elderly woman, not in any acute distress Eyes: PERRL, no scleral icterus ENMT: oropharynx clear, good dentition, no oral lesions, mallampati score IV Neck: JVD flat CV: RRR, systolic murmur, nl  S1 and S2, trace peripheral edema Resp: clear anteriorly Abdom: Normoactive bowel sounds, soft, nontender, nondistended, no hepatosplenomegaly Neuro: Awake alert oriented to person place time and situation  Resolved problem list   Assessment and Plan   #Right Secondary Spontaneous Pneumothorax #Right lower lobe mucous plugging #Community Acquired Pneumonia > I reviewed chest x ray this AM and pneumothorax is not visible. The Suzanne Kim's symptoms are overall improved. - Continue aggressive bronchodilators, mucolytics, and chest physiotherapy - Continue CAP antimicrobial coverage - Advised Suzanne Kim to use incentive spirometry 10 times Q 1 H or with every commercial break on TV - Daily CXR - Continue supplemental oxygen. - FU MRSA swab and sputum cultures and adjust antiinfectives as needed - Agree with evaluation for aspiration by SLP. - No plans for chest tube insertion or bronchoscopy at this time - Likely to need CT chest in the next 24 - 48 hours to assess for improvement of mucous plugging and pneumothorax  Labs   CBC: Recent Labs  Lab 11/30/23 1115 12/01/23 0546  WBC 12.5* 7.3  NEUTROABS 10.3*  --   HGB 14.7 13.4  HCT 46.2* 42.1  MCV 94.9 93.1  PLT 126* 91*    Basic Metabolic Panel: Recent Labs  Lab 11/30/23 1115 12/01/23 0546  NA 140 142  K 4.5 3.9  CL 104 106  CO2 27 26  GLUCOSE 114* 97  BUN 17 22  CREATININE 1.03* 0.90  CALCIUM 9.7 9.5   GFR: Estimated Creatinine Clearance: 46.9 mL/min (by C-G formula based on SCr of 0.9 mg/dL). Recent Labs  Lab 11/30/23 1115 12/01/23  0546  WBC 12.5* 7.3    Liver Function Tests: Recent Labs  Lab 12/01/23 0546  AST 19  ALT 11  ALKPHOS 76  BILITOT 0.4  PROT 6.2*  ALBUMIN 3.4*   No results for input(s): LIPASE, AMYLASE in the last 168 hours. No results for input(s): AMMONIA in the last 168 hours.  ABG No results found for: PHART, PCO2ART, PO2ART, HCO3, TCO2, ACIDBASEDEF, O2SAT    Coagulation Profile: No results for input(s): INR, PROTIME in the last 168 hours.  Cardiac Enzymes: No results for input(s): CKTOTAL, CKMB, CKMBINDEX, TROPONINI in the last 168 hours.  HbA1C: No results found for: HGBA1C  CBG: No results for input(s): GLUCAP in the last 168 hours.  Review of Systems:   Not obtained.  Past Medical History:  Suzanne Kim,  has a past medical history of Hiatal hernia and Multiple sclerosis (1988).   Surgical History:   Past Surgical History:  Procedure Laterality Date   BREAST ENHANCEMENT SURGERY  1975   ESOPHAGOGASTRODUODENOSCOPY (EGD) WITH PROPOFOL  N/A 05/12/2016   Procedure: ESOPHAGOGASTRODUODENOSCOPY (EGD) WITH PROPOFOL ;  Surgeon: Lamar Bunk, MD;  Location: [facility];  Service: Endoscopy;  Laterality: N/A;   ESOPHAGOGASTRODUODENOSCOPY (EGD) WITH PROPOFOL  N/A 01/23/2018   Procedure: ESOPHAGOGASTRODUODENOSCOPY (EGD) WITH PROPOFOL ;  Surgeon: Avram Lupita BRAVO, MD;  Location: [facility];  Service: Endoscopy;  Laterality: N/A;   TONSILLECTOMY     TOTAL VAGINAL HYSTERECTOMY     WISDOM TOOTH EXTRACTION       Social History:   reports that Suzanne Kim has never smoked. Suzanne Kim has never used smokeless tobacco. Suzanne Kim reports that Suzanne Kim does not drink alcohol and does not use drugs.   Family History:  Suzanne Kim family history includes Heart failure in Suzanne Kim father. There is no history of Ataxia, Chorea, Dementia, Mental retardation, Migraines, Multiple sclerosis, Neurofibromatosis, Neuropathy, Parkinsonism, Seizures, or Stroke.   Allergies Allergies  Allergen Reactions   Epinephrine Palpitations     Home Medications  Prior to Admission medications   Medication Sig Start Date End Date Taking? Authorizing Provider  albuterol (PROVENTIL) (2.5 MG/3ML) 0.083% nebulizer solution Take 2.5 mg by nebulization in the morning, at noon, and at bedtime. 10/13/20  Yes [provider]  benzonatate (TESSALON) 200 MG capsule Take 200 mg by mouth 3 (three) times  daily as needed for cough.   Yes [provider]  Cholecalciferol  (VITAMIN D3) 125 MCG (5000 UT) CAPS Take 5,000 Units by mouth daily.   Yes [provider]  ferrous sulfate 325 (65 FE) MG EC tablet Take 325 mg by mouth daily with breakfast.   Yes [provider]  gabapentin  (NEURONTIN ) 100 MG capsule May take 1 capsule in morning and in afternoon if needed. Suzanne Kim taking differently: Take 200 mg by mouth in the morning. 07/19/21  Yes Jaffe, Adam R, DO  gabapentin  (NEURONTIN ) 400 MG capsule Take 400 mg by mouth at bedtime.   Yes [provider]  guaiFENesin -codeine 100-10 MG/5ML syrup Take 10 mLs by mouth at bedtime as needed for cough.   Yes [provider]  loratadine (CLARITIN) 10 MG tablet Take 10 mg by mouth at bedtime.   Yes [provider]  Magnesium 250 MG TABS Take 250 mg by mouth in the morning.   Yes [provider]  metaxalone (SKELAXIN) 800 MG tablet Take 800 mg by mouth in the morning and at bedtime. 05/18/21  Yes [provider]  Multiple Vitamin (MULTIVITAMIN WITH MINERALS) TABS tablet Take 1 tablet by mouth daily with breakfast.  Yes [provider]  omeprazole  (PRILOSEC) 20 MG capsule Take 20 mg by mouth See admin instructions. Take 20 mg by mouth 30 minutes before breakfast   Yes [provider]  oxyCODONE  (ROXICODONE ) 5 MG immediate release tablet Take 1 tablet (5 mg total) by mouth every 4 (four) hours as needed for severe pain. 02/03/21  Yes Smoot, Sarah A, PA-C  potassium gluconate 595 (99 K) MG TABS tablet Take 595 mg by mouth at bedtime.   Yes [provider]  TYLENOL  500 MG tablet Take 1,000 mg by mouth every 8 (eight) hours as needed for mild pain (pain score 1-3) (or headaches).   Yes [provider]  Zinc 50 MG TABS Take 50 mg by mouth in the morning.   Yes [provider]  gabapentin  (NEURONTIN ) 300 MG capsule Take 1 capsule (300 mg total) by mouth at  bedtime. Suzanne Kim not taking: Reported on 11/30/2023 07/19/21   Skeet Juliene SAUNDERS, DO     Thank you for this interesting consult. I have spent 30 minutes evaluating Suzanne Kim, reviewing chart, and discussing plan of care with Suzanne Kim, family, and primary medical team. If you have any questions or concerns please reach out to me via secure chat.  Paula Southerly, MD [facility]

## 2023-12-03 ENCOUNTER — Inpatient Hospital Stay (HOSPITAL_COMMUNITY)

## 2023-12-03 DIAGNOSIS — J9312 Secondary spontaneous pneumothorax: Secondary | ICD-10-CM | POA: Diagnosis not present

## 2023-12-03 NOTE — Plan of Care (Signed)

## 2023-12-03 NOTE — Progress Notes (Signed)
 Vest on hold tell patient gets back in bed.

## 2023-12-03 NOTE — Progress Notes (Signed)
 Vest on hold due to pt in chair. Pt needs to be in bed for vest therapy due to pt condition.

## 2023-12-03 NOTE — Progress Notes (Signed)
 NAME:  Suzanne Kim, MRN:  [id number], DOB:  08/17/1940, LOS: 3 ADMISSION DATE:  11/30/2023, CONSULTATION DATE:  @TODAY @ REFERRING MD:  Dr. Cheryle, CHIEF COMPLAINT:  pneumothorax.   History of Present Illness:  Suzanne Kim is an 83 y/o F with a PMH significant for multiple sclerosis and hiatal hernia who presents for productive cough and dyspnea found to have a small R secondary spontaneous pneumothorax and mucous plugging of BI with atelectasis of RLL. Pulmonology consulted for evaluation and management of the latter.  Pertinent  Medical History   Past Medical History:  Diagnosis Date   Hiatal hernia    Multiple sclerosis 1988    Significant Hospital Events: Including procedures, antibiotic start and stop dates in addition to other pertinent events   11/13 presented with complaints of shortness of breath and cough x 1 month workup revealed likely component of aspiration pneumonia to right lung with small apical pneumothorax to right.  Evaluated patient in the ED with ultrasound and no clear safe window seen for chest tube placement. Started conservative mucociliary clearance measures including chest PT, bronchodilators, and mucolytics. 11/14 pneumothorax stable, continued conservative measures  Interim History / Subjective:  Patient reports cough and dyspnea are improved.  Objective    Blood pressure (!) 112/55, pulse 70, temperature 99.2 F (37.3 C), temperature source Oral, resp. rate 18, height 5' 7 (1.702 m), weight 62.1 kg, SpO2 99%.        Intake/Output Summary (Last 24 hours) at 12/03/2023 1454 Last data filed at 12/03/2023 1421 Gross per 24 hour  Intake 690 ml  Output 500 ml  Net 190 ml   Filed Weights   12/01/23 1000  Weight: 62.1 kg    Examination: General: elderly woman, no distress Eyes: PERRL, no scleral icterus Skin: warm, intact, no rashes Neck: JVD flat CV: RRR, systolic murmur Resp: clear on anterior auscultation Neuro: Awake alert oriented  to person place time and situation  Resolved problem list   Assessment and Plan   #Right Secondary Spontaneous Pneumothorax #Right lower lobe mucous plugging #Community Acquired Pneumonia > I reviewed chest x ray this AM and pneumothorax is not visible. The patient's symptoms are overall improved. - Continue aggressive bronchodilators, mucolytics, and chest physiotherapy - Continue CAP antimicrobial coverage - Advised patient to use incentive spirometry 10 times Q 1 H or with every commercial break on TV - Daily CXR - Continue supplemental oxygen. - FU MRSA swab and sputum cultures and adjust antiinfectives as needed - Agree with evaluation for aspiration by SLP. - No plans for chest tube insertion or bronchoscopy at this time - Dry CT CHEST on 11/17 to evaluate pneumothorax and RLL mucous plugging.  Labs   CBC: Recent Labs  Lab 11/30/23 1115 12/01/23 0546  WBC 12.5* 7.3  NEUTROABS 10.3*  --   HGB 14.7 13.4  HCT 46.2* 42.1  MCV 94.9 93.1  PLT 126* 91*    Basic Metabolic Panel: Recent Labs  Lab 11/30/23 1115 12/01/23 0546  NA 140 142  K 4.5 3.9  CL 104 106  CO2 27 26  GLUCOSE 114* 97  BUN 17 22  CREATININE 1.03* 0.90  CALCIUM 9.7 9.5   GFR: Estimated Creatinine Clearance: 46.9 mL/min (by C-G formula based on SCr of 0.9 mg/dL). Recent Labs  Lab 11/30/23 1115 12/01/23 0546  WBC 12.5* 7.3    Liver Function Tests: Recent Labs  Lab 12/01/23 0546  AST 19  ALT 11  ALKPHOS 76  BILITOT 0.4  PROT 6.2*  ALBUMIN 3.4*   No results for input(s): LIPASE, AMYLASE in the last 168 hours. No results for input(s): AMMONIA in the last 168 hours.  ABG No results found for: PHART, PCO2ART, PO2ART, HCO3, TCO2, ACIDBASEDEF, O2SAT   Coagulation Profile: No results for input(s): INR, PROTIME in the last 168 hours.  Cardiac Enzymes: No results for input(s): CKTOTAL, CKMB, CKMBINDEX, TROPONINI in the last 168 hours.  HbA1C: No results  found for: HGBA1C  CBG: No results for input(s): GLUCAP in the last 168 hours.  Review of Systems:   Not obtained.  Past Medical History:  She,  has a past medical history of Hiatal hernia and Multiple sclerosis (1988).   Surgical History:   Past Surgical History:  Procedure Laterality Date   BREAST ENHANCEMENT SURGERY  1975   ESOPHAGOGASTRODUODENOSCOPY (EGD) WITH PROPOFOL  N/A 05/12/2016   Procedure: ESOPHAGOGASTRODUODENOSCOPY (EGD) WITH PROPOFOL ;  Surgeon: Lamar Bunk, MD;  Location: [facility];  Service: Endoscopy;  Laterality: N/A;   ESOPHAGOGASTRODUODENOSCOPY (EGD) WITH PROPOFOL  N/A 01/23/2018   Procedure: ESOPHAGOGASTRODUODENOSCOPY (EGD) WITH PROPOFOL ;  Surgeon: Avram Lupita BRAVO, MD;  Location: [facility];  Service: Endoscopy;  Laterality: N/A;   TONSILLECTOMY     TOTAL VAGINAL HYSTERECTOMY     WISDOM TOOTH EXTRACTION       Social History:   reports that she has never smoked. She has never used smokeless tobacco. She reports that she does not drink alcohol and does not use drugs.   Family History:  Her family history includes Heart failure in her father. There is no history of Ataxia, Chorea, Dementia, Mental retardation, Migraines, Multiple sclerosis, Neurofibromatosis, Neuropathy, Parkinsonism, Seizures, or Stroke.   Allergies Allergies  Allergen Reactions   Epinephrine Palpitations     Home Medications  Prior to Admission medications   Medication Sig Start Date End Date Taking? Authorizing Provider  albuterol (PROVENTIL) (2.5 MG/3ML) 0.083% nebulizer solution Take 2.5 mg by nebulization in the morning, at noon, and at bedtime. 10/13/20  Yes [provider]  benzonatate (TESSALON) 200 MG capsule Take 200 mg by mouth 3 (three) times daily as needed for cough.   Yes [provider]  Cholecalciferol  (VITAMIN D3) 125 MCG (5000 UT) CAPS Take 5,000 Units by mouth daily.   Yes [provider]  ferrous sulfate 325 (65 FE) MG EC tablet Take  325 mg by mouth daily with breakfast.   Yes [provider]  gabapentin  (NEURONTIN ) 100 MG capsule May take 1 capsule in morning and in afternoon if needed. Patient taking differently: Take 200 mg by mouth in the morning. 07/19/21  Yes Jaffe, Adam R, DO  gabapentin  (NEURONTIN ) 400 MG capsule Take 400 mg by mouth at bedtime.   Yes [provider]  guaiFENesin -codeine 100-10 MG/5ML syrup Take 10 mLs by mouth at bedtime as needed for cough.   Yes [provider]  loratadine (CLARITIN) 10 MG tablet Take 10 mg by mouth at bedtime.   Yes [provider]  Magnesium 250 MG TABS Take 250 mg by mouth in the morning.   Yes [provider]  metaxalone (SKELAXIN) 800 MG tablet Take 800 mg by mouth in the morning and at bedtime. 05/18/21  Yes [provider]  Multiple Vitamin (MULTIVITAMIN WITH MINERALS) TABS tablet Take 1 tablet by mouth daily with breakfast.   Yes [provider]  omeprazole  (PRILOSEC) 20 MG capsule Take 20 mg by mouth See admin instructions. Take 20 mg by mouth 30 minutes before breakfast   Yes [provider]  oxyCODONE  (ROXICODONE ) 5 MG immediate release tablet Take 1 tablet (5 mg total) by mouth every 4 (four) hours as needed for severe pain. 02/03/21  Yes Smoot, Sarah A, PA-C  potassium gluconate 595 (99 K) MG TABS tablet Take 595 mg by mouth at bedtime.   Yes [provider]  TYLENOL  500 MG tablet Take 1,000 mg by mouth every 8 (eight) hours as needed for mild pain (pain score 1-3) (or headaches).   Yes [provider]  Zinc 50 MG TABS Take 50 mg by mouth in the morning.   Yes [provider]  gabapentin  (NEURONTIN ) 300 MG capsule Take 1 capsule (300 mg total) by mouth at bedtime. Patient not taking: Reported on 11/30/2023 07/19/21   Skeet Juliene SAUNDERS, DO     Thank you for this interesting consult. I have spent 30 minutes evaluating patient, reviewing chart, and discussing plan of care with patient,  family, and primary medical team. If you have any questions or concerns please reach out to me via secure chat.  Paula Southerly, MD [facility]

## 2023-12-03 NOTE — Progress Notes (Signed)
 PROGRESS NOTE    Suzanne Kim  FMW:[id number] DOB: [birth year] DOA: 11/30/2023 PCP: [facility]   Brief Narrative:  83 year old female with history of multiple sclerosis, now bedbound presented with worsening shortness of breath and cough.  On presentation, CT of the chest showed a mild right apical pneumothorax with right lower lobe infiltrate and small right pleural effusion.  COVID/influenza/RSV PCR negative.  Pulmonary was consulted.  She was started on IV antibiotics.  Assessment & Plan:   Community-acquired bacterial right lower lobar pneumonia: Bacterial unspecified Acute respiratory failure with hypoxia -Imaging as above.  Currently on high flow nasal cannula.  Continue Rocephin and Zithromax.  Continue nebs - Respiratory panel PCR negative. - Diet as per SLP recommendations.  Small right apical pneumothorax -Pulmonary following.  Chest x-ray on 12/02/2023 showed no pneumothorax.  No need for chest tube at this time.  Continue high flow nasal cannula.  Multiple sclerosis, now bedbound - Fall precautions.  Leukocytosis - Resolved  Thrombocytopenia -Monitor intermittently.  No signs of bleeding.  Obesity class III - Outpatient follow-up   DVT prophylaxis: Lovenox Code Status: DNR Family Communication: Caregiver at bedside Disposition Plan: Status is: Inpatient Remains inpatient appropriate because: Of severity of illness    Consultants: PCCM  Procedures: None  Antimicrobials: Rocephin and Zithromax   Subjective: Patient seen and examined at bedside.  No fever, vomiting, worsening abdominal pain or shortness of breath reported. Objective: Vitals:   12/02/23 1410 12/02/23 2055 12/02/23 2127 12/03/23 0517  BP:  (!) 119/55  (!) 111/52  Pulse:  82  71  Resp:  16  20  Temp:  99.3 F (37.4 C)  98.8 F (37.1 C)  TempSrc:  Oral  Oral  SpO2: 100% 100% 96% 100%  Weight:      Height:        Intake/Output Summary (Last 24 hours) at  12/03/2023 0801 Last data filed at 12/03/2023 0500 Gross per 24 hour  Intake 120 ml  Output 500 ml  Net -380 ml   Filed Weights   12/01/23 1000  Weight: 62.1 kg    Examination:  General: No acute distress.  Currently on high flow nasal cannula oxygen.  Chronically ill and deconditioned ENT/neck: No palpable neck masses or elevated JVD noted respiratory: Bilateral decreased breath sounds at bases with scattered crackles  CVS: Rate mostly controlled; S1 and S2 are heard Abdominal: Soft, nontender, distended mildly; no organomegaly, bowel sounds are heard normally Extremities: No clubbing; mild lower extremity edema present  CNS: Alert and oriented.  Still slow to respond.  Poor historian.   Lymph: No obvious palpable lymphadenopathy Skin: No obvious petechiae/rashes psych: Affect remains flat.  Not agitated currently.     Data Reviewed: I have personally reviewed following labs and imaging studies  CBC: Recent Labs  Lab 11/30/23 1115 12/01/23 0546  WBC 12.5* 7.3  NEUTROABS 10.3*  --   HGB 14.7 13.4  HCT 46.2* 42.1  MCV 94.9 93.1  PLT 126* 91*   Basic Metabolic Panel: Recent Labs  Lab 11/30/23 1115 12/01/23 0546  NA 140 142  K 4.5 3.9  CL 104 106  CO2 27 26  GLUCOSE 114* 97  BUN 17 22  CREATININE 1.03* 0.90  CALCIUM 9.7 9.5   GFR: Estimated Creatinine Clearance: 46.9 mL/min (by C-G formula based on SCr of 0.9 mg/dL). Liver Function Tests: Recent Labs  Lab 12/01/23 0546  AST 19  ALT 11  ALKPHOS 76  BILITOT 0.4  PROT 6.2*  ALBUMIN  3.4*   No results for input(s): LIPASE, AMYLASE in the last 168 hours. No results for input(s): AMMONIA in the last 168 hours. Coagulation Profile: No results for input(s): INR, PROTIME in the last 168 hours. Cardiac Enzymes: No results for input(s): CKTOTAL, CKMB, CKMBINDEX, TROPONINI in the last 168 hours. BNP (last 3 results) Recent Labs    11/30/23 1115  PROBNP 496.0*   HbA1C: No results for  input(s): HGBA1C in the last 72 hours. CBG: No results for input(s): GLUCAP in the last 168 hours. Lipid Profile: No results for input(s): CHOL, HDL, LDLCALC, TRIG, CHOLHDL, LDLDIRECT in the last 72 hours. Thyroid  Function Tests: No results for input(s): TSH, T4TOTAL, FREET4, T3FREE, THYROIDAB in the last 72 hours. Anemia Panel: No results for input(s): VITAMINB12, FOLATE, FERRITIN, TIBC, IRON, RETICCTPCT in the last 72 hours. Sepsis Labs: No results for input(s): PROCALCITON, LATICACIDVEN in the last 168 hours.  Recent Results (from the past 240 hours)  Resp panel by RT-PCR (RSV, Flu A&B, Covid) Anterior Nasal Swab     Status: None   Collection Time: 11/30/23 12:29 PM   Specimen: Anterior Nasal Swab  Result Value Ref Range Status   SARS Coronavirus 2 by RT PCR NEGATIVE NEGATIVE Final    Comment: (NOTE) SARS-CoV-2 target nucleic acids are NOT DETECTED.  The SARS-CoV-2 RNA is generally detectable in upper respiratory specimens during the acute phase of infection. The lowest concentration of SARS-CoV-2 viral copies this assay can detect is 138 copies/mL. A negative result does not preclude SARS-Cov-2 infection and should not be used as the sole basis for treatment or other patient management decisions. A negative result may occur with  improper specimen collection/handling, submission of specimen other than nasopharyngeal swab, presence of viral mutation(s) within the areas targeted by this assay, and inadequate number of viral copies(<138 copies/mL). A negative result must be combined with clinical observations, patient history, and epidemiological information. The expected result is Negative.  Fact Sheet for Patients:  bloggercourse.com  Fact Sheet for Healthcare Providers:  seriousbroker.it  This test is no t yet approved or cleared by the [place]  FDA and  has been authorized for  detection and/or diagnosis of SARS-CoV-2 by FDA under an Emergency Use Authorization (EUA). This EUA will remain  in effect (meaning this test can be used) for the duration of the COVID-19 declaration under Section 564(b)(1) of the Act, 21 U.S.C.section 360bbb-3(b)(1), unless the authorization is terminated  or revoked sooner.       Influenza A by PCR NEGATIVE NEGATIVE Final   Influenza B by PCR NEGATIVE NEGATIVE Final    Comment: (NOTE) The Xpert Xpress SARS-CoV-2/FLU/RSV plus assay is intended as an aid in the diagnosis of influenza from Nasopharyngeal swab specimens and should not be used as a sole basis for treatment. Nasal washings and aspirates are unacceptable for Xpert Xpress SARS-CoV-2/FLU/RSV testing.  Fact Sheet for Patients: bloggercourse.com  Fact Sheet for Healthcare Providers: seriousbroker.it  This test is not yet approved or cleared by the [place]  FDA and has been authorized for detection and/or diagnosis of SARS-CoV-2 by FDA under an Emergency Use Authorization (EUA). This EUA will remain in effect (meaning this test can be used) for the duration of the COVID-19 declaration under Section 564(b)(1) of the Act, 21 U.S.C. section 360bbb-3(b)(1), unless the authorization is terminated or revoked.     Resp Syncytial Virus by PCR NEGATIVE NEGATIVE Final    Comment: (NOTE) Fact Sheet for Patients: bloggercourse.com  Fact Sheet for Healthcare Providers: seriousbroker.it  This test is not yet approved or cleared by the [place]  FDA and has been authorized for detection and/or diagnosis of SARS-CoV-2 by FDA under an Emergency Use Authorization (EUA). This EUA will remain in effect (meaning this test can be used) for the duration of the COVID-19 declaration under Section 564(b)(1) of the Act, 21 U.S.C. section 360bbb-3(b)(1), unless the authorization is  terminated or revoked.  Performed at [facility], [address], [place], [place] [address]   Respiratory (~20 pathogens) panel by PCR     Status: None   Collection Time: 11/30/23 12:29 PM   Specimen: Nasopharyngeal Swab; Respiratory  Result Value Ref Range Status   Adenovirus NOT DETECTED NOT DETECTED Final   Coronavirus 229E NOT DETECTED NOT DETECTED Final    Comment: (NOTE) The Coronavirus on the Respiratory Panel, DOES NOT test for the novel  Coronavirus (2019 nCoV)    Coronavirus HKU1 NOT DETECTED NOT DETECTED Final   Coronavirus NL63 NOT DETECTED NOT DETECTED Final   Coronavirus OC43 NOT DETECTED NOT DETECTED Final   Metapneumovirus NOT DETECTED NOT DETECTED Final   Rhinovirus / Enterovirus NOT DETECTED NOT DETECTED Final   Influenza A NOT DETECTED NOT DETECTED Final   Influenza B NOT DETECTED NOT DETECTED Final   Parainfluenza Virus 1 NOT DETECTED NOT DETECTED Final   Parainfluenza Virus 2 NOT DETECTED NOT DETECTED Final   Parainfluenza Virus 3 NOT DETECTED NOT DETECTED Final   Parainfluenza Virus 4 NOT DETECTED NOT DETECTED Final   Respiratory Syncytial Virus NOT DETECTED NOT DETECTED Final   Bordetella pertussis NOT DETECTED NOT DETECTED Final   Bordetella Parapertussis NOT DETECTED NOT DETECTED Final   Chlamydophila pneumoniae NOT DETECTED NOT DETECTED Final   Mycoplasma pneumoniae NOT DETECTED NOT DETECTED Final    Comment: Performed at [facility], [address] [place], [place] [address]         Radiology Studies: DG CHEST PORT 1 VIEW Result Date: 12/02/2023 EXAM: 1 VIEW(S) XRAY OF THE CHEST 12/02/2023 05:46:00 AM COMPARISON: Portable chest dated 12/01/2023 05:22:00 AM. CLINICAL HISTORY: Pneumothorax. FINDINGS: LIMITATIONS/ARTIFACTS: Limited assessment for pneumothorax given patient kyphosis and moderate rotation to the right. LUNGS AND PLEURA: There is increased opacity in the right apex which could be due to a new infiltrate,  mucous plugging with atelectasis, or loculated fluid. There is a small left pleural effusion. Perihilar atelectatic bands are present with dense consolidation in the right lower lobe. The hypoexpanded lungs are clear Elsewhere. No pneumothorax. HEART AND MEDIASTINUM: There is stable cardiomegaly, large hiatal hernia, aortic tortuosity. Aortic atherosclerosis. BONES AND SOFT TISSUES: No acute osseous abnormality. IMPRESSION: 1. No visible pneumothorax, but limited exam due to positioning and kyphosis . 2. Dense consolidation in the right lower lobe. 3. Increased opacity in the right apex, which may represent new infiltrate, mucous plugging with atelectasis, or loculated fluid. 4. Small left pleural effusion. Electronically signed by: Francis Quam MD 12/02/2023 06:05 AM EST RP Workstation: [id number]   DG Swallowing Func-Speech Pathology Result Date: 12/01/2023 Table formatting from the original result was not included. Modified Barium Swallow Study Patient Details Name: Suzanne Kim MRN: [id number] Date of Birth: [birth year] Today's Date: 12/01/2023 HPI/PMH: HPI: 83 year old female with advanced MS, bedbound, admitted with PNA. CT of the chest which revealed a mild right apical pneumothorax with right lower lobe infiltrate and small right pleural effusion.  The patient was COVID, flu, and RSV negative. Clinical Impression: Clinical Impression: Patient presents with a mild pharyngeal phase  dysphagia characterized by decreased laryngeal closure with resultant trace and intermittent penetration of thin liquids. View below the vocal cords difficulty due to shadows from shoulders/posture although penetration did not appear to reach below the level of the vocal cords, often remaining above and lifting with cued throat clears and subsequent swallows. Pharyngeal clearance normal, no significant esophageal deficits noted although cannot r/o during today's study, particularly in light of c/o intermittent globus with  solids. Defer possible esophagram to MD if feel necessary. SLP will f/u briefly at bedside to reinforce safe swallowing strategies. Factors that may increase risk of adverse event in presence of aspiration Noe & Lianne 2021): Factors that may increase risk of adverse event in presence of aspiration Noe & Lianne 2021): Limited mobility; Frail or deconditioned Recommendations/Plan: Swallowing Evaluation Recommendations Swallowing Evaluation Recommendations Recommendations: PO diet PO Diet Recommendation: Regular; Thin liquids (Level 0) Liquid Administration via: Cup; Straw Medication Administration: Whole meds with liquid Supervision: Staff to assist with self-feeding Swallowing strategies  : Slow rate; Small bites/sips; Clear throat intermittently Postural changes: Position pt fully upright for meals Oral care recommendations: Oral care BID (2x/day) Treatment Plan Treatment Plan Treatment recommendations: Therapy as outlined in treatment plan below Follow-up recommendations: No SLP follow up Functional status assessment: Patient has had a recent decline in their functional status and demonstrates the ability to make significant improvements in function in a reasonable and predictable amount of time. Treatment frequency: Min 1x/week Treatment duration: 1 week Interventions: Aspiration precaution training; Compensatory techniques; Patient/family education; Diet toleration management by SLP Recommendations Recommendations for follow up therapy are one component of a multi-disciplinary discharge planning process, led by the attending physician.  Recommendations may be updated based on patient status, additional functional criteria and insurance authorization. Assessment: Orofacial Exam: Orofacial Exam Oral Cavity - Dentition: Adequate natural dentition Orofacial Anatomy: WFL Oral Motor/Sensory Function: WFL Anatomy: No data recorded Boluses Administered: Boluses Administered Boluses Administered: Thin liquids  (Level 0); Puree; Solid  Oral Impairment Domain: Oral Impairment Domain Lip Closure: No labial escape Tongue control during bolus hold: Cohesive bolus between tongue to palatal seal Bolus preparation/mastication: Timely and efficient chewing and mashing Bolus transport/lingual motion: Brisk tongue motion Oral residue: Complete oral clearance Location of oral residue : N/A Initiation of pharyngeal swallow : Valleculae; Pyriform sinuses  Pharyngeal Impairment Domain: Pharyngeal Impairment Domain Soft palate elevation: No bolus between soft palate (SP)/pharyngeal wall (PW) Laryngeal elevation: Complete superior movement of thyroid  cartilage with complete approximation of arytenoids to epiglottic petiole Anterior hyoid excursion: Complete anterior movement Epiglottic movement: Complete inversion Laryngeal vestibule closure: Incomplete, narrow column air/contrast in laryngeal vestibule (intermittent) Pharyngeal stripping wave : -- (difficult to view) Pharyngeal contraction (A/P view only): N/A Pharyngoesophageal segment opening: Complete distension and complete duration, no obstruction of flow Tongue base retraction: No contrast between tongue base and posterior pharyngeal wall (PPW) Pharyngeal residue: Trace residue within or on pharyngeal structures Location of pharyngeal residue: Valleculae; Pyriform sinuses  Esophageal Impairment Domain: No data recorded Pill: Pill Consistency administered: Thin liquids (Level 0) Thin liquids (Level 0): Impaired (see clinical impressions) Penetration/Aspiration Scale Score: Penetration/Aspiration Scale Score 1.  Material does not enter airway: Puree; Solid; Pill 3.  Material enters airway, remains ABOVE vocal cords and not ejected out: Thin liquids (Level 0) Compensatory Strategies: No data recorded  General Information: No data recorded Diet Prior to this Study: Thin liquids (Level 0) (full liquids)   Temperature : Febrile   Respiratory Status: WFL   Supplemental O2: Nasal cannula    History  of Recent Intubation: No  Behavior/Cognition: Alert; Pleasant mood; Cooperative Self-Feeding Abilities: Needs assist with self-feeding Baseline vocal quality/speech: Normal Volitional Cough: Able to elicit Volitional Swallow: Able to elicit Exam Limitations: Poor positioning; Limited visibility Goal Planning: No data recorded No data recorded No data recorded No data recorded Consulted and agree with results and recommendations: Patient; Nurse Pain: Pain Assessment Pain Assessment: No/denies pain End of Session: Start Time:SLP Start Time (ACUTE ONLY): 1235 Stop Time: SLP Stop Time (ACUTE ONLY): 1250 Time Calculation:SLP Time Calculation (min) (ACUTE ONLY): 15 min Charges: SLP Evaluations $ SLP Speech Visit: 1 Visit SLP Evaluations $BSS Swallow: 1 Procedure $MBS Swallow: 1 Procedure SLP visit diagnosis: SLP Visit Diagnosis: Dysphagia, pharyngeal phase (R13.13) Past Medical History: Past Medical History: Diagnosis Date  Hiatal hernia   Multiple sclerosis 1988 Past Surgical History: Past Surgical History: Procedure Laterality Date  BREAST ENHANCEMENT SURGERY  1975  ESOPHAGOGASTRODUODENOSCOPY (EGD) WITH PROPOFOL  N/A 05/12/2016  Procedure: ESOPHAGOGASTRODUODENOSCOPY (EGD) WITH PROPOFOL ;  Surgeon: Lamar Bunk, MD;  Location: [facility];  Service: Endoscopy;  Laterality: N/A;  ESOPHAGOGASTRODUODENOSCOPY (EGD) WITH PROPOFOL  N/A 01/23/2018  Procedure: ESOPHAGOGASTRODUODENOSCOPY (EGD) WITH PROPOFOL ;  Surgeon: Avram Lupita BRAVO, MD;  Location: [facility];  Service: Endoscopy;  Laterality: N/A;  TONSILLECTOMY    TOTAL VAGINAL HYSTERECTOMY    WISDOM TOOTH EXTRACTION   Leah McCoy MA, CCC-SLP McCoy Leah Meryl 12/01/2023, 12:58 PM       Scheduled Meds:  enoxaparin (LOVENOX) injection  30 mg Subcutaneous Q24H   gabapentin   200 mg Oral Daily   gabapentin   400 mg Oral QHS   ipratropium-albuterol  3 mL Nebulization TID   metaxalone  800 mg Oral BID   pantoprazole   40 mg Oral Daily   sodium chloride  flush  3 mL  Intravenous Q12H   sodium chloride  HYPERTONIC  4 mL Nebulization TID   Continuous Infusions:  azithromycin 500 mg (12/02/23 1755)   cefTRIAXone (ROCEPHIN)  IV 1 g (12/02/23 1346)          Sophie Mao, MD [facility] 12/03/2023, 8:01 AM

## 2023-12-04 ENCOUNTER — Inpatient Hospital Stay (HOSPITAL_COMMUNITY)

## 2023-12-04 ENCOUNTER — Encounter (HOSPITAL_COMMUNITY): Payer: Self-pay | Admitting: Internal Medicine

## 2023-12-04 DIAGNOSIS — J9311 Primary spontaneous pneumothorax: Secondary | ICD-10-CM

## 2023-12-04 DIAGNOSIS — J9312 Secondary spontaneous pneumothorax: Secondary | ICD-10-CM | POA: Diagnosis not present

## 2023-12-04 DIAGNOSIS — R0989 Other specified symptoms and signs involving the circulatory and respiratory systems: Secondary | ICD-10-CM

## 2023-12-04 DIAGNOSIS — K449 Diaphragmatic hernia without obstruction or gangrene: Secondary | ICD-10-CM

## 2023-12-04 DIAGNOSIS — G35D Multiple sclerosis, unspecified: Secondary | ICD-10-CM

## 2023-12-04 MED ORDER — CEFUROXIME AXETIL 500 MG PO TABS: 500.0000 mg | ORAL_TABLET | Freq: Two times a day (BID) | ORAL | 0 refills | Status: AC

## 2023-12-04 MED ORDER — SENNA 8.6 MG PO TABS: 1.0000 | ORAL_TABLET | Freq: Two times a day (BID) | ORAL | 0 refills | Status: AC

## 2023-12-04 MED ORDER — GUAIFENESIN ER 600 MG PO TB12: 600.0000 mg | ORAL_TABLET | Freq: Two times a day (BID) | ORAL | 0 refills | Status: AC

## 2023-12-04 MED ORDER — GABAPENTIN 100 MG PO CAPS: 200.0000 mg | ORAL_CAPSULE | Freq: Every morning | ORAL | Status: AC

## 2023-12-04 MED ORDER — POLYETHYLENE GLYCOL 3350 17 G PO PACK: 17.0000 g | PACK | Freq: Every day | ORAL | 0 refills | Status: AC | PRN

## 2023-12-04 MED ORDER — OXYCODONE HCL 5 MG PO TABS: 5.0000 mg | ORAL_TABLET | ORAL | 0 refills | Status: AC | PRN

## 2023-12-04 MED ORDER — ORAL CARE MOUTH RINSE: 15.0000 mL | OROMUCOSAL | Status: DC | PRN

## 2023-12-04 NOTE — Progress Notes (Signed)
 Speech Language Pathology Treatment: Dysphagia  Patient Details Name: Suzanne Kim MRN: [id number] DOB: January 10, 1941 Today's Date: 12/04/2023 Time: 9058-9049 SLP Time Calculation (min) (ACUTE ONLY): 9 min  Assessment / Plan / Recommendation Clinical Impression  Patient was alert and awake with her caregiver at bedside. Today's session focused on education with patient and her caregiver. Patient shared with SLP about the swallowing strategies she has been implementing from MBS results on 11/14. Patient has been using slow rate, small sips/bites, and intermittent throat clear. Patient shared she has made others aware of these strategies by the sign above her bed. Patient shared sign was helpful and SLP provided patient with more swallow precautions signs she can take home to place around at home.  SLP discussed the importance of these strategies at home and patient and caregiver were in agreement and shared understanding. SLP is signing off at this time.    HPI HPI: 83 year old female with advanced MS, bedbound, admitted with PNA. CT of the chest which revealed a mild right apical pneumothorax with right lower lobe infiltrate and small right pleural effusion.  The patient was COVID, flu, and RSV negative. MBS on 11/14 showed patient has mild pharyngeal phase dysphagia characterized by intermittent penetration of thin liquids.      SLP Plan  All goals met          Recommendations  Diet recommendations: Thin liquid;Regular Medication Administration: Whole meds with puree Supervision: Intermittent supervision to cue for compensatory strategies Compensations: Slow rate;Small sips/bites;Clear throat intermittently Postural Changes and/or Swallow Maneuvers: Seated upright 90 degrees;Upright 30-60 min after meal                  Oral care BID     Dysphagia, pharyngeal phase (R13.13)     All goals met    Damien Hy  Graduate SLP Clinican

## 2023-12-04 NOTE — Progress Notes (Addendum)
 Discharge instructions given to patient, IV removed, and patient discharged home by [facility]. Orvil (patients friend) notified that [facility] has picked patient up per patients request & Orvil stated she will be at the home when patient arrives.

## 2023-12-04 NOTE — TOC Transition Note (Addendum)
 Transition of Care [facility]) - Discharge Note   Patient Details  Name: Suzanne Kim MRN: [id number] Date of Birth: Apr 14, 1940  [facility] CM/SW Contact:  Bascom Service, RN Phone Number: 12/04/2023, 11:50 AM   Clinical Narrative: Spoke to [name](firend over 40 years) patient defers to -has appt @ 2p-[name] will call floor once back from appt to patient to d/c by [facility] to home safely-[name] will contact caregivers-24/7 asst. [facility] to be called once Orvil returns from appt today-PTAR forms printed @ nsg station.   -12:37p-I have placed on [facility] schedule for 4p pick up.No further CM needs.    Final next level of care: Home/Self Care Barriers to Discharge: No Barriers Identified   Patient Goals and CMS Choice Patient states their goals for this hospitalization and ongoing recovery are:: Home CMS Medicare.gov Compare Post Acute Care list provided to:: Patient Represenative (must comment) ([name](friend over 40 years)) Choice offered to / list presented to :  [name])      Discharge Placement                       Discharge Plan and Services Additional resources added to the After Visit Summary for     Discharge Planning Services: CM Consult Post Acute Care Choice: Resumption of Svcs/PTA Provider                               Social Drivers of Health (SDOH) Interventions SDOH Screenings   Food Insecurity: No Food Insecurity (11/30/2023)  Housing: Low Risk  (11/30/2023)  Transportation Needs: No Transportation Needs (11/30/2023)  Utilities: Not At Risk (11/30/2023)  Social Connections: Unknown (11/30/2023)  Tobacco Use: Low Risk  (12/01/2023)     Readmission Risk Interventions     No data to display

## 2023-12-04 NOTE — Care Management Important Message (Signed)
 Important Message  Patient Details IM Letter given. Name: Suzanne Kim MRN: [id number] Date of Birth: May 26, 1940   Important Message Given:  Yes - Medicare IM     Melba Ates 12/04/2023, 1:22 PM

## 2023-12-04 NOTE — Discharge Summary (Addendum)
 Physician Discharge Summary  CHERYLIN WAGUESPACK FMW:[id number] DOB: 09-Sep-1940 DOA: 11/30/2023  PCP: [place]  Admit date: 11/30/2023 Discharge date: 12/04/2023  Admitted From: Home Disposition: Home  Recommendations for Outpatient Follow-up:  Follow up with PCP in 1 week with repeat CBC/BMP Follow up in ED if symptoms worsen or new appear   Home Health: No Equipment/Devices: None  Discharge Condition: Guarded CODE STATUS: DNR Diet recommendation: Regular Brief/Interim Summary: 83 year old female with history of multiple sclerosis, now bedbound presented with worsening shortness of breath and cough. On presentation, CT of the chest showed a mild right apical pneumothorax with right lower lobe infiltrate and small right pleural effusion. COVID/influenza/RSV PCR negative. Pulmonary was consulted. She was started on IV antibiotics.  During the hospitalization, her condition has improved.  Her respiratory status is currently stable; pneumothorax is resolved.  Pulmonary has cleared the patient for discharge.  She will be discharged home today on oral Ceftin.  Discharge Diagnoses:   Community-acquired bacterial right lower lobar pneumonia: Bacterial unspecified Acute respiratory failure with hypoxia -Imaging as above.  Initially required high flow nasal cannula oxygen.  Treated with Rocephin and Zithromax.  - Respiratory panel PCR negative. - Diet as per SLP recommendations. -Pulmonary following and has cleared the patient for discharge home today and recommended total 7-day course of antibiotics.  Discharge patient home today on oral Ceftin for 3 more days. -Respiratory status much improved: Currently on room air   Small right apical pneumothorax: Resolved -Pulmonary following.  Chest x-ray on 12/02/2023 showed no pneumothorax.  No need for chest tube at this time.  Initially treated with high flow nasal cannula oxygen.  CT chest done today showed no pneumothorax.   Pulmonary has cleared the patient for discharge.    Multiple sclerosis, now bedbound - Outpatient follow-up   Leukocytosis - Resolved   Thrombocytopenia -Monitor intermittently as an outpatient.  No signs of bleeding.   Discharge Instructions  Discharge Instructions     Diet general   Complete by: As directed    Increase activity slowly   Complete by: As directed       Allergies as of 12/04/2023       Reactions   Epinephrine Palpitations        Medication List     TAKE these medications    albuterol (2.5 MG/3ML) 0.083% nebulizer solution Commonly known as: PROVENTIL Take 2.5 mg by nebulization in the morning, at noon, and at bedtime.   benzonatate 200 MG capsule Commonly known as: TESSALON Take 200 mg by mouth 3 (three) times daily as needed for cough.   cefUROXime 500 MG tablet Commonly known as: CEFTIN Take 1 tablet (500 mg total) by mouth 2 (two) times daily for 3 days.   ferrous sulfate 325 (65 FE) MG EC tablet Take 325 mg by mouth daily with breakfast.   gabapentin  400 MG capsule Commonly known as: NEURONTIN  Take 400 mg by mouth at bedtime. What changed: Another medication with the same name was removed. Continue taking this medication, and follow the directions you see here.   gabapentin  100 MG capsule Commonly known as: NEURONTIN  Take 2 capsules (200 mg total) by mouth in the morning. What changed:  how much to take how to take this when to take this additional instructions Another medication with the same name was removed. Continue taking this medication, and follow the directions you see here.   guaiFENesin  600 MG 12 hr tablet Commonly known as: Mucinex  Take 1 tablet (600 mg total)  by mouth 2 (two) times daily.   guaiFENesin -codeine 100-10 MG/5ML syrup Take 10 mLs by mouth at bedtime as needed for cough.   loratadine 10 MG tablet Commonly known as: CLARITIN Take 10 mg by mouth at bedtime.   Magnesium 250 MG Tabs Take 250 mg by  mouth in the morning.   metaxalone 800 MG tablet Commonly known as: SKELAXIN Take 800 mg by mouth in the morning and at bedtime.   multivitamin with minerals Tabs tablet Take 1 tablet by mouth daily with breakfast.   omeprazole  20 MG capsule Commonly known as: PRILOSEC Take 20 mg by mouth See admin instructions. Take 20 mg by mouth 30 minutes before breakfast   oxyCODONE  5 MG immediate release tablet Commonly known as: Roxicodone  Take 1 tablet (5 mg total) by mouth every 4 (four) hours as needed for severe pain (pain score 7-10).   polyethylene glycol 17 g packet Commonly known as: MiraLax Take 17 g by mouth daily as needed.   potassium gluconate 595 (99 K) MG Tabs tablet Take 595 mg by mouth at bedtime.   senna 8.6 MG Tabs tablet Commonly known as: SENOKOT Take 1 tablet (8.6 mg total) by mouth 2 (two) times daily.   TYLENOL  500 MG tablet Generic drug: acetaminophen  Take 1,000 mg by mouth every 8 (eight) hours as needed for mild pain (pain score 1-3) (or headaches).   Vitamin D3 125 MCG (5000 UT) Caps Take 5,000 Units by mouth daily.   Zinc 50 MG Tabs Take 50 mg by mouth in the morning.          Follow-up Information     [facility] Schedule an appointment as soon as possible for a visit in 1 week(s).   Specialty: Geriatric Medicine Contact information: [address] [place] [address] [phone]                Allergies  Allergen Reactions   Epinephrine Palpitations    Consultations: Pulmonary   Procedures/Studies: CT CHEST WO CONTRAST Result Date: 12/04/2023 EXAM: CT CHEST WITHOUT CONTRAST 12/04/2023 06:19:34 AM TECHNIQUE: CT of the chest was performed without the administration of intravenous contrast. Multiplanar reformatted images are provided for review. Automated exposure control, iterative reconstruction, and/or weight based adjustment of the mA/kV was utilized to reduce the radiation dose to as low as  reasonably achievable. COMPARISON: 11/30/2023 CLINICAL HISTORY: Pneumothorax. FINDINGS: MEDIASTINUM: Heart size is normal. Aortic atherosclerotic calcifications. No pericardial effusion. Moderate-sized hiatal hernia identified. The central airways are patent. LYMPH NODES: Bilateral mediastinal and hilar calcified lymph nodes compatible with prior granulomatous disease. No axillary lymphadenopathy. LUNGS AND PLEURA: There is increased interlobular septal thickening most severe within the right upper lobe. Hyperdense material within the right lower lobe airways again noted, which may reflect aspirated contrast material versus calcifications in the setting of chronic granulomatous disease. Interval resolution of bilateral pneumothoraces. Small bilateral pleural effusions are again noted, right greater than left. Chronic Bilateral lower lobes consolidating changes with air bronchograms are not significantly improved in the interval. Persistent atelectasis involving the right middle lobe and lingula. Small scattered lung nodules are identified within the posterior right upper lobe nodules which appear new from the previous exam and are favored to represent sequelae of inflammation or infection. The index nodule measures 4 mm, image 36/10. Within the lateral right upper lobe, there is a nodule measuring 4 mm, image 39/10. SOFT TISSUES/BONES: The bones appear diffusely osteopenic. Kyphoscoliosis deformity is again noted involving the thoracolumbar spine.  No acute abnormality of the soft tissues. UPPER ABDOMEN: Calcified granulomas identified within the liver and spleen. Unchanged Bosniak class 1 cyst off the upper pole of the right kidney measuring 1.5 cm, image 88/5. No follow-up imaging recommended. Left adrenal gland adenoma measures 1.5 cm and negative 13 Hounsfield units, image 95/5. No follow-up imaging recommended. IMPRESSION: 1. Interval resolution of bilateral pneumothoraces. 2. Small bilateral pleural effusions,  right greater than left. 3. Bilateral lower lobe consolidation with air bronchograms, not significantly improved. 4. New small right upper lobe pulmonary nodules up to 4 mm, favored inflammatory or infectious etiology.In a patient who was at low risk, no further follow-up is indicated. If the patient is at increased risk a follow-up CT of the chest without contrast material in 12 months may be considered Electronically signed by: Waddell Calk MD 12/04/2023 06:52 AM EST RP Workstation: [id number]   DG CHEST PORT 1 VIEW Result Date: 12/03/2023 EXAM: 1 VIEW(S) XRAY OF THE CHEST 12/03/2023 12:29:00 PM COMPARISON: AP radiograph chest dated 12/02/2023. CLINICAL HISTORY: [id number] Pneumothorax [id number] Pneumothorax. FINDINGS: LUNGS AND PLEURA: Low lung volumes. Hazy and streaky opacities in the lung bases. No pleural effusion. No pneumothorax. HEART AND MEDIASTINUM: There is a mild-to-moderate hiatus hernia. There is calcification within the aortic arch. BONES AND SOFT TISSUES: The patient is kyphotic. No acute osseous abnormality. IMPRESSION: 1. No discrete pneumothorax demonstrated. 2. Low lung volumes and hazy and streaky opacities in the lung bases. 3. Mild-to-moderate hiatus hernia. Electronically signed by: Evalene Coho MD 12/03/2023 12:45 PM EST RP Workstation: [id number]   DG CHEST PORT 1 VIEW Result Date: 12/02/2023 EXAM: 1 VIEW(S) XRAY OF THE CHEST 12/02/2023 05:46:00 AM COMPARISON: Portable chest dated 12/01/2023 05:22:00 AM. CLINICAL HISTORY: Pneumothorax. FINDINGS: LIMITATIONS/ARTIFACTS: Limited assessment for pneumothorax given patient kyphosis and moderate rotation to the right. LUNGS AND PLEURA: There is increased opacity in the right apex which could be due to a new infiltrate, mucous plugging with atelectasis, or loculated fluid. There is a small left pleural effusion. Perihilar atelectatic bands are present with dense consolidation in the right lower lobe. The hypoexpanded lungs are clear  Elsewhere. No pneumothorax. HEART AND MEDIASTINUM: There is stable cardiomegaly, large hiatal hernia, aortic tortuosity. Aortic atherosclerosis. BONES AND SOFT TISSUES: No acute osseous abnormality. IMPRESSION: 1. No visible pneumothorax, but limited exam due to positioning and kyphosis . 2. Dense consolidation in the right lower lobe. 3. Increased opacity in the right apex, which may represent new infiltrate, mucous plugging with atelectasis, or loculated fluid. 4. Small left pleural effusion. Electronically signed by: Francis Quam MD 12/02/2023 06:05 AM EST RP Workstation: [id number]   DG Swallowing Func-Speech Pathology Result Date: 12/01/2023 Table formatting from the original result was not included. Modified Barium Swallow Study Patient Details Name: Suzanne Kim MRN: [id number] Date of Birth: [birth year] Today's Date: 12/01/2023 HPI/PMH: HPI: 83 year old female with advanced MS, bedbound, admitted with PNA. CT of the chest which revealed a mild right apical pneumothorax with right lower lobe infiltrate and small right pleural effusion.  The patient was COVID, flu, and RSV negative. Clinical Impression: Clinical Impression: Patient presents with a mild pharyngeal phase dysphagia characterized by decreased laryngeal closure with resultant trace and intermittent penetration of thin liquids. View below the vocal cords difficulty due to shadows from shoulders/posture although penetration did not appear to reach below the level of the vocal cords, often remaining above and lifting with cued throat clears and subsequent swallows. Pharyngeal clearance normal, no significant esophageal deficits noted although cannot r/o  during today's study, particularly in light of c/o intermittent globus with solids. Defer possible esophagram to MD if feel necessary. SLP will f/u briefly at bedside to reinforce safe swallowing strategies. Factors that may increase risk of adverse event in presence of aspiration Noe &  Lianne 2021): Factors that may increase risk of adverse event in presence of aspiration Noe & Lianne 2021): Limited mobility; Frail or deconditioned Recommendations/Plan: Swallowing Evaluation Recommendations Swallowing Evaluation Recommendations Recommendations: PO diet PO Diet Recommendation: Regular; Thin liquids (Level 0) Liquid Administration via: Cup; Straw Medication Administration: Whole meds with liquid Supervision: Staff to assist with self-feeding Swallowing strategies  : Slow rate; Small bites/sips; Clear throat intermittently Postural changes: Position pt fully upright for meals Oral care recommendations: Oral care BID (2x/day) Treatment Plan Treatment Plan Treatment recommendations: Therapy as outlined in treatment plan below Follow-up recommendations: No SLP follow up Functional status assessment: Patient has had a recent decline in their functional status and demonstrates the ability to make significant improvements in function in a reasonable and predictable amount of time. Treatment frequency: Min 1x/week Treatment duration: 1 week Interventions: Aspiration precaution training; Compensatory techniques; Patient/family education; Diet toleration management by SLP Recommendations Recommendations for follow up therapy are one component of a multi-disciplinary discharge planning process, led by the attending physician.  Recommendations may be updated based on patient status, additional functional criteria and insurance authorization. Assessment: Orofacial Exam: Orofacial Exam Oral Cavity - Dentition: Adequate natural dentition Orofacial Anatomy: WFL Oral Motor/Sensory Function: WFL Anatomy: No data recorded Boluses Administered: Boluses Administered Boluses Administered: Thin liquids (Level 0); Puree; Solid  Oral Impairment Domain: Oral Impairment Domain Lip Closure: No labial escape Tongue control during bolus hold: Cohesive bolus between tongue to palatal seal Bolus preparation/mastication:  Timely and efficient chewing and mashing Bolus transport/lingual motion: Brisk tongue motion Oral residue: Complete oral clearance Location of oral residue : N/A Initiation of pharyngeal swallow : Valleculae; Pyriform sinuses  Pharyngeal Impairment Domain: Pharyngeal Impairment Domain Soft palate elevation: No bolus between soft palate (SP)/pharyngeal wall (PW) Laryngeal elevation: Complete superior movement of thyroid  cartilage with complete approximation of arytenoids to epiglottic petiole Anterior hyoid excursion: Complete anterior movement Epiglottic movement: Complete inversion Laryngeal vestibule closure: Incomplete, narrow column air/contrast in laryngeal vestibule (intermittent) Pharyngeal stripping wave : -- (difficult to view) Pharyngeal contraction (A/P view only): N/A Pharyngoesophageal segment opening: Complete distension and complete duration, no obstruction of flow Tongue base retraction: No contrast between tongue base and posterior pharyngeal wall (PPW) Pharyngeal residue: Trace residue within or on pharyngeal structures Location of pharyngeal residue: Valleculae; Pyriform sinuses  Esophageal Impairment Domain: No data recorded Pill: Pill Consistency administered: Thin liquids (Level 0) Thin liquids (Level 0): Impaired (see clinical impressions) Penetration/Aspiration Scale Score: Penetration/Aspiration Scale Score 1.  Material does not enter airway: Puree; Solid; Pill 3.  Material enters airway, remains ABOVE vocal cords and not ejected out: Thin liquids (Level 0) Compensatory Strategies: No data recorded  General Information: No data recorded Diet Prior to this Study: Thin liquids (Level 0) (full liquids)   Temperature : Febrile   Respiratory Status: WFL   Supplemental O2: Nasal cannula   History of Recent Intubation: No  Behavior/Cognition: Alert; Pleasant mood; Cooperative Self-Feeding Abilities: Needs assist with self-feeding Baseline vocal quality/speech: Normal Volitional Cough: Able to  elicit Volitional Swallow: Able to elicit Exam Limitations: Poor positioning; Limited visibility Goal Planning: No data recorded No data recorded No data recorded No data recorded Consulted and agree with results and recommendations: Patient; Nurse Pain: Pain Assessment Pain  Assessment: No/denies pain End of Session: Start Time:SLP Start Time (ACUTE ONLY): 1235 Stop Time: SLP Stop Time (ACUTE ONLY): 1250 Time Calculation:SLP Time Calculation (min) (ACUTE ONLY): 15 min Charges: SLP Evaluations $ SLP Speech Visit: 1 Visit SLP Evaluations $BSS Swallow: 1 Procedure $MBS Swallow: 1 Procedure SLP visit diagnosis: SLP Visit Diagnosis: Dysphagia, pharyngeal phase (R13.13) Past Medical History: Past Medical History: Diagnosis Date  Hiatal hernia   Multiple sclerosis 1988 Past Surgical History: Past Surgical History: Procedure Laterality Date  BREAST ENHANCEMENT SURGERY  1975  ESOPHAGOGASTRODUODENOSCOPY (EGD) WITH PROPOFOL  N/A 05/12/2016  Procedure: ESOPHAGOGASTRODUODENOSCOPY (EGD) WITH PROPOFOL ;  Surgeon: Lamar Bunk, MD;  Location: [facility];  Service: Endoscopy;  Laterality: N/A;  ESOPHAGOGASTRODUODENOSCOPY (EGD) WITH PROPOFOL  N/A 01/23/2018  Procedure: ESOPHAGOGASTRODUODENOSCOPY (EGD) WITH PROPOFOL ;  Surgeon: Avram Lupita BRAVO, MD;  Location: [facility];  Service: Endoscopy;  Laterality: N/A;  TONSILLECTOMY    TOTAL VAGINAL HYSTERECTOMY    WISDOM TOOTH EXTRACTION   Leah McCoy MA, CCC-SLP McCoy Leah Meryl 12/01/2023, 12:58 PM  DG CHEST PORT 1 VIEW Result Date: 12/01/2023 EXAM: 1 VIEW(S) XRAY OF THE CHEST 12/01/2023 05:32:00 AM COMPARISON: Chest CT yesterday at 12:45 pm. CLINICAL HISTORY: Pneumothorax. FINDINGS: LUNGS AND PLEURA: Small right apical pneumothorax estimated at 5% or less of the chest volume although possibly underestimated AP. This is not notably changed from yesterday's portable chest. Small right pleural effusion. The lungs are expiratory. There is dense consolidation in the right lower lobe with  calcifications in the consolidated lung. Coarse atelectatic change noted in the left lower lung field. Remaining lungs are generally clear. Overall aeration seems unchanged. HEART AND MEDIASTINUM: Mild cardiomegaly. Stable mediastinum with aortic atherosclerosis. Multifocal calcified mediastinal and hilar lymph nodes. BONES AND SOFT TISSUES: No acute osseous abnormality. Large hiatal hernia. IMPRESSION: 1. Small right apical pneumothorax, estimated at 5% or less of chest volume, stable compared to yesterdays exam. 2. Dense consolidation in the right lower lobe with internal calcifications in the consolidated lung. 3. Small right pleural effusion. 4. Coarse atelectatic change in the left lower lung field. 5. Large hiatal hernia. Electronically signed by: Francis Quam MD 12/01/2023 05:49 AM EST RP Workstation: [id number]   CT Chest Wo Contrast Result Date: 11/30/2023 EXAM: CT CHEST WITHOUT CONTRAST 11/30/2023 01:01:07 PM TECHNIQUE: CT of the chest was performed without the administration of intravenous contrast. Multiplanar reformatted images are provided for review. Automated exposure control, iterative reconstruction, and/or weight based adjustment of the mA/kV was utilized to reduce the radiation dose to as low as reasonably achievable. COMPARISON: Radiograph of same day. CLINICAL HISTORY: better eval atelectasis vs infiltrate better eval atelectasis vs infiltrate FINDINGS: MEDIASTINUM: Mild cardiomegaly. Large hiatal hernia. There does appear to be occlusion of the right mainstem bronchus due to aspiration or mucus plugging. LYMPH NODES: Calcified mediastinal lymph nodes are noted suggesting prior granulomatous disease. LUNGS AND PLEURA: Mild right apical and anterior pneumothorax is noted. Small right pleural effusion is noted. Right lower lobe atelectasis or infiltrate. Material is noted in the posterior portion of right lung which may represent either calcification or aspirated material. SOFT  TISSUES/BONES: No acute abnormality of the bones or soft tissues. UPPER ABDOMEN: Limited images of the upper abdomen demonstrates no acute abnormality. IMPRESSION: 1. Mild right apical and anterior pneumothorax. Attempts to call this finding to the ordering provider were unsuccessful as no one answered my phone calls to the emergency department . 2. Small right pleural effusion. 3. Right lower lobe atelectasis or infiltrate. 4. Occlusion of the right mainstem bronchus, possibly  due to aspiration or mucus plugging. 5. Material in the posterior right lung, possibly representing calcification or aspirated material. 6. Calcified mediastinal lymph nodes, suggesting prior granulomatous disease. 7. Mild cardiomegaly. 8. Large hiatal hernia. Electronically signed by: Lynwood Seip MD 11/30/2023 01:49 PM EST RP Workstation: [id number]   DG Chest Port 1 View Result Date: 11/30/2023 CLINICAL DATA:  Cough.  Evaluate for pneumonia. EXAM: PORTABLE CHEST 1 VIEW COMPARISON:  01/23/2018 CT 09/15/2016 FINDINGS: Patient is slightly rotated to the right. Lungs are hypoinflated with mild right basilar opacification which may be due to atelectasis or early infection. Remainder of the lungs are clear. Known moderate size hiatal hernia. Cardiomediastinal silhouette and remainder of the exam is unchanged. IMPRESSION: 1. Hypoinflation with mild right basilar opacification which may be due to atelectasis or early infection. 2. Known moderate size hiatal hernia. Electronically Signed   By: Toribio Agreste M.D.   On: 11/30/2023 11:33      Subjective: Patient seen and examined at bedside.  Feels better and wants to come today.  No fever, vomiting or abdominal pain reported  Discharge Exam: Vitals:   12/04/23 0845 12/04/23 0848  BP:    Pulse:    Resp:    Temp:    SpO2: 99% 99%    General: Pt is alert, awake, not in acute distress.  Elderly female; looks chronically ill and deconditioned. Cardiovascular: rate controlled, S1/S2  + Respiratory: bilateral decreased breath sounds at bases with scattered crackles Abdominal: Soft, NT, ND, bowel sounds + Extremities: Trace lower extremity edema; no cyanosis    The results of significant diagnostics from this hospitalization (including imaging, microbiology, ancillary and laboratory) are listed below for reference.     Microbiology: Recent Results (from the past 240 hours)  Resp panel by RT-PCR (RSV, Flu A&B, Covid) Anterior Nasal Swab     Status: None   Collection Time: 11/30/23 12:29 PM   Specimen: Anterior Nasal Swab  Result Value Ref Range Status   SARS Coronavirus 2 by RT PCR NEGATIVE NEGATIVE Final    Comment: (NOTE) SARS-CoV-2 target nucleic acids are NOT DETECTED.  The SARS-CoV-2 RNA is generally detectable in upper respiratory specimens during the acute phase of infection. The lowest concentration of SARS-CoV-2 viral copies this assay can detect is 138 copies/mL. A negative result does not preclude SARS-Cov-2 infection and should not be used as the sole basis for treatment or other patient management decisions. A negative result may occur with  improper specimen collection/handling, submission of specimen other than nasopharyngeal swab, presence of viral mutation(s) within the areas targeted by this assay, and inadequate number of viral copies(<138 copies/mL). A negative result must be combined with clinical observations, patient history, and epidemiological information. The expected result is Negative.  Fact Sheet for Patients:  bloggercourse.com  Fact Sheet for Healthcare Providers:  seriousbroker.it  This test is no t yet approved or cleared by the [place]  FDA and  has been authorized for detection and/or diagnosis of SARS-CoV-2 by FDA under an Emergency Use Authorization (EUA). This EUA will remain  in effect (meaning this test can be used) for the duration of the COVID-19 declaration  under Section 564(b)(1) of the Act, 21 U.S.C.section 360bbb-3(b)(1), unless the authorization is terminated  or revoked sooner.       Influenza A by PCR NEGATIVE NEGATIVE Final   Influenza B by PCR NEGATIVE NEGATIVE Final    Comment: (NOTE) The Xpert Xpress SARS-CoV-2/FLU/RSV plus assay is intended as an aid in the diagnosis of influenza  from Nasopharyngeal swab specimens and should not be used as a sole basis for treatment. Nasal washings and aspirates are unacceptable for Xpert Xpress SARS-CoV-2/FLU/RSV testing.  Fact Sheet for Patients: bloggercourse.com  Fact Sheet for Healthcare Providers: seriousbroker.it  This test is not yet approved or cleared by the [place]  FDA and has been authorized for detection and/or diagnosis of SARS-CoV-2 by FDA under an Emergency Use Authorization (EUA). This EUA will remain in effect (meaning this test can be used) for the duration of the COVID-19 declaration under Section 564(b)(1) of the Act, 21 U.S.C. section 360bbb-3(b)(1), unless the authorization is terminated or revoked.     Resp Syncytial Virus by PCR NEGATIVE NEGATIVE Final    Comment: (NOTE) Fact Sheet for Patients: bloggercourse.com  Fact Sheet for Healthcare Providers: seriousbroker.it  This test is not yet approved or cleared by the [place]  FDA and has been authorized for detection and/or diagnosis of SARS-CoV-2 by FDA under an Emergency Use Authorization (EUA). This EUA will remain in effect (meaning this test can be used) for the duration of the COVID-19 declaration under Section 564(b)(1) of the Act, 21 U.S.C. section 360bbb-3(b)(1), unless the authorization is terminated or revoked.  Performed at [facility], [address], [place], [place] [address]   Respiratory (~20 pathogens) panel by PCR     Status: None   Collection Time: 11/30/23  12:29 PM   Specimen: Nasopharyngeal Swab; Respiratory  Result Value Ref Range Status   Adenovirus NOT DETECTED NOT DETECTED Final   Coronavirus 229E NOT DETECTED NOT DETECTED Final    Comment: (NOTE) The Coronavirus on the Respiratory Panel, DOES NOT test for the novel  Coronavirus (2019 nCoV)    Coronavirus HKU1 NOT DETECTED NOT DETECTED Final   Coronavirus NL63 NOT DETECTED NOT DETECTED Final   Coronavirus OC43 NOT DETECTED NOT DETECTED Final   Metapneumovirus NOT DETECTED NOT DETECTED Final   Rhinovirus / Enterovirus NOT DETECTED NOT DETECTED Final   Influenza A NOT DETECTED NOT DETECTED Final   Influenza B NOT DETECTED NOT DETECTED Final   Parainfluenza Virus 1 NOT DETECTED NOT DETECTED Final   Parainfluenza Virus 2 NOT DETECTED NOT DETECTED Final   Parainfluenza Virus 3 NOT DETECTED NOT DETECTED Final   Parainfluenza Virus 4 NOT DETECTED NOT DETECTED Final   Respiratory Syncytial Virus NOT DETECTED NOT DETECTED Final   Bordetella pertussis NOT DETECTED NOT DETECTED Final   Bordetella Parapertussis NOT DETECTED NOT DETECTED Final   Chlamydophila pneumoniae NOT DETECTED NOT DETECTED Final   Mycoplasma pneumoniae NOT DETECTED NOT DETECTED Final    Comment: Performed at [facility], [address] [place], [place] [address]     Labs: BNP (last 3 results) No results for input(s): BNP in the last 8760 hours. Basic Metabolic Panel: Recent Labs  Lab 11/30/23 1115 12/01/23 0546  NA 140 142  K 4.5 3.9  CL 104 106  CO2 27 26  GLUCOSE 114* 97  BUN 17 22  CREATININE 1.03* 0.90  CALCIUM 9.7 9.5   Liver Function Tests: Recent Labs  Lab 12/01/23 0546  AST 19  ALT 11  ALKPHOS 76  BILITOT 0.4  PROT 6.2*  ALBUMIN 3.4*   No results for input(s): LIPASE, AMYLASE in the last 168 hours. No results for input(s): AMMONIA in the last 168 hours. CBC: Recent Labs  Lab 11/30/23 1115 12/01/23 0546  WBC 12.5* 7.3  NEUTROABS 10.3*  --   HGB 14.7 13.4  HCT 46.2*  42.1  MCV 94.9 93.1  PLT 126* 91*   Cardiac Enzymes: No results for input(s): CKTOTAL, CKMB, CKMBINDEX, TROPONINI in the last 168 hours. BNP: Invalid input(s): POCBNP CBG: No results for input(s): GLUCAP in the last 168 hours. D-Dimer No results for input(s): DDIMER in the last 72 hours. Hgb A1c No results for input(s): HGBA1C in the last 72 hours. Lipid Profile No results for input(s): CHOL, HDL, LDLCALC, TRIG, CHOLHDL, LDLDIRECT in the last 72 hours. Thyroid  function studies No results for input(s): TSH, T4TOTAL, T3FREE, THYROIDAB in the last 72 hours.  Invalid input(s): FREET3 Anemia work up No results for input(s): VITAMINB12, FOLATE, FERRITIN, TIBC, IRON, RETICCTPCT in the last 72 hours. Urinalysis    Component Value Date/Time   COLORURINE YELLOW 09/15/2016 1140   APPEARANCEUR CLEAR 09/15/2016 1140   LABSPEC 1.023 09/15/2016 1140   PHURINE 5.0 09/15/2016 1140   GLUCOSEU NEGATIVE 09/15/2016 1140   HGBUR MODERATE (A) 09/15/2016 1140   BILIRUBINUR NEGATIVE 09/15/2016 1140   KETONESUR 20 (A) 09/15/2016 1140   PROTEINUR 30 (A) 09/15/2016 1140   NITRITE NEGATIVE 09/15/2016 1140   LEUKOCYTESUR NEGATIVE 09/15/2016 1140   Sepsis Labs Recent Labs  Lab 11/30/23 1115 12/01/23 0546  WBC 12.5* 7.3   Microbiology Recent Results (from the past 240 hours)  Resp panel by RT-PCR (RSV, Flu A&B, Covid) Anterior Nasal Swab     Status: None   Collection Time: 11/30/23 12:29 PM   Specimen: Anterior Nasal Swab  Result Value Ref Range Status   SARS Coronavirus 2 by RT PCR NEGATIVE NEGATIVE Final    Comment: (NOTE) SARS-CoV-2 target nucleic acids are NOT DETECTED.  The SARS-CoV-2 RNA is generally detectable in upper respiratory specimens during the acute phase of infection. The lowest concentration of SARS-CoV-2 viral copies this assay can detect is 138 copies/mL. A negative result does not preclude SARS-Cov-2 infection and should  not be used as the sole basis for treatment or other patient management decisions. A negative result may occur with  improper specimen collection/handling, submission of specimen other than nasopharyngeal swab, presence of viral mutation(s) within the areas targeted by this assay, and inadequate number of viral copies(<138 copies/mL). A negative result must be combined with clinical observations, patient history, and epidemiological information. The expected result is Negative.  Fact Sheet for Patients:  bloggercourse.com  Fact Sheet for Healthcare Providers:  seriousbroker.it  This test is no t yet approved or cleared by the [place]  FDA and  has been authorized for detection and/or diagnosis of SARS-CoV-2 by FDA under an Emergency Use Authorization (EUA). This EUA will remain  in effect (meaning this test can be used) for the duration of the COVID-19 declaration under Section 564(b)(1) of the Act, 21 U.S.C.section 360bbb-3(b)(1), unless the authorization is terminated  or revoked sooner.       Influenza A by PCR NEGATIVE NEGATIVE Final   Influenza B by PCR NEGATIVE NEGATIVE Final    Comment: (NOTE) The Xpert Xpress SARS-CoV-2/FLU/RSV plus assay is intended as an aid in the diagnosis of influenza from Nasopharyngeal swab specimens and should not be used as a sole basis for treatment. Nasal washings and aspirates are unacceptable for Xpert Xpress SARS-CoV-2/FLU/RSV testing.  Fact Sheet for Patients: bloggercourse.com  Fact Sheet for Healthcare Providers: seriousbroker.it  This test is not yet approved or cleared by the [place]  FDA and has been authorized for detection and/or diagnosis of SARS-CoV-2 by FDA under an Emergency Use Authorization (EUA). This EUA will remain in effect (meaning this test can  be used) for the duration of the COVID-19 declaration under  Section 564(b)(1) of the Act, 21 U.S.C. section 360bbb-3(b)(1), unless the authorization is terminated or revoked.     Resp Syncytial Virus by PCR NEGATIVE NEGATIVE Final    Comment: (NOTE) Fact Sheet for Patients: bloggercourse.com  Fact Sheet for Healthcare Providers: seriousbroker.it  This test is not yet approved or cleared by the [place]  FDA and has been authorized for detection and/or diagnosis of SARS-CoV-2 by FDA under an Emergency Use Authorization (EUA). This EUA will remain in effect (meaning this test can be used) for the duration of the COVID-19 declaration under Section 564(b)(1) of the Act, 21 U.S.C. section 360bbb-3(b)(1), unless the authorization is terminated or revoked.  Performed at [facility], [address], [place], [place] [address]   Respiratory (~20 pathogens) panel by PCR     Status: None   Collection Time: 11/30/23 12:29 PM   Specimen: Nasopharyngeal Swab; Respiratory  Result Value Ref Range Status   Adenovirus NOT DETECTED NOT DETECTED Final   Coronavirus 229E NOT DETECTED NOT DETECTED Final    Comment: (NOTE) The Coronavirus on the Respiratory Panel, DOES NOT test for the novel  Coronavirus (2019 nCoV)    Coronavirus HKU1 NOT DETECTED NOT DETECTED Final   Coronavirus NL63 NOT DETECTED NOT DETECTED Final   Coronavirus OC43 NOT DETECTED NOT DETECTED Final   Metapneumovirus NOT DETECTED NOT DETECTED Final   Rhinovirus / Enterovirus NOT DETECTED NOT DETECTED Final   Influenza A NOT DETECTED NOT DETECTED Final   Influenza B NOT DETECTED NOT DETECTED Final   Parainfluenza Virus 1 NOT DETECTED NOT DETECTED Final   Parainfluenza Virus 2 NOT DETECTED NOT DETECTED Final   Parainfluenza Virus 3 NOT DETECTED NOT DETECTED Final   Parainfluenza Virus 4 NOT DETECTED NOT DETECTED Final   Respiratory Syncytial Virus NOT DETECTED NOT DETECTED Final   Bordetella pertussis NOT DETECTED  NOT DETECTED Final   Bordetella Parapertussis NOT DETECTED NOT DETECTED Final   Chlamydophila pneumoniae NOT DETECTED NOT DETECTED Final   Mycoplasma pneumoniae NOT DETECTED NOT DETECTED Final    Comment: Performed at [facility], [address], [place], [place] [address]     Time coordinating discharge: 35 minutes  SIGNED:   Sophie Mao, MD  [facility] 12/04/2023, 11:39 AM

## 2023-12-04 NOTE — Plan of Care (Signed)

## 2023-12-04 NOTE — Progress Notes (Addendum)
   NAME:  Suzanne Kim, MRN:  [id number], DOB:  1940/01/25, LOS: 4 ADMISSION DATE:  11/30/2023, CONSULTATION DATE:  @TODAY @ REFERRING MD:  Dr. Cheryle, CHIEF COMPLAINT:  pneumothorax.   History of Present Illness:  Suzanne Kim is an 83 y/o F with a PMH significant for multiple sclerosis and hiatal hernia who presents for productive cough and dyspnea found to have a small R secondary spontaneous pneumothorax and mucous plugging of BI with atelectasis of RLL. Pulmonology consulted for evaluation and management of the latter.  Pertinent  Medical History   Past Medical History:  Diagnosis Date   Hiatal hernia    Multiple sclerosis 1988    Significant Hospital Events: Including procedures, antibiotic start and stop dates in addition to other pertinent events   11/13 presented with complaints of shortness of breath and cough x 1 month workup revealed likely component of aspiration pneumonia to right lung with small apical pneumothorax to right.  Evaluated patient in the ED with ultrasound and no clear safe window seen for chest tube placement. Started conservative mucociliary clearance measures including chest PT, bronchodilators, and mucolytics. 11/14 pneumothorax stable, continued conservative measures  Interim History / Subjective:   No complaints today.  Feels well Is on room air  Objective    Blood pressure (!) 124/53, pulse 68, temperature 97.8 F (36.6 C), temperature source Oral, resp. rate 18, height 5' 7 (1.702 m), weight 62.1 kg, SpO2 99%.        Intake/Output Summary (Last 24 hours) at 12/04/2023 1117 Last data filed at 12/04/2023 0805 Gross per 24 hour  Intake 453 ml  Output 350 ml  Net 103 ml   Filed Weights   12/01/23 1000  Weight: 62.1 kg    Examination: Contracted extremities Awake, oriented Scattered rhonchi at right base  Resolved problem list   Assessment and Plan  Right spontaneous pneumothorax Right lower lobe pneumonia,  Chronic aspiration  likely exacerbated by severe MS and hiatal hernia CT on 11/17 reviewed with resolution of pneumothorax.  She has persistent right lower lobe consolidation with aspirated material and likely chronic scarring in the area Appreciate speech therapy evaluation.  Dysphagia precaution Continue incentive spirometer.  Start flutter valve, Mucinex  for mucociliary clearance CAP Antimicrobial coverage for 7 days Okay to go home from our perspective.  Signature:   Aniqua Briere MD [facility] See Amion for pager  If no response to pager , please call [phone] until 7pm After 7:00 pm call Elink  [phone] 12/04/2023, 11:18 AM

## 2024-08-14 ENCOUNTER — Ambulatory Visit: Payer: Self-pay | Admitting: Neurology
